# Patient Record
Sex: Female | Born: 1990 | Race: White | Hispanic: No | Marital: Married | Smoking: Never smoker
Health system: Southern US, Community
[De-identification: ages and names within clinical notes are randomized; demographics above are authoritative.]

## PROBLEM LIST (undated history)

## (undated) DIAGNOSIS — K8050 Calculus of bile duct without cholangitis or cholecystitis without obstruction: Secondary | ICD-10-CM

## (undated) DIAGNOSIS — F419 Anxiety disorder, unspecified: Secondary | ICD-10-CM

## (undated) DIAGNOSIS — R55 Syncope and collapse: Secondary | ICD-10-CM

## (undated) HISTORY — PX: WISDOM TOOTH EXTRACTION: SHX21

## (undated) HISTORY — DX: Anxiety disorder, unspecified: F41.9

## (undated) HISTORY — DX: Calculus of bile duct without cholangitis or cholecystitis without obstruction: K80.50

## (undated) HISTORY — DX: Syncope and collapse: R55

---
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
## 2013-09-14 HISTORY — PX: INTRAUTERINE DEVICE (IUD) INSERTION: SHX5877

## 2016-02-05 DIAGNOSIS — B9789 Other viral agents as the cause of diseases classified elsewhere: Secondary | ICD-10-CM | POA: Diagnosis not present

## 2016-02-05 DIAGNOSIS — J069 Acute upper respiratory infection, unspecified: Secondary | ICD-10-CM | POA: Diagnosis not present

## 2016-02-05 DIAGNOSIS — J029 Acute pharyngitis, unspecified: Secondary | ICD-10-CM | POA: Diagnosis not present

## 2016-04-07 ENCOUNTER — Encounter: Payer: Self-pay | Admitting: Adult Health

## 2016-04-07 ENCOUNTER — Ambulatory Visit (INDEPENDENT_AMBULATORY_CARE_PROVIDER_SITE_OTHER): Payer: BLUE CROSS/BLUE SHIELD | Admitting: Adult Health

## 2016-04-07 VITALS — BP 99/67 | HR 100 | Ht 64.25 in | Wt 114.6 lb

## 2016-04-07 DIAGNOSIS — Z23 Encounter for immunization: Secondary | ICD-10-CM | POA: Diagnosis not present

## 2016-04-07 DIAGNOSIS — Z0000 Encounter for general adult medical examination without abnormal findings: Secondary | ICD-10-CM | POA: Insufficient documentation

## 2016-04-07 DIAGNOSIS — R55 Syncope and collapse: Secondary | ICD-10-CM | POA: Diagnosis not present

## 2016-04-07 NOTE — Assessment & Plan Note (Signed)
F/u with Cardiologist as directed. Has been on BB, was d/cd > 1 year ago. Continue am Gatorade and excellent water intake.

## 2016-04-07 NOTE — Patient Instructions (Signed)
Heart-Healthy Eating Plan Many factors influence your heart health, including eating and exercise habits. Heart (coronary) risk increases with abnormal blood fat (lipid) levels. Heart-healthy meal planning includes limiting unhealthy fats, increasing healthy fats, and making other small dietary changes. This includes maintaining a healthy body weight to help keep lipid levels within a normal range. What is my plan? Your health care provider recommends that you:  Get no more than _________% of the total calories in your daily diet from fat.  Limit your intake of saturated fat to less than _________% of your total calories each day.  Limit the amount of cholesterol in your diet to less than _________ mg per day. What types of fat should I choose?  Choose healthy fats more often. Choose monounsaturated and polyunsaturated fats, such as olive oil and canola oil, flaxseeds, walnuts, almonds, and seeds.  Eat more omega-3 fats. Good choices include salmon, mackerel, sardines, tuna, flaxseed oil, and ground flaxseeds. Aim to eat fish at least two times each week.  Limit saturated fats. Saturated fats are primarily found in animal products, such as meats, butter, and cream. Plant sources of saturated fats include palm oil, palm kernel oil, and coconut oil.  Avoid foods with partially hydrogenated oils in them. These contain trans fats. Examples of foods that contain trans fats are stick margarine, some tub margarines, cookies, crackers, and other baked goods. What general guidelines do I need to follow?  Check food labels carefully to identify foods with trans fats or high amounts of saturated fat.  Fill one half of your plate with vegetables and green salads. Eat 4-5 servings of vegetables per day. A serving of vegetables equals 1 cup of raw leafy vegetables,  cup of raw or cooked cut-up vegetables, or  cup of vegetable juice.  Fill one fourth of your plate with whole grains. Look for the word  "whole" as the first word in the ingredient list.  Fill one fourth of your plate with lean protein foods.  Eat 4-5 servings of fruit per day. A serving of fruit equals one medium whole fruit,  cup of dried fruit,  cup of fresh, frozen, or canned fruit, or  cup of 100% fruit juice.  Eat more foods that contain soluble fiber. Examples of foods that contain this type of fiber are apples, broccoli, carrots, beans, peas, and barley. Aim to get 20-30 g of fiber per day.  Eat more home-cooked food and less restaurant, buffet, and fast food.  Limit or avoid alcohol.  Limit foods that are high in starch and sugar.  Avoid fried foods.  Cook foods by using methods other than frying. Baking, boiling, grilling, and broiling are all great options. Other fat-reducing suggestions include:  Removing the skin from poultry.  Removing all visible fats from meats.  Skimming the fat off of stews, soups, and gravies before serving them.  Steaming vegetables in water or broth.  Lose weight if you are overweight. Losing just 5-10% of your initial body weight can help your overall health and prevent diseases such as diabetes and heart disease.  Increase your consumption of nuts, legumes, and seeds to 4-5 servings per week. One serving of dried beans or legumes equals  cup after being cooked, one serving of nuts equals 1 ounces, and one serving of seeds equals  ounce or 1 tablespoon.  You may need to monitor your salt (sodium) intake, especially if you have high blood pressure. Talk with your health care provider or dietitian to get more  more information about reducing sodium. °What foods can I eat? °Grains ° °Breads, including French, white, pita, wheat, raisin, rye, oatmeal, and Italian. Tortillas that are neither fried nor made with lard or trans fat. Low-fat rolls, including hotdog and hamburger buns and English muffins. Biscuits. Muffins. Waffles. Pancakes. Light popcorn. Whole-grain cereals. Flatbread.  Melba toast. Pretzels. Breadsticks. Rusks. Low-fat snacks and crackers, including oyster, saltine, matzo, graham, animal, and rye. Rice and pasta, including brown rice and those that are made with whole wheat. °Vegetables °All vegetables. °Fruits °All fruits, but limit coconut. °Meats and Other Protein Sources °Lean, well-trimmed beef, veal, pork, and lamb. Chicken and turkey without skin. All fish and shellfish. Wild duck, rabbit, pheasant, and venison. Egg whites or low-cholesterol egg substitutes. Dried beans, peas, lentils, and tofu. Seeds and most nuts. °Dairy °Low-fat or nonfat cheeses, including ricotta, string, and mozzarella. Skim or 1% milk that is liquid, powdered, or evaporated. Buttermilk that is made with low-fat milk. Nonfat or low-fat yogurt. °Beverages °Mineral water. Diet carbonated beverages. °Sweets and Desserts °Sherbets and fruit ices. Honey, jam, marmalade, jelly, and syrups. Meringues and gelatins. Pure sugar candy, such as hard candy, jelly beans, gumdrops, mints, marshmallows, and small amounts of dark chocolate. Angel food cake. °Eat all sweets and desserts in moderation. °Fats and Oils °Nonhydrogenated (trans-free) margarines. Vegetable oils, including soybean, sesame, sunflower, olive, peanut, safflower, corn, canola, and cottonseed. Salad dressings or mayonnaise that are made with a vegetable oil. Limit added fats and oils that you use for cooking, baking, salads, and as spreads. °Other °Cocoa powder. Coffee and tea. All seasonings and condiments. °The items listed above may not be a complete list of recommended foods or beverages. Contact your dietitian for more options. °What foods are not recommended? °Grains °Breads that are made with saturated or trans fats, oils, or whole milk. Croissants. Butter rolls. Cheese breads. Sweet rolls. Donuts. Buttered popcorn. Chow mein noodles. High-fat crackers, such as cheese or butter crackers. °Meats and Other Protein Sources °Fatty meats, such  as hotdogs, short ribs, sausage, spareribs, bacon, ribeye roast or steak, and mutton. High-fat deli meats, such as salami and bologna. Caviar. Domestic duck and goose. Organ meats, such as kidney, liver, sweetbreads, brains, gizzard, chitterlings, and heart. °Dairy °Cream, sour cream, cream cheese, and creamed cottage cheese. Whole milk cheeses, including blue (bleu), Monterey Jack, Brie, [place], American, Havarti, Swiss, cheddar, Camembert, and [place]. Whole or 2% milk that is liquid, evaporated, or condensed. Whole buttermilk. Cream sauce or high-fat cheese sauce. Yogurt that is made from whole milk. °Beverages °Regular sodas and drinks with added sugar. °Sweets and Desserts °Frosting. Pudding. Cookies. Cakes other than angel food cake. Candy that has milk chocolate or white chocolate, hydrogenated fat, butter, coconut, or unknown ingredients. Buttered syrups. Full-fat ice cream or ice cream drinks. °Fats and Oils °Gravy that has suet, meat fat, or shortening. Cocoa butter, hydrogenated oils, palm oil, coconut oil, palm kernel oil. These can often be found in baked products, candy, fried foods, nondairy creamers, and whipped toppings. Solid fats and shortenings, including bacon fat, salt pork, lard, and butter. Nondairy cream substitutes, such as coffee creamers and sour cream substitutes. Salad dressings that are made of unknown oils, cheese, or sour cream. °The items listed above may not be a complete list of foods and beverages to avoid. Contact your dietitian for more information. °This information is not intended to replace advice given to you by your health care provider. Make sure you discuss any questions you have with your health care   health care provider. Make sure you discuss any questions you have with your health care provider. Document Released: 11/10/2007 Document Revised: 08/21/2015 Document Reviewed: 07/25/2013 Elsevier Interactive Patient Education  2017 [employer]  Please schedule fasting labs at your convenience. Annual follow-ups, or sooner if needed.

## 2016-04-07 NOTE — Progress Notes (Signed)
Subjective:    Patient ID: Courtney Barnett, female    DOB: 1990/03/31, 26 y.o.   MRN: [id number]  HPI:  Ms. Evelene CroonSantos is here to establish as a new pt.  She is a pleasant, healthy 26 year old.  Only medical hx of vasovagal syndrome, last occurrence was > 1year ago.  Her Cardiologist stopped her BB and she manages condition with am Gatorade and excellent water intake throughout the day.  She denies any current acute complaints at time of encounter.   Patient Care Team    Relationship Specialty Notifications Start End  Malon KindleKaty D Bess, NP PCP - General Family Medicine  04/07/16     Patient Active Problem List   Diagnosis Date Noted  . 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 Healthcare maintenance 04/07/2016     Past Medical History:  Diagnosis Date  . Vasovagal syncope      Past Surgical History:  Procedure Laterality Date  . WISDOM TOOTH EXTRACTION       Family History  Problem Relation Age of Onset  . Healthy Mother   . Healthy Father   . Healthy Brother   . Diabetes Maternal Grandfather   . Healthy Brother   . Healthy Brother      History  Drug Use No     History  Alcohol Use No     History  Smoking Status  . Never Smoker  Smokeless Tobacco  . Never Used     Outpatient Encounter Prescriptions as of 04/07/2016  Medication Sig  . levonorgestrel (MIRENA) 20 MCG/24HR IUD 1 each by Intrauterine route once.   No facility-administered encounter medications on file as of 04/07/2016.     Allergies: Patient has no known allergies.  Body mass index is 19.52 kg/m.  Blood pressure 99/67, pulse 100, height 5' 4.25" (1.632 m), weight 114 lb 9.6 oz (52 kg).     Review of Systems  Constitutional: Negative for activity change, appetite change, chills, diaphoresis, fatigue, fever and unexpected weight change.  HENT: Negative for congestion.   Respiratory: Negative for cough and shortness of breath.   Cardiovascular: Negative for chest pain, palpitations and leg swelling.   Gastrointestinal: Negative for abdominal distention, abdominal pain, blood in stool, constipation, diarrhea, nausea and vomiting.  Endocrine: Negative for cold intolerance, heat intolerance, polydipsia, polyphagia and polyuria.  Genitourinary: Negative for difficulty urinating and flank pain.  Musculoskeletal: Negative for arthralgias, back pain, gait problem, joint swelling, myalgias, neck pain and neck stiffness.  Skin: Negative for color change, pallor, rash and wound.  Neurological: Negative for dizziness, tremors, seizures, syncope, speech difficulty, weakness, light-headedness and headaches.  Psychiatric/Behavioral: Negative for agitation and self-injury. The patient is not nervous/anxious.        Objective:   Physical Exam  Constitutional: She is oriented to person, place, and time. She appears well-developed and well-nourished. No distress.  HENT:  Head: Normocephalic and atraumatic.  Right Ear: External ear normal.  Left Ear: External ear normal.  Eyes: Conjunctivae and EOM are normal. Pupils are equal, round, and reactive to light.  Neck: Normal range of motion. Neck supple.  Cardiovascular: Normal rate, regular rhythm, normal heart sounds and intact distal pulses.   No murmur heard. Pulmonary/Chest: Effort normal and breath sounds normal. No respiratory distress. She has no wheezes. She has no rales. She exhibits no tenderness.  Abdominal: Soft. Bowel sounds are normal. She exhibits no mass. There is no tenderness. There is no rebound and no guarding.  Musculoskeletal: Normal range of motion. She  exhibits no edema, tenderness or deformity.  Lymphadenopathy:    She has no cervical adenopathy.  Neurological: She is alert and oriented to person, place, and time. She has normal reflexes.  Skin: Skin is warm and dry. No rash noted. She is not diaphoretic. No erythema. No pallor.  Psychiatric: She has a normal mood and affect. Her behavior is normal. Judgment and thought content  normal.  Nursing note and vitals reviewed.         Assessment & Plan:   1. Need for influenza vaccination   2. Vasovagal syndrome   3. Healthcare maintenance     Healthcare maintenance Continue excellent diet, regular exercise and healthcare maintenance.  Please schedule fasting labs at your convenience.  Vasovagal syndrome F/u with Cardiologist as directed. Has been on BB, was d/cd > 1 year ago. Continue am Gatorade and excellent water intake.     FOLLOW-UP:  Return in about 1 year (around 04/07/2017) for Regular Follow Up.

## 2016-04-07 NOTE — Assessment & Plan Note (Signed)
Continue excellent diet, regular exercise and healthcare maintenance.  Please schedule fasting labs at your convenience.

## 2016-04-12 ENCOUNTER — Other Ambulatory Visit (INDEPENDENT_AMBULATORY_CARE_PROVIDER_SITE_OTHER): Payer: BLUE CROSS/BLUE SHIELD

## 2016-04-12 DIAGNOSIS — R55 Syncope and collapse: Secondary | ICD-10-CM

## 2016-04-12 DIAGNOSIS — Z0000 Encounter for general adult medical examination without abnormal findings: Secondary | ICD-10-CM

## 2016-04-13 ENCOUNTER — Other Ambulatory Visit: Payer: Self-pay | Admitting: Adult Health

## 2016-04-13 LAB — CBC WITH DIFFERENTIAL/PLATELET
BASOS: 1 %
Basophils Absolute: 0 10*3/uL (ref 0.0–0.2)
EOS (ABSOLUTE): 0.1 10*3/uL (ref 0.0–0.4)
EOS: 2 %
HEMOGLOBIN: 13 g/dL (ref 11.1–15.9)
Hematocrit: 39.2 % (ref 34.0–46.6)
IMMATURE GRANS (ABS): 0 10*3/uL (ref 0.0–0.1)
IMMATURE GRANULOCYTES: 0 %
Lymphocytes Absolute: 1.2 10*3/uL (ref 0.7–3.1)
Lymphs: 28 %
MCH: 29 pg (ref 26.6–33.0)
MCHC: 33.2 g/dL (ref 31.5–35.7)
MCV: 88 fL (ref 79–97)
MONOS ABS: 0.2 10*3/uL (ref 0.1–0.9)
Monocytes: 5 %
NEUTROS PCT: 64 %
Neutrophils Absolute: 2.8 10*3/uL (ref 1.4–7.0)
Platelets: 292 10*3/uL (ref 150–379)
RBC: 4.48 x10E6/uL (ref 3.77–5.28)
RDW: 12.6 % (ref 12.3–15.4)
WBC: 4.3 10*3/uL (ref 3.4–10.8)

## 2016-04-13 LAB — COMPREHENSIVE METABOLIC PANEL
A/G RATIO: 2.1 (ref 1.2–2.2)
ALBUMIN: 4.9 g/dL (ref 3.5–5.5)
ALT: 14 IU/L (ref 0–32)
AST: 16 IU/L (ref 0–40)
Alkaline Phosphatase: 60 IU/L (ref 39–117)
BUN/Creatinine Ratio: 11 (ref 9–23)
BUN: 9 mg/dL (ref 6–20)
Bilirubin Total: 2.1 mg/dL — ABNORMAL HIGH (ref 0.0–1.2)
CALCIUM: 10.4 mg/dL — AB (ref 8.7–10.2)
CO2: 25 mmol/L (ref 18–29)
Chloride: 101 mmol/L (ref 96–106)
Creatinine, Ser: 0.85 mg/dL (ref 0.57–1.00)
GFR, EST AFRICAN AMERICAN: 111 mL/min/{1.73_m2} (ref >59)
GFR, EST NON AFRICAN AMERICAN: 96 mL/min/{1.73_m2} (ref >59)
GLOBULIN, TOTAL: 2.3 g/dL (ref 1.5–4.5)
Glucose: 93 mg/dL (ref 65–99)
POTASSIUM: 5 mmol/L (ref 3.5–5.2)
SODIUM: 142 mmol/L (ref 134–144)
TOTAL PROTEIN: 7.2 g/dL (ref 6.0–8.5)

## 2016-04-13 LAB — LIPID PANEL
Chol/HDL Ratio: 2.2 ratio units (ref 0.0–4.4)
Cholesterol, Total: 164 mg/dL (ref 100–199)
HDL: 73 mg/dL (ref >39)
LDL CALC: 80 mg/dL (ref 0–99)
Triglycerides: 56 mg/dL (ref 0–149)
VLDL CHOLESTEROL CAL: 11 mg/dL (ref 5–40)

## 2016-04-13 LAB — VITAMIN D 25 HYDROXY (VIT D DEFICIENCY, FRACTURES): VIT D 25 HYDROXY: 27.4 ng/mL — AB (ref 30.0–100.0)

## 2016-04-13 LAB — TSH: TSH: 1.18 u[IU]/mL (ref 0.450–4.500)

## 2016-04-13 LAB — HEMOGLOBIN A1C
Est. average glucose Bld gHb Est-mCnc: 97 mg/dL
Hgb A1c MFr Bld: 5 % (ref 4.8–5.6)

## 2016-04-13 MED ORDER — VITAMIN D (ERGOCALCIFEROL) 1.25 MG (50000 UNIT) PO CAPS: 50000.0000 [IU] | ORAL_CAPSULE | ORAL | 0 refills | Status: DC

## 2016-04-13 NOTE — Progress Notes (Unsigned)
ergo 

## 2016-05-17 ENCOUNTER — Telehealth: Payer: Self-pay | Admitting: Adult Health

## 2016-05-17 NOTE — Telephone Encounter (Signed)
Judieth came by the office and gave her [facility] bill to [place].  I called [facility] at [phone] and spoke with Danesha.  I added another code (Z13.21), screening for Vitamin D and ask them to refile the claim.  They will refile the claim and Maisha needs to allow 4 weeks for reprocessing.

## 2016-12-10 ENCOUNTER — Encounter: Payer: Self-pay | Admitting: Emergency Medicine

## 2016-12-10 ENCOUNTER — Emergency Department
Admission: EM | Admit: 2016-12-10 | Discharge: 2016-12-10 | Disposition: A | Discharge status: Home / Self Care | Payer: BLUE CROSS/BLUE SHIELD | Attending: Emergency Medicine | Admitting: Emergency Medicine

## 2016-12-10 ENCOUNTER — Emergency Department: Payer: BLUE CROSS/BLUE SHIELD

## 2016-12-10 DIAGNOSIS — R42 Dizziness and giddiness: Secondary | ICD-10-CM | POA: Insufficient documentation

## 2016-12-10 DIAGNOSIS — R1084 Generalized abdominal pain: Secondary | ICD-10-CM | POA: Diagnosis not present

## 2016-12-10 DIAGNOSIS — K8050 Calculus of bile duct without cholangitis or cholecystitis without obstruction: Secondary | ICD-10-CM | POA: Diagnosis not present

## 2016-12-10 DIAGNOSIS — R1110 Vomiting, unspecified: Secondary | ICD-10-CM | POA: Diagnosis not present

## 2016-12-10 DIAGNOSIS — K8020 Calculus of gallbladder without cholecystitis without obstruction: Secondary | ICD-10-CM | POA: Diagnosis not present

## 2016-12-10 LAB — URINALYSIS, COMPLETE (UACMP) WITH MICROSCOPIC
Bilirubin Urine: NEGATIVE
Glucose, UA: NEGATIVE mg/dL
Hgb urine dipstick: NEGATIVE
Ketones, ur: 20 mg/dL — AB
Nitrite: NEGATIVE
Protein, ur: NEGATIVE mg/dL
SPECIFIC GRAVITY, URINE: 1.008 (ref 1.005–1.030)
pH: 6 (ref 5.0–8.0)

## 2016-12-10 LAB — COMPREHENSIVE METABOLIC PANEL
ALBUMIN: 4.7 g/dL (ref 3.5–5.0)
ALT: 15 U/L (ref 14–54)
AST: 20 U/L (ref 15–41)
Alkaline Phosphatase: 42 U/L (ref 38–126)
Anion gap: 13 (ref 5–15)
BILIRUBIN TOTAL: 4.5 mg/dL — AB (ref 0.3–1.2)
BUN: 16 mg/dL (ref 6–20)
CHLORIDE: 101 mmol/L (ref 101–111)
CO2: 22 mmol/L (ref 22–32)
CREATININE: 0.67 mg/dL (ref 0.44–1.00)
Calcium: 9.3 mg/dL (ref 8.9–10.3)
GFR calc Af Amer: >60 mL/min (ref >60)
GFR calc non Af Amer: >60 mL/min (ref >60)
GLUCOSE: 107 mg/dL — AB (ref 65–99)
POTASSIUM: 3.5 mmol/L (ref 3.5–5.1)
Sodium: 136 mmol/L (ref 135–145)
Total Protein: 7.3 g/dL (ref 6.5–8.1)

## 2016-12-10 LAB — CBC
HCT: 40.4 % (ref 35.0–47.0)
Hemoglobin: 13.9 g/dL (ref 12.0–16.0)
MCH: 29.6 pg (ref 26.0–34.0)
MCHC: 34.4 g/dL (ref 32.0–36.0)
MCV: 86.2 fL (ref 80.0–100.0)
Platelets: 269 10*3/uL (ref 150–440)
RBC: 4.69 MIL/uL (ref 3.80–5.20)
RDW: 12.6 % (ref 11.5–14.5)
WBC: 8.2 10*3/uL (ref 3.6–11.0)

## 2016-12-10 LAB — LIPASE, BLOOD: Lipase: 26 U/L (ref 11–51)

## 2016-12-10 LAB — PREGNANCY, URINE: PREG TEST UR: NEGATIVE

## 2016-12-10 LAB — HCG, QUANTITATIVE, PREGNANCY: HCG, BETA CHAIN, QUANT, S: 3 m[IU]/mL (ref <5)

## 2016-12-10 MED ORDER — IBUPROFEN 600 MG PO TABS: 600.0000 mg | ORAL_TABLET | Freq: Three times a day (TID) | ORAL | 0 refills | Status: DC | PRN

## 2016-12-10 MED ORDER — ONDANSETRON 4 MG PO TBDP: 4.0000 mg | ORAL_TABLET | Freq: Three times a day (TID) | ORAL | 0 refills | Status: DC | PRN

## 2016-12-10 MED ORDER — DICYCLOMINE HCL 20 MG PO TABS: 20.0000 mg | ORAL_TABLET | Freq: Three times a day (TID) | ORAL | 0 refills | Status: DC | PRN

## 2016-12-10 MED ORDER — KETOROLAC TROMETHAMINE 30 MG/ML IJ SOLN
15.0000 mg | Freq: Once | INTRAMUSCULAR | Status: AC
  Administered 2016-12-10: 15 mg via INTRAVENOUS
  Filled 2016-12-10: qty 1

## 2016-12-10 MED ORDER — ONDANSETRON HCL 4 MG/2ML IJ SOLN
4.0000 mg | Freq: Once | INTRAMUSCULAR | Status: AC | PRN
  Administered 2016-12-10: 4 mg via INTRAVENOUS

## 2016-12-10 MED ORDER — HYDROCODONE-ACETAMINOPHEN 5-325 MG PO TABS: 1.0000 | ORAL_TABLET | Freq: Four times a day (QID) | ORAL | 0 refills | Status: DC | PRN

## 2016-12-10 MED ORDER — ONDANSETRON HCL 4 MG/2ML IJ SOLN: 4.0000 mg | Freq: Once | INTRAMUSCULAR | Status: DC | PRN

## 2016-12-10 MED ORDER — IOPAMIDOL (ISOVUE-300) INJECTION 61%
15.0000 mL | INTRAVENOUS | Status: AC
  Administered 2016-12-10: 30 mL via ORAL
  Administered 2016-12-10: 15 mL via ORAL

## 2016-12-10 MED ORDER — DICYCLOMINE HCL 10 MG PO CAPS
20.0000 mg | ORAL_CAPSULE | Freq: Once | ORAL | Status: AC
  Administered 2016-12-10: 20 mg via ORAL
  Filled 2016-12-10: qty 2

## 2016-12-10 MED ORDER — IOPAMIDOL (ISOVUE-300) INJECTION 61%
75.0000 mL | Freq: Once | INTRAVENOUS | Status: AC | PRN
  Administered 2016-12-10: 75 mL via INTRAVENOUS

## 2016-12-10 MED ORDER — SODIUM CHLORIDE 0.9 % IV BOLUS (SEPSIS)
1000.0000 mL | Freq: Once | INTRAVENOUS | Status: AC
  Administered 2016-12-10: 1000 mL via INTRAVENOUS

## 2016-12-10 MED ORDER — ONDANSETRON HCL 4 MG/2ML IJ SOLN
INTRAMUSCULAR | Status: AC
  Administered 2016-12-10: 4 mg via INTRAVENOUS
  Filled 2016-12-10: qty 2

## 2016-12-10 MED ORDER — MORPHINE SULFATE (PF) 4 MG/ML IV SOLN
4.0000 mg | Freq: Once | INTRAVENOUS | Status: AC
  Administered 2016-12-10: 4 mg via INTRAVENOUS
  Filled 2016-12-10: qty 1

## 2016-12-10 NOTE — ED Provider Notes (Signed)
[facility] Emergency Department Provider Note  ____________________________________________   First MD Initiated Contact with Patient 12/10/16 [phone]     (approximate)  I have reviewed the triage vital signs and the nursing notes.   HISTORY  Chief Complaint Emesis and Abdominal Pain   HPI Courtney Barnett is a 26 y.o. female who self presents to the emergency department with roughly 24 hours of diffuse abdominal pain.  Her pain is moderate severity cramping aching and seems to be somewhat in her upper abdomen but also progressing and radiating down to her right lower quadrant.  2 days prior to her pain she began to have generalized malaise and "discomfort" throughout her abdomen.  She has had some nausea and has vomited several times.  No diarrhea.  No fevers or chills.  She has no abdominal surgical history.  Her last menstrual period was 2 weeks ago.  No dysuria frequency or hesitancy.   Past Medical History:  Diagnosis Date  . Vasovagal syncope     Patient Active Problem List   Diagnosis Date Noted  . 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 Healthcare maintenance 04/07/2016    Past Surgical History:  Procedure Laterality Date  . WISDOM TOOTH EXTRACTION      Prior to Admission medications   Medication Sig Start Date End Date Taking? Authorizing Provider  dicyclomine (BENTYL) 20 MG tablet Take 1 tablet (20 mg total) by mouth 3 (three) times daily as needed for spasms. 12/10/16 12/10/17  Merrily Brittle, MD  HYDROcodone-acetaminophen (NORCO) 5-325 MG tablet Take 1 tablet by mouth every 6 (six) hours as needed for severe pain. 12/10/16   Merrily Brittle, MD  ibuprofen (ADVIL,MOTRIN) 600 MG tablet Take 1 tablet (600 mg total) by mouth every 8 (eight) hours as needed. 12/10/16   Merrily Brittle, MD  levonorgestrel (MIRENA) 20 MCG/24HR IUD 1 each by Intrauterine route once.    [provider]  ondansetron (ZOFRAN ODT) 4 MG disintegrating tablet Take  1 tablet (4 mg total) by mouth every 8 (eight) hours as needed for nausea or vomiting. 12/10/16   Merrily Brittle, MD  Vitamin D, Ergocalciferol, (DRISDOL) 50000 units CAPS capsule Take 1 capsule (50,000 Units total) by mouth every 7 (seven) days. Patient not taking: Reported on 12/10/2016 04/13/16   Julaine Fusi, NP    Allergies Patient has no known allergies.  Family History  Problem Relation Age of Onset  . Healthy Mother   . Healthy Father   . Healthy Brother   . Diabetes Maternal Grandfather   . Healthy Brother   . Healthy Brother     Social History Social History  Substance Use Topics  . Smoking status: Never Smoker  . Smokeless tobacco: Never Used  . Alcohol use No    Review of Systems Constitutional: No fever/chills Eyes: No visual changes. ENT: No sore throat. Cardiovascular: Denies chest pain. Respiratory: Denies shortness of breath. Gastrointestinal: Positive for abdominal pain.  Positive for nausea, positive for vomiting.  No diarrhea.  No constipation. Genitourinary: Negative for dysuria. Musculoskeletal: Negative for back pain. Skin: Negative for rash. Neurological: Negative for headaches, focal weakness or numbness.  ____________________________________________   PHYSICAL EXAM:  VITAL SIGNS: ED Triage Vitals  Enc Vitals Group     BP 12/10/16 0903 107/65     Pulse Rate 12/10/16 0903 (!) 110     Resp 12/10/16 0903 20     Temp 12/10/16 0903 98.1 F (36.7 C)     Temp Source 12/10/16 0903 Oral  SpO2 12/10/16 0903 99 %     Weight 12/10/16 0904 111 lb (50.3 kg)     Height 12/10/16 0904 5\' 3"  (1.6 m)     Head Circumference --      Peak Flow --      Pain Score 12/10/16 0903 9     Pain Loc --      Pain Edu? --      Excl. in GC? --     Constitutional: Alert and oriented x4 appears somewhat uncomfortable nontoxic no diaphoresis speaks full clear sentences Eyes: PERRL EOMI. Head: Atraumatic. Nose: No congestion/rhinnorhea. Mouth/Throat: No  trismus Neck: No stridor.   Cardiovascular: Tachycardic rate, regular rhythm. Grossly normal heart sounds.  Good peripheral circulation. Respiratory: Normal respiratory effort.  No retractions. Lungs CTAB and moving good air Gastrointestinal: Soft abdomen no frank peritonitis she is tender in her right lower quadrant although with no rebound no guarding Musculoskeletal: No lower extremity edema   Neurologic:  Normal speech and language. No gross focal neurologic deficits are appreciated. Skin:  Skin is warm, dry and intact. No rash noted. Psychiatric: Mood and affect are normal. Speech and behavior are normal.    ____________________________________________   DIFFERENTIAL includes but not limited to  Appendicitis, diverticulitis, ectopic pregnancy, ovarian cyst ____________________________________________   LABS (all labs ordered are listed, but only abnormal results are displayed)  Labs Reviewed  COMPREHENSIVE METABOLIC PANEL - Abnormal; Notable for the following:       Result Value   Glucose, Bld 107 (*)    Total Bilirubin 4.5 (*)    All other components within normal limits  URINALYSIS, COMPLETE (UACMP) WITH MICROSCOPIC - Abnormal; Notable for the following:    Color, Urine YELLOW (*)    APPearance CLOUDY (*)    Ketones, ur 20 (*)    Leukocytes, UA MODERATE (*)    Bacteria, UA FEW (*)    Squamous Epithelial / LPF 6-30 (*)    All other components within normal limits  LIPASE, BLOOD  CBC  HCG, QUANTITATIVE, PREGNANCY  PREGNANCY, URINE    Blood work reviewed and interpreted by me shows bacteriuria but is a dirty sample and no other acute disease noted __________________________________________  EKG   ____________________________________________  RADIOLOGY  CT scan abdomen pelvis reviewed by me shows gallstones but no other acute disease ____________________________________________   PROCEDURES  Procedure(s) performed: no  Procedures  Critical Care  performed: no  Observation: no ____________________________________________   INITIAL IMPRESSION / ASSESSMENT AND PLAN / ED COURSE  Pertinent labs & imaging results that were available during my care of the patient were reviewed by me and considered in my medical decision making (see chart for details).       ----------------------------------------- 2:51 PM on 12/10/2016 -----------------------------------------  After Toradol and Bentyl the patient's pain is improved and she is able to eat and drink without difficulty.  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  I had a lengthy discussion with the patient and her husband at bedside regarding the possible progression of this disease onto cholecystitis and strict return precautions have been given.  Patient verbalizes understanding and agreement with plan. ____________________________________________   FINAL CLINICAL IMPRESSION(S) / ED DIAGNOSES  Final diagnoses:  Biliary colic      NEW MEDICATIONS STARTED DURING THIS VISIT:  New Prescriptions   DICYCLOMINE (BENTYL) 20 MG TABLET    Take 1 tablet (20 mg total) by mouth 3 (three) times daily  as needed for spasms.   HYDROCODONE-ACETAMINOPHEN (NORCO) 5-325 MG TABLET    Take 1 tablet by mouth every 6 (six) hours as needed for severe pain.   IBUPROFEN (ADVIL,MOTRIN) 600 MG TABLET    Take 1 tablet (600 mg total) by mouth every 8 (eight) hours as needed.   ONDANSETRON (ZOFRAN ODT) 4 MG DISINTEGRATING TABLET    Take 1 tablet (4 mg total) by mouth every 8 (eight) hours as needed for nausea or vomiting.     Note:  This document was prepared using Dragon voice recognition software and may include unintentional dictation errors.     Merrily Brittleifenbark, Keyante Durio, MD 12/10/16 1452

## 2016-12-10 NOTE — Discharge Instructions (Signed)
Take your pain medication as needed for severe symptoms and make an appointment to follow-up with the general surgeon for reevaluation.  Return to the emergency department sooner for any new or worsening symptoms such as fevers, chills, if you cannot eat or drink, or for any other concerns whatsoever.  It was a pleasure to take care of you today, and thank you for coming to our emergency department.  If you have any questions or concerns before leaving please ask the nurse to grab me and I'm more than happy to go through your aftercare instructions again.  If you were prescribed any opioid pain medication today such as Norco, Vicodin, Percocet, morphine, hydrocodone, or oxycodone please make sure you do not drive when you are taking this medication as it can alter your ability to drive safely.  If you have any concerns once you are home that you are not improving or are in fact getting worse before you can make it to your follow-up appointment, please do not hesitate to call 911 and come back for further evaluation.  Merrily Brittle, MD  Results for orders placed or performed during the hospital encounter of 12/10/16  Lipase, blood  Result Value Ref Range   Lipase 26 11 - 51 U/L  Comprehensive metabolic panel  Result Value Ref Range   Sodium 136 135 - 145 mmol/L   Potassium 3.5 3.5 - 5.1 mmol/L   Chloride 101 101 - 111 mmol/L   CO2 22 22 - 32 mmol/L   Glucose, Bld 107 (H) 65 - 99 mg/dL   BUN 16 6 - 20 mg/dL   Creatinine, Ser 1.61 0.44 - 1.00 mg/dL   Calcium 9.3 8.9 - 09.6 mg/dL   Total Protein 7.3 6.5 - 8.1 g/dL   Albumin 4.7 3.5 - 5.0 g/dL   AST 20 15 - 41 U/L   ALT 15 14 - 54 U/L   Alkaline Phosphatase 42 38 - 126 U/L   Total Bilirubin 4.5 (H) 0.3 - 1.2 mg/dL   GFR calc non Af Amer >60 >60 mL/min   GFR calc Af Amer >60 >60 mL/min   Anion gap 13 5 - 15  CBC  Result Value Ref Range   WBC 8.2 3.6 - 11.0 K/uL   RBC 4.69 3.80 - 5.20 MIL/uL   Hemoglobin 13.9 12.0 - 16.0 g/dL   HCT  04.5 40.9 - 81.1 %   MCV 86.2 80.0 - 100.0 fL   MCH 29.6 26.0 - 34.0 pg   MCHC 34.4 32.0 - 36.0 g/dL   RDW 91.4 78.2 - 95.6 %   Platelets 269 150 - 440 K/uL  Urinalysis, Complete w Microscopic  Result Value Ref Range   Color, Urine YELLOW (A) YELLOW   APPearance CLOUDY (A) CLEAR   Specific Gravity, Urine 1.008 1.005 - 1.030   pH 6.0 5.0 - 8.0   Glucose, UA NEGATIVE NEGATIVE mg/dL   Hgb urine dipstick NEGATIVE NEGATIVE   Bilirubin Urine NEGATIVE NEGATIVE   Ketones, ur 20 (A) NEGATIVE mg/dL   Protein, ur NEGATIVE NEGATIVE mg/dL   Nitrite NEGATIVE NEGATIVE   Leukocytes, UA MODERATE (A) NEGATIVE   RBC / HPF 0-5 0 - 5 RBC/hpf   WBC, UA 6-30 0 - 5 WBC/hpf   Bacteria, UA FEW (A) NONE SEEN   Squamous Epithelial / LPF 6-30 (A) NONE SEEN   Mucus PRESENT   hCG, quantitative, pregnancy  Result Value Ref Range   hCG, Beta Chain, Quant, S 3 <5 mIU/mL  Pregnancy,  urine  Result Value Ref Range   Preg Test, Ur NEGATIVE NEGATIVE   Ct Abdomen Pelvis W Contrast  Result Date: 12/10/2016 CLINICAL DATA:  States nausea, vomiting and diffuse abd pain since yesterday. Also states dizziness with history of vasovagal syncope EXAM: CT ABDOMEN AND PELVIS WITH CONTRAST TECHNIQUE: Multidetector CT imaging of the abdomen and pelvis was performed using the standard protocol following bolus administration of intravenous contrast. CONTRAST:  75mL ISOVUE-300 IOPAMIDOL (ISOVUE-300) INJECTION 61% COMPARISON:  None. FINDINGS: Lower chest: No acute abnormality. Hepatobiliary: The the liver is homogeneous without focal mass. Small calcified stones are identified within the dependent aspect of the gallbladder. Pancreas: Unremarkable. No pancreatic ductal dilatation or surrounding inflammatory changes. Spleen: Normal in size without focal abnormality. Adrenals/Urinary Tract: Adrenal glands are normal in appearance. Kidneys are normal in appearance. No hydronephrosis. Urinary bladder is normal in appearance. Stomach/Bowel: The  stomach and small bowel loops are normal in appearance. Large bowel loops are normal in appearance. No bowel obstruction or abscess. Vascular/Lymphatic: No significant vascular findings are present. No enlarged abdominal or pelvic lymph nodes. Reproductive: Uterus is present, containing intrauterine device in the central canal in expected location. No adnexal mass. Other: Small amount of free pelvic fluid is likely physiologic. Anterior abdominal wall is unremarkable. Musculoskeletal: No acute or significant osseous findings. IMPRESSION: 1. No acute abnormality of the abdomen or pelvis. 2. Intrauterine device in the expected location. Electronically Signed   By: Norva PavlovElizabeth  Brown M.D.   On: 12/10/2016 12:56

## 2016-12-10 NOTE — ED Notes (Signed)
Patient was assisted to the room commode. Patient had steady gait, but was unsteady initially when standing.

## 2016-12-10 NOTE — ED Notes (Signed)
Pt family visually seen walking into room

## 2016-12-10 NOTE — ED Triage Notes (Signed)
States nausea, vomiting and abd pain since yesterday.

## 2016-12-10 NOTE — ED Notes (Signed)
Patient is dressed, waiting for her husband to arrive to take her home.

## 2016-12-13 DIAGNOSIS — K8050 Calculus of bile duct without cholangitis or cholecystitis without obstruction: Secondary | ICD-10-CM

## 2016-12-13 HISTORY — DX: Calculus of bile duct without cholangitis or cholecystitis without obstruction: K80.50

## 2016-12-16 ENCOUNTER — Ambulatory Visit (INDEPENDENT_AMBULATORY_CARE_PROVIDER_SITE_OTHER): Payer: BLUE CROSS/BLUE SHIELD | Admitting: Surgery

## 2016-12-16 ENCOUNTER — Other Ambulatory Visit
Admission: RE | Admit: 2016-12-16 | Discharge: 2016-12-16 | Disposition: A | Discharge status: Home / Self Care | Payer: BLUE CROSS/BLUE SHIELD | Source: Ambulatory Visit | Attending: Surgery | Admitting: Surgery

## 2016-12-16 ENCOUNTER — Encounter: Payer: Self-pay | Admitting: Surgery

## 2016-12-16 ENCOUNTER — Telehealth: Payer: Self-pay

## 2016-12-16 ENCOUNTER — Encounter
Admission: RE | Admit: 2016-12-16 | Discharge: 2016-12-16 | Disposition: A | Discharge status: Home / Self Care | Payer: BLUE CROSS/BLUE SHIELD | Source: Ambulatory Visit | Attending: Surgery | Admitting: Surgery

## 2016-12-16 ENCOUNTER — Telehealth: Payer: Self-pay | Admitting: Surgery

## 2016-12-16 VITALS — BP 96/68 | HR 88 | Temp 98.4°F | Ht 65.0 in | Wt 110.8 lb

## 2016-12-16 DIAGNOSIS — K8000 Calculus of gallbladder with acute cholecystitis without obstruction: Secondary | ICD-10-CM

## 2016-12-16 DIAGNOSIS — K8050 Calculus of bile duct without cholangitis or cholecystitis without obstruction: Secondary | ICD-10-CM | POA: Diagnosis not present

## 2016-12-16 DIAGNOSIS — R1084 Generalized abdominal pain: Secondary | ICD-10-CM | POA: Insufficient documentation

## 2016-12-16 LAB — HEPATIC FUNCTION PANEL
ALBUMIN: 4.6 g/dL (ref 3.5–5.0)
ALK PHOS: 40 U/L (ref 38–126)
ALT: 16 U/L (ref 14–54)
AST: 20 U/L (ref 15–41)
Bilirubin, Direct: 0.2 mg/dL (ref 0.1–0.5)
Indirect Bilirubin: 2.7 mg/dL — ABNORMAL HIGH (ref 0.3–0.9)
TOTAL PROTEIN: 7.2 g/dL (ref 6.5–8.1)
Total Bilirubin: 2.9 mg/dL — ABNORMAL HIGH (ref 0.3–1.2)

## 2016-12-16 NOTE — H&P (View-Only) (Signed)
Surgical Clinic History and Physical  Referring provider:  Julaine Fusianford, Katy D, NP [address] [place], [place] [id number]  HISTORY OF PRESENT ILLNESS (HPI):  26 y.o. female presents for evaluation of symptomatic cholelithiasis. Patient reports severe RUQ and epigastric abdominal pain along with nausea and subsequent emesis x 48 hours one week ago that began when she awoke Friday morning after eating [employer] cheeseburgers and fries the prior night. Her symptoms then persisted until she presented [facility] ED the following day, at which time Zofran and NPO/IVF with pain medication helped resolve her symptoms. Because she was felt to have RLQ tenderness on ED physician's exam, CT was performed. Patient also reports one similar episode a year earlier and multiple episodes of post-prandial RUQ abdominal pain following fatty or greasy foods. Patient otherwise denies CP or SOB.  PAST MEDICAL HISTORY (PMH):  Past Medical History:  Diagnosis Date  . Biliary colic 12/13/2016  . Vasovagal syncope      PAST SURGICAL HISTORY (PSH):  Past Surgical History:  Procedure Laterality Date  . WISDOM TOOTH EXTRACTION       MEDICATIONS:  Prior to Admission medications   Medication Sig Start Date End Date Taking? Authorizing Provider  dicyclomine (BENTYL) 20 MG tablet Take 1 tablet (20 mg total) by mouth 3 (three) times daily as needed for spasms. 12/10/16 12/10/17 Yes Merrily Brittleifenbark, Neil, MD  ibuprofen (ADVIL,MOTRIN) 600 MG tablet Take 1 tablet (600 mg total) by mouth every 8 (eight) hours as needed. 12/10/16  Yes Merrily Brittleifenbark, Neil, MD  levonorgestrel (MIRENA) 20 MCG/24HR IUD 1 each by Intrauterine route once.   Yes [provider]  ondansetron (ZOFRAN ODT) 4 MG disintegrating tablet Take 1 tablet (4 mg total) by mouth every 8 (eight) hours as needed for nausea or vomiting. 12/10/16  Yes Merrily Brittleifenbark, Neil, MD     ALLERGIES:  No Known Allergies   SOCIAL HISTORY:  Social History   Social History  .  Marital status: Married    Spouse name: N/A  . Number of children: N/A  . Years of education: N/A   Occupational History  . Not on file.   Social History Main Topics  . Smoking status: Never Smoker  . Smokeless tobacco: Never Used  . Alcohol use No  . Drug use: No  . Sexual activity: Yes    Birth control/ protection: IUD   Other Topics Concern  . Not on file   Social History Narrative  . No narrative on file    The patient currently resides (home / rehab facility / nursing home): Home The patient normally is (ambulatory / bedbound): Ambulatory  FAMILY HISTORY:  Family History  Problem Relation Age of Onset  . Healthy Mother   . Healthy Father   . Healthy Brother   . Diabetes Maternal Grandfather   . Healthy Brother   . Healthy Brother     Otherwise negative/non-contributory.  REVIEW OF SYSTEMS:  Constitutional: denies any other weight loss, fever, chills, or sweats  Eyes: denies any other vision changes, history of eye injury  ENT: denies sore throat, hearing problems  Respiratory: denies shortness of breath, wheezing  Cardiovascular: denies chest pain, palpitations  Gastrointestinal: abdominal pain, N/V, and bowel function as per HPI Musculoskeletal: denies any other joint pains or cramps  Skin: Denies any other rashes or skin discolorations Neurological: denies any other headache, dizziness, weakness  Psychiatric: Denies any other depression, anxiety   All other review of systems were otherwise negative   VITAL SIGNS:  @VSRANGES @  Height: 5\' 5"  (165.1 cm) Weight: 110 lb 12.8 oz (50.3 kg) BMI (Calculated): 18.44   PHYSICAL EXAM:  Constitutional:  -- Normal body habitus  -- Awake, alert, and oriented x3  Eyes:  -- Pupils equally round and reactive to light  -- No scleral icterus  Ear, nose, throat:  -- No jugular venous distension -- No nasal drainage, bleeding Pulmonary:  -- No crackles  -- Equal breath sounds bilaterally -- Breathing non-labored  at rest Cardiovascular:  -- S1, S2 present  -- No pericardial rubs  Gastrointestinal:  -- Abdomen soft, nontender, non-distended, no guarding/rebound  -- No abdominal masses appreciated, pulsatile or otherwise  Musculoskeletal and Integumentary:  -- Wounds or skin discoloration: None appreciated -- Extremities: B/L UE and LE FROM, hands and feet warm, no edema  Neurologic:  -- Motor function: Intact and symmetric -- Sensation: Intact and symmetric  Labs:  CBC Latest Ref Rng & Units 12/10/2016 04/12/2016  WBC 3.6 - 11.0 K/uL 8.2 4.3  Hemoglobin 12.0 - 16.0 g/dL 96.0 45.4  Hematocrit 09.8 - 47.0 % 40.4 39.2  Platelets 150 - 440 K/uL 269 292   CMP Latest Ref Rng & Units 12/10/2016 04/12/2016  Glucose 65 - 99 mg/dL 119(J) 93  BUN 6 - 20 mg/dL 16 9  Creatinine 4.78 - 1.00 mg/dL 2.95 6.21  Sodium 308 - 145 mmol/L 136 142  Potassium 3.5 - 5.1 mmol/L 3.5 5.0  Chloride 101 - 111 mmol/L 101 101  CO2 22 - 32 mmol/L 22 25  Calcium 8.9 - 10.3 mg/dL 9.3 10.4(H)  Total Protein 6.5 - 8.1 g/dL 7.3 7.2  Total Bilirubin 0.3 - 1.2 mg/dL 4.5(H) 2.1(H)  Alkaline Phos 38 - 126 U/L 42 60  AST 15 - 41 U/L 20 16  ALT 14 - 54 U/L 15 14    Imaging studies:  CT Abdomen and Pelvis with Contrast (12/10/2016) - personally reviewed and discussed with patient and her husband Cholelithiasis without CT evidence for acute cholecystitis. The liver is homogeneous without focal mass. Small calcified stones are identified within the dependent aspect of the gallbladder.  Assessment/Plan:  26 y.o. female with likely symptomatic cholelithiasis, possible resolved choledocholithiasis considering acute elevation of baseline elevated bilirubin (2.1 in 03/2016 and 4.5 during recent ED presentation), complicated by co-morbidities including vasovagal syncope/vertigo.   - will check repeat LFT's today   - minimize/avoid foods with more fat (meat, cheeses/dairy, and fried foods)   - prefer instead vegetables, fruits, and  grains (ceareals, breads, etc)   - all risks, benefits, and alternatives to cholecystectomy were discussed with the patient and her husband, all of their questions were answered to their expressed satisfaction, patient expresses she wishes to proceed, and informed consent was obtained.  - pending LFT's, will plan for elective laparoscopic cholecystectomy next Tuesday, 11/6  - outpatient post-surgical follow-up 2 weeks after above elective procedure  - instructed to call if any questions or concerns  All of the above recommendations were discussed with the patient and patient's family, and all of patient's and family's questions were answered to their expressed satisfaction.  Thank you for the opportunity to participate in this patient's care.  -- Scherrie Gerlach Earlene Plater, MD, RPVI [place]: [facility] - Partnering for exceptional care. Office: [phone]

## 2016-12-16 NOTE — Progress Notes (Signed)
Surgical Clinic History and Physical  Referring provider:  Julaine Fusianford, Katy D, NP [address] [place], [place] [id number]  HISTORY OF PRESENT ILLNESS (HPI):  26 y.o. female presents for evaluation of symptomatic cholelithiasis. Patient reports severe RUQ and epigastric abdominal pain along with nausea and subsequent emesis x 48 hours one week ago that began when she awoke Friday morning after eating [employer] cheeseburgers and fries the prior night. Her symptoms then persisted until she presented to [facility] ED the following day, at which time Zofran and NPO/IVF with pain medication helped resolve her symptoms. Because she was felt to have RLQ tenderness on ED physician's exam, CT was performed. Patient also reports one similar episode a year earlier and multiple episodes of post-prandial RUQ abdominal pain following fatty or greasy foods. Patient otherwise denies CP or SOB.  PAST MEDICAL HISTORY (PMH):  Past Medical History:  Diagnosis Date  . Biliary colic 12/13/2016  . Vasovagal syncope      PAST SURGICAL HISTORY (PSH):  Past Surgical History:  Procedure Laterality Date  . WISDOM TOOTH EXTRACTION       MEDICATIONS:  Prior to Admission medications   Medication Sig Start Date End Date Taking? Authorizing Provider  dicyclomine (BENTYL) 20 MG tablet Take 1 tablet (20 mg total) by mouth 3 (three) times daily as needed for spasms. 12/10/16 12/10/17 Yes Merrily Brittleifenbark, Neil, MD  ibuprofen (ADVIL,MOTRIN) 600 MG tablet Take 1 tablet (600 mg total) by mouth every 8 (eight) hours as needed. 12/10/16  Yes Merrily Brittleifenbark, Neil, MD  levonorgestrel (MIRENA) 20 MCG/24HR IUD 1 each by Intrauterine route once.   Yes [provider]  ondansetron (ZOFRAN ODT) 4 MG disintegrating tablet Take 1 tablet (4 mg total) by mouth every 8 (eight) hours as needed for nausea or vomiting. 12/10/16  Yes Merrily Brittleifenbark, Neil, MD     ALLERGIES:  No Known Allergies   SOCIAL HISTORY:  Social History   Social History  .  Marital status: Married    Spouse name: N/A  . Number of children: N/A  . Years of education: N/A   Occupational History  . Not on file.   Social History Main Topics  . Smoking status: Never Smoker  . Smokeless tobacco: Never Used  . Alcohol use No  . Drug use: No  . Sexual activity: Yes    Birth control/ protection: IUD   Other Topics Concern  . Not on file   Social History Narrative  . No narrative on file    The patient currently resides (home / rehab facility / nursing home): Home The patient normally is (ambulatory / bedbound): Ambulatory  FAMILY HISTORY:  Family History  Problem Relation Age of Onset  . Healthy Mother   . Healthy Father   . Healthy Brother   . Diabetes Maternal Grandfather   . Healthy Brother   . Healthy Brother     Otherwise negative/non-contributory.  REVIEW OF SYSTEMS:  Constitutional: denies any other weight loss, fever, chills, or sweats  Eyes: denies any other vision changes, history of eye injury  ENT: denies sore throat, hearing problems  Respiratory: denies shortness of breath, wheezing  Cardiovascular: denies chest pain, palpitations  Gastrointestinal: abdominal pain, N/V, and bowel function as per HPI Musculoskeletal: denies any other joint pains or cramps  Skin: Denies any other rashes or skin discolorations Neurological: denies any other headache, dizziness, weakness  Psychiatric: Denies any other depression, anxiety   All other review of systems were otherwise negative   VITAL SIGNS:  @VSRANGES @  Height: 5\' 5"  (165.1 cm) Weight: 110 lb 12.8 oz (50.3 kg) BMI (Calculated): 18.44   PHYSICAL EXAM:  Constitutional:  -- Normal body habitus  -- Awake, alert, and oriented x3  Eyes:  -- Pupils equally round and reactive to light  -- No scleral icterus  Ear, nose, throat:  -- No jugular venous distension -- No nasal drainage, bleeding Pulmonary:  -- No crackles  -- Equal breath sounds bilaterally -- Breathing non-labored  at rest Cardiovascular:  -- S1, S2 present  -- No pericardial rubs  Gastrointestinal:  -- Abdomen soft, nontender, non-distended, no guarding/rebound  -- No abdominal masses appreciated, pulsatile or otherwise  Musculoskeletal and Integumentary:  -- Wounds or skin discoloration: None appreciated -- Extremities: B/L UE and LE FROM, hands and feet warm, no edema  Neurologic:  -- Motor function: Intact and symmetric -- Sensation: Intact and symmetric  Labs:  CBC Latest Ref Rng & Units 12/10/2016 04/12/2016  WBC 3.6 - 11.0 K/uL 8.2 4.3  Hemoglobin 12.0 - 16.0 g/dL 96.0 45.4  Hematocrit 09.8 - 47.0 % 40.4 39.2  Platelets 150 - 440 K/uL 269 292   CMP Latest Ref Rng & Units 12/10/2016 04/12/2016  Glucose 65 - 99 mg/dL 119(J) 93  BUN 6 - 20 mg/dL 16 9  Creatinine 4.78 - 1.00 mg/dL 2.95 6.21  Sodium 308 - 145 mmol/L 136 142  Potassium 3.5 - 5.1 mmol/L 3.5 5.0  Chloride 101 - 111 mmol/L 101 101  CO2 22 - 32 mmol/L 22 25  Calcium 8.9 - 10.3 mg/dL 9.3 10.4(H)  Total Protein 6.5 - 8.1 g/dL 7.3 7.2  Total Bilirubin 0.3 - 1.2 mg/dL 4.5(H) 2.1(H)  Alkaline Phos 38 - 126 U/L 42 60  AST 15 - 41 U/L 20 16  ALT 14 - 54 U/L 15 14    Imaging studies:  CT Abdomen and Pelvis with Contrast (12/10/2016) - personally reviewed and discussed with patient and her husband Cholelithiasis without CT evidence for acute cholecystitis. The liver is homogeneous without focal mass. Small calcified stones are identified within the dependent aspect of the gallbladder.  Assessment/Plan:  26 y.o. female with likely symptomatic cholelithiasis, possible resolved choledocholithiasis considering acute elevation of baseline elevated bilirubin (2.1 in 03/2016 and 4.5 during recent ED presentation), complicated by co-morbidities including vasovagal syncope/vertigo.   - will check repeat LFT's today   - minimize/avoid foods with more fat (meat, cheeses/dairy, and fried foods)   - prefer instead vegetables, fruits, and  grains (ceareals, breads, etc)   - all risks, benefits, and alternatives to cholecystectomy were discussed with the patient and her husband, all of their questions were answered to their expressed satisfaction, patient expresses she wishes to proceed, and informed consent was obtained.  - pending LFT's, will plan for elective laparoscopic cholecystectomy next Tuesday, 11/6  - outpatient post-surgical follow-up 2 weeks after above elective procedure  - instructed to call if any questions or concerns  All of the above recommendations were discussed with the patient and patient's family, and all of patient's and family's questions were answered to their expressed satisfaction.  Thank you for the opportunity to participate in this patient's care.  -- Scherrie Gerlach Earlene Plater, MD, RPVI [place]: [facility] General [facility] - Partnering for exceptional care. Office: [phone]

## 2016-12-16 NOTE — Patient Instructions (Signed)
Your procedure is scheduled on: 12/20/16 Tues Report to Same Day Surgery 2nd floor medical mall [facility](Medical Mall Entrance-take elevator on left to 2nd floor.  Check in with surgery information desk.) To find out your arrival time please call [phone] between 1PM - 3PM on 12/19/16 Mon  Remember: Instructions that are not followed completely may result in serious medical risk, up to and including death, or upon the discretion of your surgeon and anesthesiologist your surgery may need to be rescheduled.    _x___ 1. Do not eat food after midnight the night before your procedure. You may drink clear liquids up to 2 hours before you are scheduled to arrive at the hospital for your procedure.  Do not drink clear liquids within 2 hours of your scheduled arrival to the hospital.  Clear liquids include  --Water or Apple juice without pulp  --Clear carbohydrate beverage such as ClearFast or Gatorade  --Black Coffee or Clear Tea (No milk, no creamers, do not add anything to                  the coffee or Tea Type 1 and type 2 diabetics should only drink water.  No gum chewing or hard candies.     __x__ 2. No Alcohol for 24 hours before or after surgery.   __x__3. No Smoking for 24 prior to surgery.   ____  4. Bring all medications with you on the day of surgery if instructed.    __x__ 5. Notify your doctor if there is any change in your medical condition     (cold, fever, infections).     Do not wear jewelry, make-up, hairpins, clips or nail polish.  Do not wear lotions, powders, or perfumes. You may wear deodorant.  Do not shave 48 hours prior to surgery. Men may shave face and neck.  Do not bring valuables to the hospital.    [facility] is not responsible for any belongings or valuables.               Contacts, dentures or bridgework may not be worn into surgery.  Leave your suitcase in the car. After surgery it may be brought to your room.  For patients admitted to the hospital, discharge  time is determined by your                       treatment team.   Patients discharged the day of surgery will not be allowed to drive home.  You will need someone to drive you home and stay with you the night of your procedure.    Please read over the following fact sheets that you were given:   [facility] Preparing for Surgery and or MRSA Information   _x___ Take anti-hypertensive listed below, cardiac, seizure, asthma,     anti-reflux and psychiatric medicines. These include:  1. None  2.  3.  4.  5.  6.  ____Fleets enema or Magnesium Citrate as directed.   ____ Use CHG Soap or sage wipes as directed on instruction sheet   ____ Use inhalers on the day of surgery and bring to hospital day of surgery  ____ Stop Metformin and Janumet 2 days prior to surgery.    ____ Take 1/2 of usual insulin dose the night before surgery and none on the morning     surgery.   _x___ Follow recommendations from Cardiologist, Pulmonologist or PCP regarding  stopping Aspirin, Coumadin, Plavix ,Eliquis, Effient, or Pradaxa, and Pletal.  X____Stop Anti-inflammatories such as Advil, Aleve, Ibuprofen, Motrin, Naproxen, Naprosyn, Goodies powders or aspirin products. OK to take Tylenol and                          Celebrex.   _x___ Stop supplements until after surgery.  But may continue Vitamin D, Vitamin B,       and multivitamin.   ____ Bring C-Pap to the hospital.

## 2016-12-16 NOTE — Telephone Encounter (Signed)
Pt advised of pre op date/time and sx date. Sx: 12/20/16 with Dr Mamie Laurelavis-Laparoscopic cholecystectomy.  Pre op: 12/16/16 between 1-5:00pm--Phone interview.   Patient made aware to call [phone], between 1-3:00pm the day before surgery, to find out what time to arrive.

## 2016-12-16 NOTE — Telephone Encounter (Signed)
Spoke with Dr.Davis regarding Labs- proceed as plan with surgery.   Patient notified and verbalized understanding.

## 2016-12-16 NOTE — Patient Instructions (Signed)
You have requested to have your gallbladder removed. This will be done on 12/20/16 at [facility] with Dr. Earlene Plateravis..  You will most likely be out of work 1-2 weeks for this surgery. You will return after your post-op appointment with a lifting restriction for approximately 4 more weeks.  You will be able to eat anything you would like to following surgery. But, start by eating a bland diet and advance this as tolerated. The Gallbladder diet is below, please go as closely by this diet as possible prior to surgery to avoid any further attacks.  Please see the (blue)pre-care form that you have been given today. If you have any questions, please call our office.  Laparoscopic Cholecystectomy Laparoscopic cholecystectomy is surgery to remove the gallbladder. The gallbladder is located in the upper right part of the abdomen, behind the liver. It is a storage sac for bile, which is produced in the liver. Bile aids in the digestion and absorption of fats. Cholecystectomy is often done for inflammation of the gallbladder (cholecystitis). This condition is usually caused by a buildup of gallstones (cholelithiasis) in the gallbladder. Gallstones can block the flow of bile, and that can result in inflammation and pain. In severe cases, emergency surgery may be required. If emergency surgery is not required, you will have time to prepare for the procedure. Laparoscopic surgery is an alternative to open surgery. Laparoscopic surgery has a shorter recovery time. Your common bile duct may also need to be examined during the procedure. If stones are found in the common bile duct, they may be removed. LET [facility] KNOW ABOUT:  Any allergies you have.  All medicines you are taking, including vitamins, herbs, eye drops, creams, and over-the-counter medicines.  Previous problems you or members of your family have had with the use of anesthetics.  Any blood disorders you have.  Previous surgeries  you have had.    Any medical conditions you have. RISKS AND COMPLICATIONS Generally, this is a safe procedure. However, problems may occur, including:  Infection.  Bleeding.  Allergic reactions to medicines.  Damage to other structures or organs.  A stone remaining in the common bile duct.  A bile leak from the cyst duct that is clipped when your gallbladder is removed.  The need to convert to open surgery, which requires a larger incision in the abdomen. This may be necessary if your surgeon thinks that it is not safe to continue with a laparoscopic procedure. BEFORE THE PROCEDURE  Ask your health care provider about:  Changing or stopping your regular medicines. This is especially important if you are taking diabetes medicines or blood thinners.  Taking medicines such as aspirin and ibuprofen. These medicines can thin your blood. Do not take these medicines before your procedure if your health care provider instructs you not to.  Follow instructions from your health care provider about eating or drinking restrictions.  Let your health care provider know if you develop a cold or an infection before surgery.  Plan to have someone take you home after the procedure.  Ask your health care provider how your surgical site will be marked or identified.  You may be given antibiotic medicine to help prevent infection. PROCEDURE  To reduce your risk of infection:  Your health care team will wash or sanitize their hands.  Your skin will be washed with soap.  An IV tube may be inserted into one of your veins.  You will be given a medicine to make  you fall asleep (general anesthetic).  A breathing tube will be placed in your mouth.  The surgeon will make several small cuts (incisions) in your abdomen.  A thin, lighted tube (laparoscope) that has a tiny camera on the end will be inserted through one of the small incisions. The camera on the laparoscope will send a picture to  a TV screen (monitor) in the operating room. This will give the surgeon a good view inside your abdomen.  A gas will be pumped into your abdomen. This will expand your abdomen to give the surgeon more room to perform the surgery.  Other tools that are needed for the procedure will be inserted through the other incisions. The gallbladder will be removed through one of the incisions.  After your gallbladder has been removed, the incisions will be closed with stitches (sutures), staples, or skin glue.  Your incisions may be covered with a bandage (dressing). The procedure may vary among health care providers and hospitals. AFTER THE PROCEDURE  Your blood pressure, heart rate, breathing rate, and blood oxygen level will be monitored often until the medicines you were given have worn off.  You will be given medicines as needed to control your pain.   This information is not intended to replace advice given to you by your health care provider. Make sure you discuss any questions you have with your health care provider.   Document Released: 01/31/2005 Document Revised: 10/22/2014 Document Reviewed: 09/12/2012 Elsevier Interactive Patient Education 2016 [place] Diet for Gallbladder Conditions A low-fat diet can be helpful if you have pancreatitis or a gallbladder condition. With these conditions, your pancreas and gallbladder have trouble digesting fats. A healthy eating plan with less fat will help rest your pancreas and gallbladder and reduce your symptoms. WHAT DO I NEED TO KNOW ABOUT THIS DIET?  Eat a low-fat diet.  Reduce your fat intake to less than 20-30% of your total daily calories. This is less than 50-60 g of fat per day.  Remember that you need some fat in your diet. Ask your dietician what your daily goal should be.  Choose nonfat and low-fat healthy foods. Look for the words "nonfat," "low fat," or "fat free."  As a guide, look on the label and choose foods  with less than 3 g of fat per serving. Eat only one serving.  Avoid alcohol.  Do not smoke. If you need help quitting, talk with your health care provider.  Eat small frequent meals instead of three large heavy meals. WHAT FOODS CAN I EAT? Grains Include healthy grains and starches such as potatoes, wheat bread, fiber-rich cereal, and brown rice. Choose whole grain options whenever possible. In adults, whole grains should account for 45-65% of your daily calories.  Fruits and Vegetables Eat plenty of fruits and vegetables. Fresh fruits and vegetables add fiber to your diet. Meats and Other Protein Sources Eat lean meat such as chicken and pork. Trim any fat off of meat before cooking it. Eggs, fish, and beans are other sources of protein. In adults, these foods should account for 10-35% of your daily calories. Dairy Choose low-fat milk and dairy options. Dairy includes fat and protein, as well as calcium.  Fats and Oils Limit high-fat foods such as fried foods, sweets, baked goods, sugary drinks.  Other Creamy sauces and condiments, such as mayonnaise, can add extra fat. Think about whether or not you need to use them, or use smaller amounts or low fat options.  WHAT FOODS ARE NOT RECOMMENDED?  High fat foods, such as:  [employer].  Ice cream.  [place] toast.  Sweet rolls.  Pizza.  Cheese bread.  Foods covered with batter, butter, creamy sauces, or cheese.  Fried foods.  Sugary drinks and desserts.  Foods that cause gas or bloating   This information is not intended to replace advice given to you by your health care provider. Make sure you discuss any questions you have with your health care provider.   Document Released: 02/05/2013 Document Reviewed: 02/05/2013 Elsevier Interactive Patient Education [employer]       We need for you to go to the lab today for blood work.

## 2016-12-19 MED ORDER — CEFAZOLIN SODIUM-DEXTROSE 2-4 GM/100ML-% IV SOLN
2.0000 g | INTRAVENOUS | Status: AC
  Administered 2016-12-20: 2 g via INTRAVENOUS

## 2016-12-20 ENCOUNTER — Ambulatory Visit
Admission: RE | Admit: 2016-12-20 | Discharge: 2016-12-20 | Disposition: A | Discharge status: Home / Self Care | Payer: BLUE CROSS/BLUE SHIELD | Source: Ambulatory Visit | Attending: Surgery | Admitting: Surgery

## 2016-12-20 ENCOUNTER — Ambulatory Visit: Payer: BLUE CROSS/BLUE SHIELD | Admitting: Anesthesiology

## 2016-12-20 ENCOUNTER — Ambulatory Visit: Payer: BLUE CROSS/BLUE SHIELD

## 2016-12-20 ENCOUNTER — Encounter: Payer: Self-pay | Admitting: *Deleted

## 2016-12-20 ENCOUNTER — Other Ambulatory Visit: Payer: Self-pay

## 2016-12-20 ENCOUNTER — Encounter
Admission: RE | Disposition: A | Discharge status: Home / Self Care | Payer: Self-pay | Source: Ambulatory Visit | Attending: Surgery

## 2016-12-20 DIAGNOSIS — Z79899 Other long term (current) drug therapy: Secondary | ICD-10-CM | POA: Insufficient documentation

## 2016-12-20 DIAGNOSIS — K8064 Calculus of gallbladder and bile duct with chronic cholecystitis without obstruction: Secondary | ICD-10-CM | POA: Insufficient documentation

## 2016-12-20 DIAGNOSIS — J449 Chronic obstructive pulmonary disease, unspecified: Secondary | ICD-10-CM | POA: Diagnosis not present

## 2016-12-20 DIAGNOSIS — Z419 Encounter for procedure for purposes other than remedying health state, unspecified: Secondary | ICD-10-CM

## 2016-12-20 DIAGNOSIS — K8050 Calculus of bile duct without cholangitis or cholecystitis without obstruction: Secondary | ICD-10-CM | POA: Diagnosis not present

## 2016-12-20 DIAGNOSIS — K8020 Calculus of gallbladder without cholecystitis without obstruction: Secondary | ICD-10-CM

## 2016-12-20 HISTORY — PX: CHOLECYSTECTOMY: SHX55

## 2016-12-20 LAB — POCT PREGNANCY, URINE: PREG TEST UR: NEGATIVE

## 2016-12-20 SURGERY — LAPAROSCOPIC CHOLECYSTECTOMY WITH INTRAOPERATIVE CHOLANGIOGRAM
Anesthesia: General

## 2016-12-20 MED ORDER — PROPOFOL 10 MG/ML IV BOLUS
INTRAVENOUS | Status: DC | PRN
Start: 2016-12-20 — End: 2016-12-20
  Administered 2016-12-20: 120 mg via INTRAVENOUS

## 2016-12-20 MED ORDER — SUGAMMADEX SODIUM 200 MG/2ML IV SOLN
INTRAVENOUS | Status: DC | PRN
  Administered 2016-12-20: 100 mg via INTRAVENOUS

## 2016-12-20 MED ORDER — LIDOCAINE HCL (PF) 2 % IJ SOLN
INTRAMUSCULAR | Status: AC
  Filled 2016-12-20: qty 10

## 2016-12-20 MED ORDER — MIDAZOLAM HCL 2 MG/2ML IJ SOLN
INTRAMUSCULAR | Status: DC | PRN
  Administered 2016-12-20: 2 mg via INTRAVENOUS

## 2016-12-20 MED ORDER — CHLORHEXIDINE GLUCONATE CLOTH 2 % EX PADS: 6.0000 | MEDICATED_PAD | Freq: Once | CUTANEOUS | Status: DC

## 2016-12-20 MED ORDER — PROPOFOL 10 MG/ML IV BOLUS
INTRAVENOUS | Status: AC
  Filled 2016-12-20: qty 20

## 2016-12-20 MED ORDER — PHENYLEPHRINE HCL 10 MG/ML IJ SOLN
INTRAMUSCULAR | Status: AC
  Filled 2016-12-20: qty 1

## 2016-12-20 MED ORDER — ROCURONIUM BROMIDE 50 MG/5ML IV SOLN
INTRAVENOUS | Status: AC
  Filled 2016-12-20: qty 1

## 2016-12-20 MED ORDER — GLYCOPYRROLATE 0.2 MG/ML IJ SOLN
INTRAMUSCULAR | Status: AC
  Filled 2016-12-20: qty 1

## 2016-12-20 MED ORDER — SODIUM CHLORIDE FLUSH 0.9 % IV SOLN
INTRAVENOUS | Status: AC
  Filled 2016-12-20: qty 10

## 2016-12-20 MED ORDER — KETOROLAC TROMETHAMINE 30 MG/ML IJ SOLN
INTRAMUSCULAR | Status: DC | PRN
  Administered 2016-12-20: 30 mg via INTRAVENOUS

## 2016-12-20 MED ORDER — WHITE PETROLATUM EX OINT
TOPICAL_OINTMENT | CUTANEOUS | Status: AC
  Filled 2016-12-20: qty 5

## 2016-12-20 MED ORDER — ROCURONIUM BROMIDE 100 MG/10ML IV SOLN
INTRAVENOUS | Status: DC | PRN
  Administered 2016-12-20: 25 mg via INTRAVENOUS
  Administered 2016-12-20: 50 mg via INTRAVENOUS
  Administered 2016-12-20: 15 mg via INTRAVENOUS

## 2016-12-20 MED ORDER — MIDAZOLAM HCL 2 MG/2ML IJ SOLN
INTRAMUSCULAR | Status: AC
  Filled 2016-12-20: qty 2

## 2016-12-20 MED ORDER — FENTANYL CITRATE (PF) 100 MCG/2ML IJ SOLN
25.0000 ug | INTRAMUSCULAR | Status: DC | PRN
  Administered 2016-12-20 (×5): 25 ug via INTRAVENOUS

## 2016-12-20 MED ORDER — FENTANYL CITRATE (PF) 100 MCG/2ML IJ SOLN
INTRAMUSCULAR | Status: AC
  Administered 2016-12-20: 25 ug via INTRAVENOUS
  Filled 2016-12-20: qty 2

## 2016-12-20 MED ORDER — SUGAMMADEX SODIUM 200 MG/2ML IV SOLN
INTRAVENOUS | Status: AC
  Filled 2016-12-20: qty 2

## 2016-12-20 MED ORDER — FENTANYL CITRATE (PF) 100 MCG/2ML IJ SOLN
INTRAMUSCULAR | Status: AC
  Filled 2016-12-20: qty 2

## 2016-12-20 MED ORDER — DEXAMETHASONE SODIUM PHOSPHATE 10 MG/ML IJ SOLN
INTRAMUSCULAR | Status: DC | PRN
  Administered 2016-12-20: 10 mg via INTRAVENOUS

## 2016-12-20 MED ORDER — ONDANSETRON HCL 4 MG/2ML IJ SOLN
INTRAMUSCULAR | Status: DC | PRN
  Administered 2016-12-20: 4 mg via INTRAVENOUS

## 2016-12-20 MED ORDER — LIDOCAINE HCL (PF) 1 % IJ SOLN
INTRAMUSCULAR | Status: AC
  Filled 2016-12-20: qty 30

## 2016-12-20 MED ORDER — PROMETHAZINE HCL 25 MG/ML IJ SOLN
INTRAMUSCULAR | Status: AC
  Administered 2016-12-20: 6.25 mg via INTRAVENOUS
  Filled 2016-12-20: qty 1

## 2016-12-20 MED ORDER — MEPERIDINE HCL 50 MG/ML IJ SOLN: 6.2500 mg | INTRAMUSCULAR | Status: DC | PRN

## 2016-12-20 MED ORDER — OXYCODONE HCL 5 MG PO TABS
ORAL_TABLET | ORAL | Status: AC
  Filled 2016-12-20: qty 1

## 2016-12-20 MED ORDER — LIDOCAINE HCL (CARDIAC) 20 MG/ML IV SOLN
INTRAVENOUS | Status: DC | PRN
  Administered 2016-12-20: 60 mg via INTRAVENOUS

## 2016-12-20 MED ORDER — BUPIVACAINE HCL (PF) 0.5 % IJ SOLN
INTRAMUSCULAR | Status: AC
  Filled 2016-12-20: qty 30

## 2016-12-20 MED ORDER — DEXAMETHASONE SODIUM PHOSPHATE 10 MG/ML IJ SOLN
INTRAMUSCULAR | Status: AC
  Filled 2016-12-20: qty 1

## 2016-12-20 MED ORDER — SUCCINYLCHOLINE CHLORIDE 20 MG/ML IJ SOLN
INTRAMUSCULAR | Status: AC
  Filled 2016-12-20: qty 1

## 2016-12-20 MED ORDER — KETOROLAC TROMETHAMINE 30 MG/ML IJ SOLN
INTRAMUSCULAR | Status: AC
  Filled 2016-12-20: qty 1

## 2016-12-20 MED ORDER — PROMETHAZINE HCL 25 MG/ML IJ SOLN
6.2500 mg | INTRAMUSCULAR | Status: AC | PRN
  Administered 2016-12-20 (×2): 6.25 mg via INTRAVENOUS

## 2016-12-20 MED ORDER — OXYCODONE-ACETAMINOPHEN 5-325 MG PO TABS: 1.0000 | ORAL_TABLET | ORAL | 0 refills | Status: DC | PRN

## 2016-12-20 MED ORDER — ONDANSETRON HCL 4 MG/2ML IJ SOLN
INTRAMUSCULAR | Status: AC
  Filled 2016-12-20: qty 2

## 2016-12-20 MED ORDER — LACTATED RINGERS IV SOLN
INTRAVENOUS | Status: DC
  Administered 2016-12-20 (×2): via INTRAVENOUS

## 2016-12-20 MED ORDER — OXYCODONE HCL 5 MG PO TABS
5.0000 mg | ORAL_TABLET | Freq: Once | ORAL | Status: AC | PRN
  Administered 2016-12-20: 5 mg via ORAL

## 2016-12-20 MED ORDER — PROMETHAZINE HCL 25 MG/ML IJ SOLN
12.5000 mg | Freq: Once | INTRAMUSCULAR | Status: AC
  Administered 2016-12-20: 12.5 mg via INTRAVENOUS

## 2016-12-20 MED ORDER — LIDOCAINE HCL 1 % IJ SOLN
INTRAMUSCULAR | Status: DC | PRN
  Administered 2016-12-20: 19 mL via SUBCUTANEOUS

## 2016-12-20 MED ORDER — FAMOTIDINE 20 MG PO TABS
20.0000 mg | ORAL_TABLET | Freq: Once | ORAL | Status: AC
  Administered 2016-12-20: 20 mg via ORAL

## 2016-12-20 MED ORDER — OXYCODONE HCL 5 MG/5ML PO SOLN: 5.0000 mg | Freq: Once | ORAL | Status: AC | PRN

## 2016-12-20 MED ORDER — EPHEDRINE SULFATE 50 MG/ML IJ SOLN
INTRAMUSCULAR | Status: AC
  Filled 2016-12-20: qty 1

## 2016-12-20 MED ORDER — FENTANYL CITRATE (PF) 100 MCG/2ML IJ SOLN
INTRAMUSCULAR | Status: DC | PRN
  Administered 2016-12-20: 25 ug via INTRAVENOUS
  Administered 2016-12-20: 75 ug via INTRAVENOUS

## 2016-12-20 SURGICAL SUPPLY — 35 items
APPLIER CLIP ROT 10 11.4 M/L (STAPLE) ×4
CATH REDDICK CHOLANGI 4FR 50CM (CATHETERS) ×4 IMPLANT
CHLORAPREP W/TINT 26ML (MISCELLANEOUS) ×4 IMPLANT
CLIP APPLIE ROT 10 11.4 M/L (STAPLE) ×2 IMPLANT
DECANTER SPIKE VIAL GLASS SM (MISCELLANEOUS) IMPLANT
DERMABOND ADVANCED (GAUZE/BANDAGES/DRESSINGS) ×2
DERMABOND ADVANCED .7 DNX12 (GAUZE/BANDAGES/DRESSINGS) ×2 IMPLANT
DEVICE PMI PUNCTURE CLOSURE (MISCELLANEOUS) ×4 IMPLANT
DRESSING SURGICEL FIBRLLR 1X2 (HEMOSTASIS) IMPLANT
DRSG SURGICEL FIBRILLAR 1X2 (HEMOSTASIS)
ELECT REM PT RETURN 9FT ADLT (ELECTROSURGICAL) ×4
ELECTRODE REM PT RTRN 9FT ADLT (ELECTROSURGICAL) ×2 IMPLANT
GLOVE BIO SURGEON STRL SZ7 (GLOVE) ×4 IMPLANT
GLOVE BIOGEL PI IND STRL 7.5 (GLOVE) ×2 IMPLANT
GLOVE BIOGEL PI INDICATOR 7.5 (GLOVE) ×2
GOWN STRL REUS W/ TWL LRG LVL3 (GOWN DISPOSABLE) ×6 IMPLANT
GOWN STRL REUS W/TWL LRG LVL3 (GOWN DISPOSABLE) ×6
IRRIGATION STRYKERFLOW (MISCELLANEOUS) IMPLANT
IRRIGATOR STRYKERFLOW (MISCELLANEOUS)
IV NS 1000ML (IV SOLUTION) ×2
IV NS 1000ML BAXH (IV SOLUTION) ×2 IMPLANT
KIT RM TURNOVER STRD PROC AR (KITS) ×4 IMPLANT
NEEDLE HYPO 22GX1.5 SAFETY (NEEDLE) ×4 IMPLANT
NEEDLE INSUFFLATION 14GA 120MM (NEEDLE) ×4 IMPLANT
NS IRRIG 1000ML POUR BTL (IV SOLUTION) ×4 IMPLANT
PACK LAP CHOLECYSTECTOMY (MISCELLANEOUS) ×4 IMPLANT
POUCH SPECIMEN RETRIEVAL 10MM (ENDOMECHANICALS) ×4 IMPLANT
SCISSORS METZENBAUM CVD 33 (INSTRUMENTS) IMPLANT
SLEEVE ENDOPATH XCEL 5M (ENDOMECHANICALS) ×8 IMPLANT
SUT MNCRL AB 4-0 PS2 18 (SUTURE) ×4 IMPLANT
SUT VICRYL 0 UR6 27IN ABS (SUTURE) ×4 IMPLANT
SUT VICRYL AB 3-0 FS1 BRD 27IN (SUTURE) ×4 IMPLANT
TROCAR XCEL NON-BLD 11X100MML (ENDOMECHANICALS) ×4 IMPLANT
TROCAR XCEL NON-BLD 5MMX100MML (ENDOMECHANICALS) ×4 IMPLANT
TUBING INSUFFLATION (TUBING) ×4 IMPLANT

## 2016-12-20 NOTE — Anesthesia Procedure Notes (Signed)
Procedure Name: Intubation Date/Time: 12/20/2016 7:39 AM Performed by: Darlyne Russian, CRNA Pre-anesthesia Checklist: Patient identified, Emergency Drugs available, Suction available, Patient being monitored and Timeout performed Patient Re-evaluated:Patient Re-evaluated prior to induction Oxygen Delivery Method: Circle system utilized Preoxygenation: Pre-oxygenation with 100% oxygen Induction Type: IV induction Ventilation: Mask ventilation without difficulty Laryngoscope Size: Mac and 3 Grade View: Grade I Tube type: Oral Tube size: 7.0 mm Number of attempts: 1 Airway Equipment and Method: Stylet Placement Confirmation: ETT inserted through vocal cords under direct vision,  positive ETCO2 and breath sounds checked- equal and bilateral Secured at: 21 cm Tube secured with: Tape Dental Injury: Teeth and Oropharynx as per pre-operative assessment

## 2016-12-20 NOTE — Op Note (Addendum)
SURGICAL OPERATIVE REPORT   DATE OF PROCEDURE: 12/20/2016  ATTENDING Surgeon(s): Ancil Linseyavis, Alyss Granato Evan, MD  ASSISTANTS: Lucendia HerrlichLaura Gardner, PA-S  ANESTHESIA: GETA  PRE-OPERATIVE DIAGNOSIS: Symptomatic Cholelithiasis (K80.20)  POST-OPERATIVE DIAGNOSIS: Symptomatic Cholelithiasis (K80.20)  PROCEDURE(S): (cpt's: 47563) 1.) Laparoscopic Cholecystectomy 2.) Intra-operative Cholangiogram  INTRAOPERATIVE FINDINGS: Minimal pericholecystic inflammation with proximal hepatic ducts, cystic duct, and common bile duct well-opacified with clear visualization of contrast draining into duodenum without any filling defect(s) appreciated  INTRAOPERATIVE FLUIDS: 600 mL crystalloid   ESTIMATED BLOOD LOSS: Minimal (<30 mL)   URINE OUTPUT: No foley  SPECIMENS: Gallbladder  IMPLANTS: None  DRAINS: None   COMPLICATIONS: None apparent   CONDITION AT COMPLETION: Hemodynamically stable and extubated  DISPOSITION: PACU   INDICATION(S) FOR PROCEDURE:  Patient is a 26 y.o. female who recently presented with post-prandial RUQ > epigastric abdominal pain after eating fatty foods in particular. Ultrasound suggested cholelithiasis without sonographic evidence of cholecystitis. All risks, benefits, and alternatives to above elective procedures were discussed with the patient, who elected to proceed, and informed consent was accordingly obtained at that time.   DETAILS OF PROCEDURE:  Patient was brought to the operating suite and appropriately identified. General anesthesia was administered along with peri-operative prophylactic IV antibiotics, and endotracheal intubation was performed by anesthesiologist, along with NG/OG tube for gastric decompression. In supine position, operative site was prepped and draped in usual sterile fashion, and following a brief time out, initial 5 mm incision was made in a natural skin crease just above the umbilicus. Fascia was then elevated, and a Verress needle was inserted and its  proper position confirmed using aspiration and saline meniscus test.  Upon insufflation of the abdominal cavity with carbon dioxide to a well-tolerated pressure of 12-15 mmHg, 5 mm peri-umbilical port followed by laparoscope were inserted and used to inspect the abdominal cavity and its contents with no injuries from insertion of the first trochar noted. Three additional trocars were inserted, one at the epigastric position (10 mm) and two along the Right costal margin (5 mm). The table was then placed in reverse Trendelenburg position with the Right side up. Filmy adhesions between the gallbladder and omentum/duodenum/transverse colon were lysed using combined blunt dissection and selective electrocautery. The apex/dome of the gallbladder was grasped with an atraumatic grasper passed through the lateral port and retracted apically over the liver. The infundibulum was also grasped and retracted, exposing Calot's triangle. The peritoneum overlying the gallbladder infundibulum was incised and dissected free of surrounding peritoneal attachments, revealing the cystic duct. Cystic ductotomy was then made using endoshear scissors, and cholangiocatheter was inserted into the cystic duct, through which cholangiogram was performed, revealing proximal hepatic ducts, cystic duct, and common bile duct well-opacified with clear visualization of contrast draining into duodenum without any filling defect(s) appreciated.  Cholangiocatheter was then removed, and the cystic duct and cystic artery were clipped twice on the patient side and once on the gallbladder specimen side close to the gallbladderThe gallbladder was then dissected from its peritoneal attachments to the liver using electrocautery, and the gallbladder was placed into a laparoscopic specimen bag and removed from the abdominal cavity via the epigastric port site. Hemostasis and secure placement of clips were confirmed, and intra-peritoneal cavity was inspected with  no additional findings. PMI laparoscopic fascial closure device was then used to re-approximate fascia at the 10 mm epigastric port site.  All ports were then removed under direct visualization, and abdominal cavity was desuflated. All port sites were irrigated/cleaned, additional local anesthetic was injected at  each incision, 3-0 Vicryl was used to re-approximate dermis at 10 mm port site(s), and subcuticular 4-0 Monocryl suture was used to re-approximate skin. Skin was then cleaned, dried, and sterile skin glue was applied. Patient was then safely able to be awakened, extubated, and transferred to PACU for post-operative monitoring and care.   I was present for all aspects of procedure, and there were no intra-operative complications apparent.

## 2016-12-20 NOTE — Anesthesia Post-op Follow-up Note (Signed)
Anesthesia QCDR form completed.        

## 2016-12-20 NOTE — Discharge Instructions (Addendum)
In addition to included general post-operative instructions for Laparoscopic Cholecystectomy,  Diet: Gradually resume home heart healthy diet.   Activity: No heavy lifting >20 pounds (children, pets, laundry, garbage) or strenuous activity until follow-up, but light activity and walking are encouraged. Do not drive or drink alcohol if taking narcotic pain medications.  Wound care: 2 days after surgery (Wednesday, 11/8), may shower/get incision wet with soapy water and pat dry (do not rub incisions), but no baths or submerging incision underwater until follow-up.   Medications: Resume all home medications. For mild to moderate pain: acetaminophen (Tylenol) or ibuprofen (if no kidney disease). Combining Tylenol with alcohol can substantially increase your risk of causing liver disease. Narcotic pain medications, if prescribed, can be used for severe pain, though may cause nausea, constipation, and drowsiness. Do not combine Tylenol and Percocet within a 6 hour period as Percocet contains Tylenol. If you do not need the narcotic pain medication, you do not need to fill the prescription.  Call office [phone]) at any time if any questions, worsening pain, fevers/chills, bleeding, drainage from incision site, or other concerns.  General Anesthesia, Adult, Care After These instructions provide you with information about caring for yourself after your procedure. Your health care provider may also give you more specific instructions. Your treatment has been planned according to current medical practices, but problems sometimes occur. Call your health care provider if you have any problems or questions after your procedure. What can I expect after the procedure? After the procedure, it is common to have:  Vomiting.  A sore throat.  Mental slowness.  It is common to feel:  Nauseous.  Cold or shivery.  Sleepy.  Tired.  Sore or achy, even in parts of your body where you did not have  surgery.  Follow these instructions at home: For at least 24 hours after the procedure:  Do not: ? Participate in activities where you could fall or become injured. ? Drive. ? Use heavy machinery. ? Drink alcohol. ? Take sleeping pills or medicines that cause drowsiness. ? Make important decisions or sign legal documents. ? Take care of children on your own.  Rest. Eating and drinking  If you vomit, drink water, juice, or soup when you can drink without vomiting.  Drink enough fluid to keep your urine clear or pale yellow.  Make sure you have little or no nausea before eating solid foods.  Follow the diet recommended by your health care provider. General instructions  Have a responsible adult stay with you until you are awake and alert.  Return to your normal activities as told by your health care provider. Ask your health care provider what activities are safe for you.  Take over-the-counter and prescription medicines only as told by your health care provider.  If you smoke, do not smoke without supervision.  Keep all follow-up visits as told by your health care provider. This is important. Contact a health care provider if:  You continue to have nausea or vomiting at home, and medicines are not helpful.  You cannot drink fluids or start eating again.  You cannot urinate after 8-12 hours.  You develop a skin rash.  You have fever.  You have increasing redness at the site of your procedure. Get help right away if:  You have difficulty breathing.  You have chest pain.  You have unexpected bleeding.  You feel that you are having a life-threatening or urgent problem. This information is not intended to replace advice given to  you by your health care provider. Make sure you discuss any questions you have with your health care provider. Document Released: 05/09/2000 Document Revised: 07/06/2015 Document Reviewed: 01/15/2015 Elsevier Interactive Patient Education   [employer]2018 Elsevier Inc.

## 2016-12-20 NOTE — Interval H&P Note (Signed)
History and Physical Interval Note:  12/20/2016 7:14 AM  Courtney Barnett  has presented today for surgery, with the diagnosis of biliary colic  The various methods of treatment have been discussed with the patient and family. After consideration of risks, benefits and other options for treatment, the patient has consented to  Procedure(s): LAPAROSCOPIC CHOLECYSTECTOMY (N/A) as a surgical intervention .  The patient's history has been reviewed, patient examined, no change in status, stable for surgery.  I have reviewed the patient's chart and labs.  Questions were answered to the patient's satisfaction.     Ancil LinseyJason Evan Adon Gehlhausen

## 2016-12-20 NOTE — Anesthesia Postprocedure Evaluation (Signed)
Anesthesia Post Note  Patient: Courtney Barnett  Procedure(s) Performed: LAPAROSCOPIC CHOLECYSTECTOMY WITH INTRAOPERATIVE CHOLANGIOGRAM  Patient location during evaluation: PACU Anesthesia Type: General Level of consciousness: awake and alert and oriented Pain management: pain level controlled Vital Signs Assessment: post-procedure vital signs reviewed and stable Respiratory status: spontaneous breathing, nonlabored ventilation and respiratory function stable Cardiovascular status: blood pressure returned to baseline and stable Postop Assessment: no signs of nausea or vomiting Anesthetic complications: no     Last Vitals:  Vitals:   12/20/16 0915 12/20/16 0930  BP: 107/67 107/66  Pulse: (!) 103 81  Resp: 20 16  Temp: 37 C   SpO2: 99% 100%    Last Pain:  Vitals:   12/20/16 0930  TempSrc:   PainSc: Asleep                 Kellsie Grindle

## 2016-12-20 NOTE — Anesthesia Preprocedure Evaluation (Addendum)
Anesthesia Evaluation  Patient identified by MRN, date of birth, ID band Patient awake    Reviewed: Allergy & Precautions, NPO status , Patient's Chart, lab work & pertinent test results  History of Anesthesia Complications Negative for: history of anesthetic complications  Airway Mallampati: I  TM Distance: >3 FB Neck ROM: Full    Dental no notable dental hx.    Pulmonary neg pulmonary ROS, neg sleep apnea, neg COPD,    breath sounds clear to auscultation- rhonchi (-) wheezing      Cardiovascular Exercise Tolerance: Good (-) hypertension(-) CAD and (-) Past MI  Rhythm:Regular Rate:Normal - Systolic murmurs and - Diastolic murmurs    Neuro/Psych negative neurological ROS  negative psych ROS   GI/Hepatic negative GI ROS, Neg liver ROS,   Endo/Other  negative endocrine ROSneg diabetes  Renal/GU negative Renal ROS     Musculoskeletal negative musculoskeletal ROS (+)   Abdominal (+) - obese,   Peds  Hematology negative hematology ROS (+)   Anesthesia Other Findings Past Medical History: 12/13/2016: Biliary colic No date: Vasovagal syncope   Reproductive/Obstetrics                             Anesthesia Physical Anesthesia Plan  ASA: I  Anesthesia Plan: General   Post-op Pain Management:    Induction: Intravenous  PONV Risk Score and Plan: 3 and Dexamethasone, Ondansetron and Midazolam  Airway Management Planned: Oral ETT  Additional Equipment:   Intra-op Plan:   Post-operative Plan: Extubation in OR  Informed Consent: I have reviewed the patients History and Physical, chart, labs and discussed the procedure including the risks, benefits and alternatives for the proposed anesthesia with the patient or authorized representative who has indicated his/her understanding and acceptance.   Dental advisory given  Plan Discussed with: CRNA and Anesthesiologist  Anesthesia Plan  Comments:         Anesthesia Quick Evaluation

## 2016-12-20 NOTE — Transfer of Care (Signed)
Immediate Anesthesia Transfer of Care Note  Patient: Courtney ClauseMegan Santos Barnett  Procedure(s) Performed: LAPAROSCOPIC CHOLECYSTECTOMY WITH INTRAOPERATIVE CHOLANGIOGRAM  Patient Location: PACU  Anesthesia Type:General  Level of Consciousness: awake, alert  and oriented  Airway & Oxygen Therapy: Patient Spontanous Breathing  Post-op Assessment: Report given to RN and Post -op Vital signs reviewed and stable  Post vital signs: Reviewed and stable  Last Vitals:  Vitals:   12/20/16 0621 12/20/16 0915  BP: 103/71 107/67  Pulse: 81 (!) 103  Resp: 18 20  Temp: 37 C 37 C  SpO2: 100% 99%    Last Pain:  Vitals:   12/20/16 0915  TempSrc: Temporal         Complications: No apparent anesthesia complications

## 2016-12-21 ENCOUNTER — Encounter: Payer: Self-pay | Admitting: Surgery

## 2016-12-21 LAB — SURGICAL PATHOLOGY

## 2017-01-02 ENCOUNTER — Other Ambulatory Visit: Payer: Self-pay

## 2017-01-04 ENCOUNTER — Encounter: Payer: Self-pay | Admitting: Surgery

## 2017-01-04 ENCOUNTER — Ambulatory Visit (INDEPENDENT_AMBULATORY_CARE_PROVIDER_SITE_OTHER): Payer: BLUE CROSS/BLUE SHIELD | Admitting: Surgery

## 2017-01-04 VITALS — BP 110/74 | HR 89 | Temp 98.4°F | Ht 63.0 in | Wt 109.4 lb

## 2017-01-04 DIAGNOSIS — Z09 Encounter for follow-up examination after completed treatment for conditions other than malignant neoplasm: Secondary | ICD-10-CM

## 2017-01-04 NOTE — Patient Instructions (Signed)
GENERAL POST-OPERATIVE PATIENT INSTRUCTIONS   WOUND CARE INSTRUCTIONS:  Keep a dry clean dressing on the wound if there is drainage. The initial bandage may be removed after 24 hours.  Once the wound has quit draining you may leave it open to air.  If clothing rubs against the wound or causes irritation and the wound is not draining you may cover it with a dry dressing during the daytime.  Try to keep the wound dry and avoid ointments on the wound unless directed to do so.  If the wound becomes bright red and painful or starts to drain infected material that is not clear, please contact your physician immediately.  If the wound is mildly pink and has a thick firm ridge underneath it, this is normal, and is referred to as a healing ridge.  This will resolve over the next 4-6 weeks.  BATHING: You may shower if you have been informed of this by your surgeon. However, Please do not submerge in a tub, hot tub, or pool until incisions are completely sealed or have been told by your surgeon that you may do so.  DIET:  You may eat any foods that you can tolerate.  It is a good idea to eat a high fiber diet and take in plenty of fluids to prevent constipation.  If you do become constipated you may want to take a mild laxative or take ducolax tablets on a daily basis until your bowel habits are regular.  Constipation can be very uncomfortable, along with straining, after recent surgery.  ACTIVITY:  You are encouraged to cough and deep breath or use your incentive spirometer if you were given one, every 15-30 minutes when awake.  This will help prevent respiratory complications and low grade fevers post-operatively if you had a general anesthetic.  You may want to hug a pillow when coughing and sneezing to add additional support to the surgical area, if you had abdominal or chest surgery, which will decrease pain during these times.  You are encouraged to walk and engage in light activity for the next two weeks.  You  should not lift more than 20 pounds, until 01/17/2017 as it could put you at increased risk for complications.  Twenty pounds is roughly equivalent to a plastic bag of groceries. At that time- Listen to your body when lifting, if you have pain when lifting, stop and then try again in a few days. Soreness after doing exercises or activities of daily living is normal as you get back in to your normal routine.  MEDICATIONS:  Try to take narcotic medications and anti-inflammatory medications, such as tylenol, ibuprofen, naprosyn, etc., with food.  This will minimize stomach upset from the medication.  Should you develop nausea and vomiting from the pain medication, or develop a rash, please discontinue the medication and contact your physician.  You should not drive, make important decisions, or operate machinery when taking narcotic pain medication.  SUNBLOCK Use sun block to incision area over the next year if this area will be exposed to sun. This helps decrease scarring and will allow you avoid a permanent darkened area over your incision.  QUESTIONS:  Please feel free to call our office if you have any questions, and we will be glad to assist you. [phone]    

## 2017-01-04 NOTE — Progress Notes (Signed)
01/04/2017  HPI: Patient is s/p laparoscopic cholecystectomy with IOC by Dr. Earlene Plateravis on 11/6.  She presents for follow up.  She's been doing well but reports that she's still having some dizziness when laying flat which is part of her vasovagal syndrome, and also feels fuller or bloated.  No nausea or vomiting, no abdominal pain.  Vital signs: BP 110/74   Pulse 89   Temp 98.4 F (36.9 C) (Oral)   Ht 5\' 3"  (1.6 m)   Wt 49.6 kg (109 lb 6.4 oz)   BMI 19.38 kg/m    Physical Exam: Constitutional: No acute distress Abdomen:  Soft, nondistended, nontender to palpation.  Incisions healing well, clean, dry, intact.  Assessment/Plan: 26 yo female s/p lap cholecystectomy with IOC  --discussed with patient that her bloatedness/fuller symptoms are likely related to her gallbladder surgery but these will dissipate with time.   --recommended that she talk to her PCP regarding her vasovagal symptoms.  Patient reports that she's had the same issue with prior surgeries and they improve on their own. --pathology reviewed with patient. --patient may follow up on an as needed basis.   Howie IllJose Luis Loy Mccartt, MD [facility]

## 2017-06-16 ENCOUNTER — Encounter: Payer: Self-pay | Admitting: Adult Health

## 2017-06-16 ENCOUNTER — Ambulatory Visit (INDEPENDENT_AMBULATORY_CARE_PROVIDER_SITE_OTHER): Payer: BLUE CROSS/BLUE SHIELD | Admitting: Adult Health

## 2017-06-16 VITALS — BP 92/60 | HR 82 | Temp 98.2°F | Ht 63.0 in | Wt 109.9 lb

## 2017-06-16 DIAGNOSIS — J209 Acute bronchitis, unspecified: Secondary | ICD-10-CM | POA: Diagnosis not present

## 2017-06-16 MED ORDER — AZITHROMYCIN 250 MG PO TABS: ORAL_TABLET | ORAL | 0 refills | Status: DC

## 2017-06-16 MED ORDER — HYDROCOD POLST-CPM POLST ER 10-8 MG/5ML PO SUER: 5.0000 mL | Freq: Two times a day (BID) | ORAL | 0 refills | Status: DC | PRN

## 2017-06-16 NOTE — Progress Notes (Signed)
Subjective:    Patient ID: Courtney Barnett, female    DOB: October 15, 1990, 27 y.o.   MRN: [id number]  HPI:  Ms. Courtney Barnett presents with productive cough (thick/yellow), cough worsens at night. Sx's present for >2 weeks and it is steadily worsening. She denies tobacco use She denies hx of asthma or experiencing wheezing She denies fever/night sweats/N/V/D/Cp/palpitations She denies nasal drainage  Patient Care Team    Relationship Specialty Notifications Start End  Julaine Fusi, NP PCP - General Family Medicine  04/07/16     Patient Active Problem List   Diagnosis Date Noted  . Acute bronchitis 06/16/2017  . Calculus of gallbladder without cholecystitis without obstruction   . Biliary colic 12/13/2016  . 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 Healthcare maintenance 04/07/2016     Past Medical History:  Diagnosis Date  . Biliary colic 12/13/2016  . Vasovagal syncope      Past Surgical History:  Procedure Laterality Date  . CHOLECYSTECTOMY  12/20/2016   Procedure: LAPAROSCOPIC CHOLECYSTECTOMY WITH INTRAOPERATIVE CHOLANGIOGRAM;  Surgeon: Ancil Linsey, MD;  Location: [facility] ORS;  Service: General;;  . WISDOM TOOTH EXTRACTION       Family History  Problem Relation Age of Onset  . Healthy Mother   . Healthy Father   . Healthy Brother   . Diabetes Maternal Grandfather   . Healthy Brother   . Healthy Brother      Social History   Substance and Sexual Activity  Drug Use No     Social History   Substance and Sexual Activity  Alcohol Use No     Social History   Tobacco Use  Smoking Status Never Smoker  Smokeless Tobacco Never Used     Outpatient Encounter Medications as of 06/16/2017  Medication Sig Note  . levonorgestrel (MIRENA) 20 MCG/24HR IUD 1 each by Intrauterine route once. 12/16/2016: Implanted 09/2013  . azithromycin (ZITHROMAX) 250 MG tablet 2 tabs day one.  1 tab days two-five.   . chlorpheniramine-HYDROcodone (TUSSIONEX) 10-8 MG/5ML SUER  Take 5 mLs by mouth every 12 (twelve) hours as needed for cough.    No facility-administered encounter medications on file as of 06/16/2017.     Allergies: Patient has no known allergies.  Body mass index is 19.47 kg/m.  Blood pressure 92/60, pulse 82, temperature 98.2 F (36.8 C), temperature source Oral, height  (1.6 m), weight 109 lb 14.4 oz (49.9 kg), SpO2 99 %.     Review of Systems  Constitutional: Positive for fatigue. Negative for activity change, appetite change, chills, diaphoresis, fever and unexpected weight change.  HENT: Positive for voice change. Negative for congestion, ear pain, postnasal drip, rhinorrhea, sore throat and trouble swallowing.   Eyes: Negative for visual disturbance.  Respiratory: Positive for cough and chest tightness. Negative for shortness of breath, wheezing and stridor.   Cardiovascular: Negative for chest pain, palpitations and leg swelling.  Gastrointestinal: Negative for abdominal distention, abdominal pain, blood in stool, constipation, diarrhea, nausea and vomiting.  Neurological: Positive for headaches. Negative for dizziness.  Hematological: Does not bruise/bleed easily.  Psychiatric/Behavioral: Positive for sleep disturbance.       Objective:   Physical Exam  Constitutional: She appears well-developed and well-nourished. No distress.  HENT:  Head: Normocephalic and atraumatic.  Right Ear: External ear normal.  Left Ear: External ear normal.  Eyes: Pupils are equal, round, and reactive to light. Conjunctivae and EOM are normal.  Neck: Normal range of motion. Neck supple.  Cardiovascular: Normal rate, regular  rhythm, normal heart sounds and intact distal pulses.  No murmur heard. Pulmonary/Chest: Effort normal and breath sounds normal. No stridor. No respiratory distress. She has no wheezes. She has no rales. She exhibits no tenderness.  Lymphadenopathy:    She has no cervical adenopathy.  Skin: Skin is warm and dry. Capillary  refill takes less than 2 seconds. No rash noted. She is not diaphoretic. No erythema. No pallor.  Psychiatric: She has a normal mood and affect. Her behavior is normal. Judgment and thought content normal.  Nursing note and vitals reviewed.     Assessment & Plan:   1. Acute bronchitis, unspecified organism     Acute bronchitis Please take Azithromycin as directed. [place] Controlled Substance Database reviewed- no aberrancies noted. Please take Tussionex as directed. Increase fluids/rest/vit c-2,000mg /day. If symptoms persist after Azithromycin completed, then please call clinic    FOLLOW-UP:  Return if symptoms worsen or fail to improve.

## 2017-06-16 NOTE — Assessment & Plan Note (Signed)
Please take Azithromycin as directed. [place] Controlled Substance Database reviewed- no aberrancies noted. Please take Tussionex as directed. Increase fluids/rest/vit c-2,000mg /day. If symptoms persist after Azithromycin completed, then please call clinic

## 2017-06-16 NOTE — Patient Instructions (Addendum)

## 2017-06-23 ENCOUNTER — Telehealth: Payer: Self-pay | Admitting: Adult Health

## 2017-06-23 NOTE — Telephone Encounter (Signed)
Patient was seen a little over a week ago for an acute sick visit and still is not better. She is wondering what to do from here (come ine for a f/u or something else to be prescribed). If it is sending in a new prescription please send to [facility]

## 2017-06-26 ENCOUNTER — Other Ambulatory Visit: Payer: Self-pay | Admitting: Adult Health

## 2017-06-26 ENCOUNTER — Telehealth: Payer: Self-pay | Admitting: Adult Health

## 2017-06-26 MED ORDER — AMOXICILLIN-POT CLAVULANATE 875-125 MG PO TABS: 1.0000 | ORAL_TABLET | Freq: Two times a day (BID) | ORAL | 0 refills | Status: DC

## 2017-06-26 NOTE — Telephone Encounter (Signed)
LVM for pt to call to discuss.  T. Nelson, CMA  

## 2017-06-26 NOTE — Telephone Encounter (Signed)
Patient called states this is 2nd message left for provider -- Advised her that Orpha Bur has been out of the office/ country until Sunday,but would contact her as soon as she has the opportunity  ( we have a very heavy patient load today & Provider would review message,as soon as able & get back to patient.)  ---Forwarding  Follow-up message to medical assistant.  --Fausto Skillern

## 2017-06-26 NOTE — Telephone Encounter (Signed)
LVM informing pt.  T. Louiza Moor, CMA 

## 2017-06-26 NOTE — Telephone Encounter (Signed)
See additional phone note.  T. Cambelle Suchecki, CMA 

## 2017-06-26 NOTE — Telephone Encounter (Signed)
Pt seen 06/16/17 for acute bronchitis and RX'd Zpack.  Pt states that the Zpack resolved her symptoms.  However, 2 days after finishing the abx, cough with sputum production returned (unknown color of sputum).  She is now experiencing mid back pain with radiation to ribs bilaterally when she coughs and shortness of breath only with coughing.  Pt denies fever, nasal drainage, otalgia, or sore throat.  Please advise.  Tiajuana Amass, CMA

## 2017-06-26 NOTE — Telephone Encounter (Signed)
Good Afternoon Tonya, Can you please call Ms. Gwynn Burly and tell her I sent course of Augmentin for her. Please tell her to take as directed and if sx's persist after this ABX then to make OV. Thanks! Orpha Bur

## 2017-12-04 ENCOUNTER — Ambulatory Visit (INDEPENDENT_AMBULATORY_CARE_PROVIDER_SITE_OTHER): Payer: 59 | Admitting: Adult Health

## 2017-12-04 ENCOUNTER — Ambulatory Visit: Payer: 59

## 2017-12-04 ENCOUNTER — Encounter: Payer: Self-pay | Admitting: Adult Health

## 2017-12-04 VITALS — BP 111/77 | HR 85 | Ht 63.0 in | Wt 108.5 lb

## 2017-12-04 DIAGNOSIS — M25542 Pain in joints of left hand: Secondary | ICD-10-CM

## 2017-12-04 DIAGNOSIS — M152 Bouchard's nodes (with arthropathy): Secondary | ICD-10-CM | POA: Insufficient documentation

## 2017-12-04 HISTORY — DX: Bouchard's nodes (with arthropathy): M15.2

## 2017-12-04 MED ORDER — PREDNISONE 20 MG PO TABS: ORAL_TABLET | ORAL | 0 refills | Status: DC

## 2017-12-04 NOTE — Patient Instructions (Addendum)
Likely your swollen finger joint is what is known as Bouchard's Node. Please take Prednisone 20mg  - 1 tablet every 12 hrs for three days, then take once daily for three days. Please take OTC Ibuprofen 600mg  with food every 8 hrs for the 7 days. If finger is still swollen after 1 week, call clinic and we will refer to hand specialist. FEEL BETTER!

## 2017-12-04 NOTE — Progress Notes (Addendum)
Subjective:    Patient ID: Courtney Barnett, female    DOB: [birth year], 27 y.o.   MRN: [id number]  HPI:  Mr. Courtney Barnett presents with pain, redness, swelling, warmth of L 3rd finger that developed >3 months ago. She denies acute trauma/injury prior to onset of sx's. She reports "pressure at rest" and 9/10 "sharp, tingly pain"when touched or with use. She is R hand dominant She has been using ice and OTC NSAIDs with only minimal sx relief She denies recent tick or other insect bite. Last travel outside of [place] was [place] Jan 2019 She denies this ever happening before  Patient Care Team    Relationship Specialty Notifications Start End  Julaine Fusi, NP PCP - General Family Medicine  04/07/16     Patient Active Problem List   Diagnosis Date Noted  . Joint pain in fingers of left hand 12/04/2017  . 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 Acute bronchitis 06/16/2017  . Calculus of gallbladder without cholecystitis without obstruction   . Biliary colic 12/13/2016  . 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 Healthcare maintenance 04/07/2016     Past Medical History:  Diagnosis Date  . Biliary colic 12/13/2016  . Vasovagal syncope      Past Surgical History:  Procedure Laterality Date  . CHOLECYSTECTOMY  12/20/2016   Procedure: LAPAROSCOPIC CHOLECYSTECTOMY WITH INTRAOPERATIVE CHOLANGIOGRAM;  Surgeon: Ancil Linsey, MD;  Location: [facility] ORS;  Service: General;;  . WISDOM TOOTH EXTRACTION       Family History  Problem Relation Age of Onset  . Healthy Mother   . Healthy Father   . Healthy Brother   . Diabetes Maternal Grandfather   . Healthy Brother   . Healthy Brother      Social History   Substance and Sexual Activity  Drug Use No     Social History   Substance and Sexual Activity  Alcohol Use No     Social History   Tobacco Use  Smoking Status Never Smoker  Smokeless Tobacco Never Used     Outpatient Encounter Medications as of 12/04/2017    Medication Sig Note  . levonorgestrel (MIRENA) 20 MCG/24HR IUD 1 each by Intrauterine route once. 12/16/2016: Implanted 09/2013  . predniSONE (DELTASONE) 20 MG tablet 1 tablet by mouth every 12 hrs for 3 days, then 1 tablet daily for three days   . [DISCONTINUED] amoxicillin-clavulanate (AUGMENTIN) 875-125 MG tablet Take 1 tablet by mouth 2 (two) times daily.   . [DISCONTINUED] chlorpheniramine-HYDROcodone (TUSSIONEX) 10-8 MG/5ML SUER Take 5 mLs by mouth every 12 (twelve) hours as needed for cough.    No facility-administered encounter medications on file as of 12/04/2017.     Allergies: Patient has no known allergies.  Body mass index is 19.22 kg/m.  Blood pressure 111/77, pulse 85, height 5\' 3"  (1.6 m), weight 108 lb 8 oz (49.2 kg), SpO2 100 %.     Review of Systems  Constitutional: Positive for fatigue. Negative for activity change, appetite change, chills, diaphoresis, fever and unexpected weight change.  Respiratory: Negative for cough, chest tightness, shortness of breath, wheezing and stridor.   Cardiovascular: Negative for chest pain and leg swelling.  Musculoskeletal: Positive for arthralgias and myalgias.  Skin: Positive for color change and rash. Negative for wound.  Hematological: Does not bruise/bleed easily.       Objective:   Physical Exam  Constitutional: She appears well-developed and well-nourished. No distress.  HENT:  Head: Normocephalic and atraumatic.  Right Ear: External ear normal.  Left Ear: External ear normal.  Nose: Nose normal.  Mouth/Throat: Oropharynx is clear and moist.  Musculoskeletal: She exhibits edema and tenderness.       Left hand: She exhibits decreased range of motion, tenderness, bony tenderness and swelling. She exhibits normal two-point discrimination, normal capillary refill, no deformity and no laceration. Normal sensation noted. Normal strength noted.       Hands: L 3rd finger PIP- Swollen/reddened/warm Limited ROM Brisk cap  refill   Skin: Skin is warm and dry. Capillary refill takes less than 2 seconds. No rash noted. She is not diaphoretic. There is erythema. No pallor.  Psychiatric: She has a normal mood and affect. Her behavior is normal. Judgment and thought content normal.  Nursing note and vitals reviewed.     Assessment & Plan:   1. Joint pain in fingers of left hand   2. Bouchard's node     Bouchard's node Xray-IMPRESSION: Mild left 3rd PIP region soft tissue swelling but no arthropathic or acute osseous changes. Likely your swollen finger joint is what is known as Bouchard's Node. Please take Prednisone 20mg  - 1 tablet every 12 hrs for three days, then take once daily for three days. Please take OTC Ibuprofen 600mg  with food every 8 hrs for the 7 days. If finger is still swollen after 1 week, call clinic and we will refer to hand specialist.  Joint pain in fingers of left hand L hand Xray IMPRESSION: Mild left 3rd PIP region soft tissue swelling but no arthropathic or acute osseous changes.    FOLLOW-UP:  Return if symptoms worsen or fail to improve.

## 2017-12-04 NOTE — Assessment & Plan Note (Addendum)
Xray-IMPRESSION: Mild left 3rd PIP region soft tissue swelling but no arthropathic or acute osseous changes. Likely your swollen finger joint is what is known as Bouchard's Node. Please take Prednisone 20mg  - 1 tablet every 12 hrs for three days, then take once daily for three days. Please take OTC Ibuprofen 600mg  with food every 8 hrs for the 7 days. If finger is still swollen after 1 week, call clinic and we will refer to hand specialist.

## 2017-12-05 NOTE — Assessment & Plan Note (Signed)
L hand Xray IMPRESSION: Mild left 3rd PIP region soft tissue swelling but no arthropathic or acute osseous changes.

## 2018-03-15 NOTE — Progress Notes (Signed)
Subjective:    Patient ID: Courtney Barnett, female    DOB: 1990/06/08, 28 y.o.   MRN: [id number]  HPI:  Courtney Barnett presents sig increase in anxiety and "excessvie worry" that has been steadily increasing since Nov 2019. 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  The construction project is 6 months behind and she has to deal 'with angry customers all day". She denies regular exercise, however reports that her diet is "quite good". She continues to abstain from tobacco/vape/ETOH use She reports being able to fall asleep easily, but will wake early and frequently  She has 27 year old child and plans on trying to try and conceive in a "year or so". She has IUD, however has not had menstrual cycle since Dec 2019 and she reports having monthly cycles even with her Mirena  She also requests referral to local OB/GYN  Patient Care Team    Relationship Specialty Notifications Start End  Julaine Fusianford, Wynn Alldredge D, NP PCP - General Family Medicine  04/07/16     Patient Active Problem List   Diagnosis Date Noted  . GAD (generalized anxiety disorder) 03/19/2018  . Amenorrhea 03/19/2018  . Joint pain in fingers of left hand 12/04/2017  . 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 Acute bronchitis 06/16/2017  . Calculus of gallbladder without cholecystitis without obstruction   . Biliary colic 12/13/2016  . 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 Healthcare maintenance 04/07/2016     Past Medical History:  Diagnosis Date  . Biliary colic 12/13/2016  . Vasovagal syncope      Past Surgical History:  Procedure Laterality Date  . CHOLECYSTECTOMY  12/20/2016   Procedure: LAPAROSCOPIC CHOLECYSTECTOMY WITH INTRAOPERATIVE CHOLANGIOGRAM;  Surgeon: Ancil Linseyavis, Jason Evan, MD;  Location: [facility] ORS;  Service: General;;  . WISDOM TOOTH EXTRACTION       Family  History  Problem Relation Age of Onset  . Healthy Mother   . Healthy Father   . Healthy Brother   . Diabetes Maternal Grandfather   . Healthy Brother   . Healthy Brother      Social History   Substance and Sexual Activity  Drug Use No     Social History   Substance and Sexual Activity  Alcohol Use No     Social History   Tobacco Use  Smoking Status Never Smoker  Smokeless Tobacco Never Used     Outpatient Encounter Medications as of 03/19/2018  Medication Sig Note  . levonorgestrel (MIRENA) 20 MCG/24HR IUD 1 each by Intrauterine route once. 12/16/2016: Implanted 09/2013  . FLUoxetine (PROZAC) 10 MG tablet Take 1 tablet (10 mg total) by mouth daily.   . [DISCONTINUED] predniSONE (DELTASONE) 20 MG tablet 1 tablet by mouth every 12 hrs for 3 days, then 1 tablet daily for three days    No facility-administered encounter medications on file as of 03/19/2018.     Allergies: Patient has no known allergies.  Body mass index is 18.9 kg/m.  Blood pressure 104/72, pulse 90, temperature 98.7 F (37.1 C), temperature source Oral, height 5\' 3"  (1.6 m), weight 106 lb 11.2 oz (48.4 kg), last menstrual period 02/02/2018, SpO2 100 %. Review of Systems  Constitutional: Positive for fatigue. Negative for activity change, appetite change, chills, diaphoresis, fever and unexpected weight change.  HENT: Negative for congestion.   Eyes: Negative for visual disturbance.  Respiratory: Negative  for cough, chest tightness, shortness of breath, wheezing and stridor.   Cardiovascular: Negative for chest pain, palpitations and leg swelling.  Gastrointestinal: Negative for abdominal distention, abdominal pain, blood in stool, constipation, diarrhea, nausea and vomiting.  Genitourinary: Negative for difficulty urinating and flank pain.  Neurological: Negative for dizziness and headaches.  Hematological: Does not bruise/bleed easily.  Psychiatric/Behavioral: Positive for sleep disturbance.  Negative for agitation, behavioral problems, confusion, decreased concentration, dysphoric mood, hallucinations, self-injury and suicidal ideas. The patient is nervous/anxious. The patient is not hyperactive.        Objective:   Physical Exam Vitals signs and nursing note reviewed.  Constitutional:      General: She is not in acute distress.    Appearance: She is not ill-appearing, toxic-appearing or diaphoretic.  HENT:     Head: Normocephalic and atraumatic.     Nose: Nose normal.  Cardiovascular:     Rate and Rhythm: Normal rate.     Pulses: Normal pulses.     Heart sounds: Normal heart sounds. No murmur. No friction rub. No gallop.   Pulmonary:     Effort: Pulmonary effort is normal. No respiratory distress.     Breath sounds: Normal breath sounds. No stridor. No wheezing, rhonchi or rales.  Chest:     Chest wall: No tenderness.  Skin:    Capillary Refill: Capillary refill takes less than 2 seconds.  Neurological:     Mental Status: She is alert.  Psychiatric:        Mood and Affect: Mood normal.        Behavior: Behavior normal.        Thought Content: Thought content normal.        Judgment: Judgment normal.       Assessment & Plan:   1. Amenorrhea   2. GAD (generalized anxiety disorder)   3. Healthcare maintenance     GAD (generalized anxiety disorder) Please start Fluoxetine 10mg  once daily.  Continue to drink plenty of water and follow Mediterranean Diet. Increase regular exercise-  recommend at least 30 minutes daily, 5 days per week of walking, jogging, biking, swimming, YouTube/Pinterest workout videos. Please send MyChart message in 3 weeks and let me know how you are feeling on the new medication.   Amenorrhea Urine Pregnancy Test Referral to local OB/GYN placed Has Mirena IUD  FOLLOW-UP:  Return in about 6 months (around 09/17/2018) for Regular Follow Up.

## 2018-03-17 HISTORY — PX: BREAST SURGERY: SHX581

## 2018-03-19 ENCOUNTER — Encounter: Payer: Self-pay | Admitting: Adult Health

## 2018-03-19 ENCOUNTER — Ambulatory Visit (INDEPENDENT_AMBULATORY_CARE_PROVIDER_SITE_OTHER): Payer: 59 | Admitting: Adult Health

## 2018-03-19 VITALS — BP 104/72 | HR 90 | Temp 98.7°F | Ht 63.0 in | Wt 106.7 lb

## 2018-03-19 DIAGNOSIS — N912 Amenorrhea, unspecified: Secondary | ICD-10-CM | POA: Insufficient documentation

## 2018-03-19 DIAGNOSIS — F411 Generalized anxiety disorder: Secondary | ICD-10-CM | POA: Insufficient documentation

## 2018-03-19 DIAGNOSIS — Z0000 Encounter for general adult medical examination without abnormal findings: Secondary | ICD-10-CM

## 2018-03-19 HISTORY — DX: Generalized anxiety disorder: F41.1

## 2018-03-19 LAB — POCT URINE PREGNANCY: Preg Test, Ur: NEGATIVE

## 2018-03-19 MED ORDER — FLUOXETINE HCL 10 MG PO TABS: 10.0000 mg | ORAL_TABLET | Freq: Every day | ORAL | 0 refills | Status: DC

## 2018-03-19 NOTE — Assessment & Plan Note (Signed)
Please start Fluoxetine 10mg  once daily.  Continue to drink plenty of water and follow Mediterranean Diet. Increase regular exercise-  recommend at least 30 minutes daily, 5 days per week of walking, jogging, biking, swimming, YouTube/Pinterest workout videos. Please send MyChart message in 3 weeks and let me know how you are feeling on the new medication.

## 2018-03-19 NOTE — Patient Instructions (Signed)
Generalized Anxiety Disorder, Adult Generalized anxiety disorder (GAD) is a mental health disorder. People with this condition constantly worry about everyday events. Unlike normal anxiety, worry related to GAD is not triggered by a specific event. These worries also do not fade or get better with time. GAD interferes with life functions, including relationships, work, and school. GAD can vary from mild to severe. People with severe GAD can have intense waves of anxiety with physical symptoms (panic attacks). What are the causes? The exact cause of GAD is not known. What increases the risk? This condition is more likely to develop in:  Women.  People who have a family history of anxiety disorders.  People who are very shy.  People who experience very stressful life events, such as the death of a loved one.  People who have a very stressful family environment. What are the signs or symptoms? People with GAD often worry excessively about many things in their lives, such as their health and family. They may also be overly concerned about:  Doing well at work.  Being on time.  Natural disasters.  Friendships. Physical symptoms of GAD include:  Fatigue.  Muscle tension or having muscle twitches.  Trembling or feeling shaky.  Being easily startled.  Feeling like your heart is pounding or racing.  Feeling out of breath or like you cannot take a deep breath.  Having trouble falling asleep or staying asleep.  Sweating.  Nausea, diarrhea, or irritable bowel syndrome (IBS).  Headaches.  Trouble concentrating or remembering facts.  Restlessness.  Irritability. How is this diagnosed? Your health care provider can diagnose GAD based on your symptoms and medical history. You will also have a physical exam. The health care provider will ask specific questions about your symptoms, including how severe they are, when they started, and if they come and go. Your health care  provider may ask you about your use of alcohol or drugs, including prescription medicines. Your health care provider may refer you to a mental health specialist for further evaluation. Your health care provider will do a thorough examination and may perform additional tests to rule out other possible causes of your symptoms. To be diagnosed with GAD, a person must have anxiety that:  Is out of his or her control.  Affects several different aspects of his or her life, such as work and relationships.  Causes distress that makes him or her unable to take part in normal activities.  Includes at least three physical symptoms of GAD, such as restlessness, fatigue, trouble concentrating, irritability, muscle tension, or sleep problems. Before your health care provider can confirm a diagnosis of GAD, these symptoms must be present more days than they are not, and they must last for six months or longer. How is this treated? The following therapies are usually used to treat GAD:  Medicine. Antidepressant medicine is usually prescribed for long-term daily control. Antianxiety medicines may be added in severe cases, especially when panic attacks occur.  Talk therapy (psychotherapy). Certain types of talk therapy can be helpful in treating GAD by providing support, education, and guidance. Options include: ? Cognitive behavioral therapy (CBT). People learn coping skills and techniques to ease their anxiety. They learn to identify unrealistic or negative thoughts and behaviors and to replace them with positive ones. ? Acceptance and commitment therapy (ACT). This treatment teaches people how to be mindful as a way to cope with unwanted thoughts and feelings. ? Biofeedback. This process trains you to manage your body's response (  physiological response) through breathing techniques and relaxation methods. You will work with a therapist while machines are used to monitor your physical symptoms.  Stress  management techniques. These include yoga, meditation, and exercise. A mental health specialist can help determine which treatment is best for you. Some people see improvement with one type of therapy. However, other people require a combination of therapies. Follow these instructions at home:  Take over-the-counter and prescription medicines only as told by your health care provider.  Try to maintain a normal routine.  Try to anticipate stressful situations and allow extra time to manage them.  Practice any stress management or self-calming techniques as taught by your health care provider.  Do not punish yourself for setbacks or for not making progress.  Try to recognize your accomplishments, even if they are small.  Keep all follow-up visits as told by your health care provider. This is important. Contact a health care provider if:  Your symptoms do not get better.  Your symptoms get worse.  You have signs of depression, such as: ? A persistently sad, cranky, or irritable mood. ? Loss of enjoyment in activities that used to bring you joy. ? Change in weight or eating. ? Changes in sleeping habits. ? Avoiding friends or family members. ? Loss of energy for normal tasks. ? Feelings of guilt or worthlessness. Get help right away if:  You have serious thoughts about hurting yourself or others. If you ever feel like you may hurt yourself or others, or have thoughts about taking your own life, get help right away. You can go to your nearest emergency department or call:  Your local emergency services (911 in the [place]  A suicide crisis helpline, such as the National Suicide Prevention Lifeline at 650-132-1203. This is open 24 hours a day. Summary  Generalized anxiety disorder (GAD) is a mental health disorder that involves worry that is not triggered by a specific event.  People with GAD often worry excessively about many things in their lives, such as their health and  family.  GAD may cause physical symptoms such as restlessness, trouble concentrating, sleep problems, frequent sweating, nausea, diarrhea, headaches, and trembling or muscle twitching.  A mental health specialist can help determine which treatment is best for you. Some people see improvement with one type of therapy. However, other people require a combination of therapies. This information is not intended to replace advice given to you by your health care provider. Make sure you discuss any questions you have with your health care provider. Document Released: 05/28/2012 Document Revised: 12/22/2015 Document Reviewed: 12/22/2015 Elsevier Interactive Patient Education  2019 [employer]vier [employer].  Please start Fluoxetine 10mg  once daily.  Continue to drink plenty of water and follow Mediterranean Diet. Increase regular exercise-  recommend at least 30 minutes daily, 5 days per week of walking, jogging, biking, swimming, YouTube/Pinterest workout videos. Referral to local OB/GYN placed. Please send MyChart message in 3 weeks and let me know how you are feeling on the new medication. Good luck with the job search! Follow-up in 6 months.

## 2018-03-19 NOTE — Assessment & Plan Note (Signed)
Urine Pregnancy Test Referral to local OB/GYN placed Has Mirena IUD

## 2018-03-21 ENCOUNTER — Encounter: Payer: Self-pay | Admitting: Obstetrics and Gynecology

## 2018-03-21 ENCOUNTER — Encounter: Payer: Self-pay | Admitting: Certified Nurse Midwife

## 2018-03-21 ENCOUNTER — Telehealth: Payer: Self-pay | Admitting: Obstetrics and Gynecology

## 2018-03-21 NOTE — Telephone Encounter (Signed)
Called and left a message for patient to call back to schedule a new patient doctor referral appointment with our office to see any provider for an annual well-woman exam.

## 2018-03-30 ENCOUNTER — Other Ambulatory Visit (HOSPITAL_COMMUNITY)
Admission: RE | Admit: 2018-03-30 | Discharge: 2018-03-30 | Disposition: A | Discharge status: Home / Self Care | Payer: 59 | Source: Ambulatory Visit | Attending: Obstetrics and Gynecology | Admitting: Obstetrics and Gynecology

## 2018-03-30 ENCOUNTER — Ambulatory Visit: Payer: 59 | Admitting: Certified Nurse Midwife

## 2018-03-30 ENCOUNTER — Encounter: Payer: Self-pay | Admitting: Certified Nurse Midwife

## 2018-03-30 ENCOUNTER — Ambulatory Visit: Payer: 59 | Admitting: Obstetrics and Gynecology

## 2018-03-30 ENCOUNTER — Encounter

## 2018-03-30 ENCOUNTER — Other Ambulatory Visit: Payer: Self-pay

## 2018-03-30 VITALS — BP 100/70 | HR 68 | Resp 16 | Ht 63.75 in | Wt 106.0 lb

## 2018-03-30 DIAGNOSIS — Z124 Encounter for screening for malignant neoplasm of cervix: Secondary | ICD-10-CM

## 2018-03-30 DIAGNOSIS — N6320 Unspecified lump in the left breast, unspecified quadrant: Secondary | ICD-10-CM

## 2018-03-30 DIAGNOSIS — Z30431 Encounter for routine checking of intrauterine contraceptive device: Secondary | ICD-10-CM

## 2018-03-30 DIAGNOSIS — Z01411 Encounter for gynecological examination (general) (routine) with abnormal findings: Secondary | ICD-10-CM

## 2018-03-30 NOTE — Progress Notes (Signed)
28 y.o. G14P1001 Married  Caucasian Fe here for annual exam. Periods monthly with Mirena IUD which was inserted 09/2013. Past history of menorrhagia/dysmenorrhea prior to IUD insertion. History of anxiety no medication, no issues with now. May consider another pregnancy in the next 2-3 years. Has noted a lump in her left breast recently. Denies trauma or injury. Not premenstrual that she is aware of. No family history of breast cancer. No excessive caffeine use, no tenderness. No other health concerns today.   Patient's last menstrual period was 01/28/2018 (exact date).          Sexually active: Yes.    The current method of family planning is IUD.    Exercising: No.  exercise Smoker:  no  Review of Systems  Constitutional: Negative.   HENT: Negative.   Eyes: Negative.   Respiratory: Negative.   Cardiovascular: Negative.   Gastrointestinal: Negative.   Genitourinary: Negative.   Musculoskeletal: Negative.   Skin:       Left breast lump  Neurological: Negative.   Endo/Heme/Allergies: Negative.   Psychiatric/Behavioral: Negative.     Health Maintenance: Pap:  2015 neg per patient History of Abnormal Pap: no MMG:  none Self Breast exams: no Colonoscopy:  none BMD:   none TDaP:  2015 Shingles: no Pneumonia: no Hep C and HIV: not done Labs: if needed   reports that she has never smoked. She has never used smokeless tobacco. She reports that she does not drink alcohol or use drugs.  Past Medical History:  Diagnosis Date  . Anxiety   . Biliary colic 12/13/2016  . Vasovagal syncope     Past Surgical History:  Procedure Laterality Date  . CHOLECYSTECTOMY  12/20/2016   Procedure: LAPAROSCOPIC CHOLECYSTECTOMY WITH INTRAOPERATIVE CHOLANGIOGRAM;  Surgeon: Ancil Linsey, MD;  Location: [facility] ORS;  Service: General;;  . INTRAUTERINE DEVICE (IUD) INSERTION  09/2013  . WISDOM TOOTH EXTRACTION      Current Outpatient Medications  Medication Sig Dispense Refill  . levonorgestrel  (MIRENA) 20 MCG/24HR IUD 1 each by Intrauterine route once.     No current facility-administered medications for this visit.     Family History  Problem Relation Age of Onset  . Healthy Mother   . Healthy Father   . Healthy Brother   . Diabetes Maternal Grandfather   . Healthy Brother   . Healthy Brother     ROS:  Pertinent items are noted in HPI.  Otherwise, a comprehensive ROS was negative.  Exam:   BP 100/70   Pulse 68   Resp 16   Ht 5' 3.75" (1.619 m)   Wt 106 lb (48.1 kg)   LMP 01/28/2018 (Exact Date)   BMI 18.34 kg/m  Height: 5' 3.75" (161.9 cm) Ht Readings from Last 3 Encounters:  03/30/18 5' 3.75" (1.619 m)  03/19/18 5\' 3"  (1.6 m)  12/04/17 5\' 3"  (1.6 m)    General appearance: alert, cooperative and appears stated age Head: Normocephalic, without obvious abnormality, atraumatic Neck: no adenopathy, supple, symmetrical, trachea midline and thyroid normal to inspection and palpation Lungs: clear to auscultation bilaterally Breasts: normal appearance, no masses or tenderness, No nipple retraction or dimpling, No nipple discharge or bleeding, No axillary or supraclavicular adenopathy, 3 cm mass noted in left breast at 12 o'clock, non tender, mobile Heart: regular rate and rhythm Abdomen: soft, non-tender; no masses,  no organomegaly Extremities: extremities normal, atraumatic, no cyanosis or edema Skin: Skin color, texture, turgor normal. No rashes or lesions Lymph nodes: Cervical, supraclavicular,  and axillary nodes normal. No abnormal inguinal nodes palpated Neurologic: Grossly normal   Pelvic: External genitalia:  no lesions, normal female              Urethra:  normal appearing urethra with no masses, tenderness or lesions              Bartholin's and Skene's: normal                 Vagina: normal appearing vagina with normal color and discharge, no lesions              Cervix: multiparous appearance, no bleeding following Pap, no cervical motion tenderness,  no lesions and IUD string noted in cervix              Pap taken: Yes.   Bimanual Exam:  Uterus:  normal size, contour, position, consistency, mobility, non-tender and anteverted              Adnexa: normal adnexa and no mass, fullness, tenderness               Rectovaginal: Confirms               Anus:  normal appearance, no lesions  Chaperone present: yes  A:  Well Woman with normal exam  Contraception Mirena IUD due for replacement 09/2018. Desires exchange.   Left breast mass ?cyst  History Vasovagal syncope  P:   Reviewed health and wellness pertinent to exam  Warning signs with IUD given and need to call in 08/2018 to schedule exchange before expires. Patient voiced understanding.  Discussed left breast mass finding with possible cyst. Feel should be evaluated with [place]. Patient agreeable. Patient will be scheduled for [place] prior to leaving today. Questions addressed.  Patient aware of symptoms and precautions to avoid.  Pap smear: yes  counseled on breast self exam, feminine hygiene, adequate intake of calcium and vitamin D, diet and exercise  return annually or prn  An After Visit Summary was printed and given to the patient.

## 2018-03-30 NOTE — Progress Notes (Signed)
Patient scheduled while in office for left breast US at [facility] on 2/21 at 8:40am, arriving at 8:20am. Patient verbalizes understanding and is agreeable.

## 2018-04-02 ENCOUNTER — Other Ambulatory Visit: Payer: Self-pay | Admitting: Certified Nurse Midwife

## 2018-04-02 ENCOUNTER — Ambulatory Visit
Admission: RE | Admit: 2018-04-02 | Discharge: 2018-04-02 | Disposition: A | Discharge status: Home / Self Care | Payer: 59 | Source: Ambulatory Visit | Attending: Certified Nurse Midwife | Admitting: Certified Nurse Midwife

## 2018-04-02 DIAGNOSIS — N6320 Unspecified lump in the left breast, unspecified quadrant: Secondary | ICD-10-CM

## 2018-04-02 DIAGNOSIS — N6489 Other specified disorders of breast: Secondary | ICD-10-CM | POA: Diagnosis not present

## 2018-04-03 LAB — CYTOLOGY - PAP: Diagnosis: NEGATIVE

## 2018-04-04 ENCOUNTER — Telehealth: Payer: Self-pay | Admitting: Certified Nurse Midwife

## 2018-04-04 NOTE — Telephone Encounter (Signed)
Patient sent the following correspondence through MyChart. Routing to ROI to assist patient with request.  I got left breast imaging done on February 14th at the [facility] [place]. I wondered if I would be able to get a copy of the ultrasound pictures. Thank you   Cc: Greta

## 2018-04-04 NOTE — Telephone Encounter (Signed)
Call to patient.  She is given results from Leota Sauers CNM.  Discussed results and recommendations.  Patient thinks she will continue with 6 month follow up but thinks she may want to have breast biopsy. She is thinking about her options.  She will continue self breast exams and will call back with any concerns.

## 2018-04-04 NOTE — Telephone Encounter (Signed)
Message   Good Afternoon    I wondered if I could receive some clarification on my left breast diagnosis from Feb 14th. I understand that they said it is most likely benign, however I just wanted some clarification on what exactly was found (ex. If it was a mass or something else). Thank you!

## 2018-04-04 NOTE — Telephone Encounter (Signed)
-----   Message from Courtney Barnett, CNM sent at 04/02/2018  2:02 PM EST ----- [place] report of mass favors benign hamartoma, versus focal island of glandular tissue. No suspicious mass noted. Patient opted for follow up 6 months with mammogram instead of biopsy. Patient needs to notify if change in size or other changes in breast. MM recall 6 months [place]

## 2018-04-16 ENCOUNTER — Telehealth: Payer: Self-pay | Admitting: Certified Nurse Midwife

## 2018-04-16 NOTE — Telephone Encounter (Signed)
Call to patient.  Advised needs breast check. Cannot order breast ultrasound without breast check.  Pt agreeable.  Discussed multiple days and times.  Office visit 04/18/2018. Declines appointment for tomorrow.  Routing to [employer] CNM.

## 2018-04-16 NOTE — Telephone Encounter (Signed)
Message   Good Morning    I was told to contact the office if I noticed any changes in my left breast. The area is feeling more stiff to me. The imaging center said to come back for an ultrasound if I notice any changes.

## 2018-04-17 ENCOUNTER — Other Ambulatory Visit: Payer: Self-pay

## 2018-04-17 ENCOUNTER — Ambulatory Visit: Payer: 59 | Admitting: Certified Nurse Midwife

## 2018-04-17 ENCOUNTER — Encounter: Payer: Self-pay | Admitting: Certified Nurse Midwife

## 2018-04-17 VITALS — BP 110/70 | HR 68 | Resp 16 | Wt 110.0 lb

## 2018-04-17 DIAGNOSIS — N6320 Unspecified lump in the left breast, unspecified quadrant: Secondary | ICD-10-CM | POA: Diagnosis not present

## 2018-04-17 NOTE — Progress Notes (Signed)
   Subjective:   28 y.o. MarriedCaucasian female presents for evaluation of left breast mass that was noted on last aex. She feels it has become firmer. No tenderness, nipple discharge or redness or other mass noted. Patient was seen for [place] 04/02/2018 and Favored to be hamartoma versus glandular tissue noted. Patient was offered biopsy and declined to six month follow up. No family history of breast cancer.  Review of Systems Review of Systems  Constitutional: Negative.   HENT: Negative.   Eyes: Negative.   Respiratory: Negative.   Cardiovascular: Negative.   Gastrointestinal: Negative.   Genitourinary: Negative.   Musculoskeletal: Negative.   Skin: Negative.   Neurological: Negative.   Endo/Heme/Allergies: Negative.   Psychiatric/Behavioral: The patient is nervous/anxious.        About breast mass she had [place] on       Objective:   General appearance: alert, cooperative, appears stated age and no distress Breasts: normal appearance, no masses or tenderness, No nipple retraction or dimpling, No nipple discharge or bleeding, No axillary or supraclavicular adenopathy, left breast mass noted at 12 o'clock 3-4 cm. ? 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   Plan:   PLAN:  Discussed finding consistent with imaging, with possible increase in firmness. Discussed biopsy vs repeat US in 6 months. Patient not sure at this point. Will discuss with spouse and advise.The patient is considering biopsy. Questions addressed. Rv prn as above

## 2018-04-18 ENCOUNTER — Other Ambulatory Visit: Payer: Self-pay | Admitting: Certified Nurse Midwife

## 2018-04-18 ENCOUNTER — Telehealth: Payer: Self-pay | Admitting: Certified Nurse Midwife

## 2018-04-18 ENCOUNTER — Ambulatory Visit: Payer: Self-pay | Admitting: Certified Nurse Midwife

## 2018-04-18 DIAGNOSIS — N6320 Unspecified lump in the left breast, unspecified quadrant: Secondary | ICD-10-CM

## 2018-04-18 NOTE — Telephone Encounter (Signed)
Call to patient, request to proceed with US guided left breast biopsy at [facility]. Will be out of town 3/6 -3/9, request first available appt. Advised I will call to schedule and return call, patient agreeable.

## 2018-04-18 NOTE — Telephone Encounter (Signed)
Order placed for US guided left breast biopsy. Call placed to [facility], spoke with Tonya. Breast US cancelled for 09/17/2018. US guided breast biopsy scheduled for 04/19/18 arriving at 12:30pm, appt at 12:45pm. Order needs to be signed off today.

## 2018-04-18 NOTE — Telephone Encounter (Signed)
Patient has decided that she would like to proceed with scheduling her biopsy.

## 2018-04-18 NOTE — Telephone Encounter (Signed)
Left detailed message, ok per dpr. Advised of US guided breast biopsy appt scheduled for 04/19/18 arriving at [facility] at 12:30pm, appointment at 12:45pm. Return call to office if any additional questions. If any changes need to be made to appt, contact [facility] directly at [phone].   Routing to [employer], CNM to sign off on imaging orders.

## 2018-04-19 ENCOUNTER — Ambulatory Visit
Admission: RE | Admit: 2018-04-19 | Discharge: 2018-04-19 | Disposition: A | Discharge status: Home / Self Care | Payer: 59 | Source: Ambulatory Visit | Attending: Certified Nurse Midwife | Admitting: Certified Nurse Midwife

## 2018-04-19 DIAGNOSIS — N6012 Diffuse cystic mastopathy of left breast: Secondary | ICD-10-CM | POA: Diagnosis not present

## 2018-04-19 DIAGNOSIS — N6320 Unspecified lump in the left breast, unspecified quadrant: Secondary | ICD-10-CM

## 2018-04-19 DIAGNOSIS — N6321 Unspecified lump in the left breast, upper outer quadrant: Secondary | ICD-10-CM | POA: Diagnosis not present

## 2018-05-18 DIAGNOSIS — H1045 Other chronic allergic conjunctivitis: Secondary | ICD-10-CM | POA: Diagnosis not present

## 2018-06-10 ENCOUNTER — Encounter: Payer: Self-pay | Admitting: Adult Health

## 2018-06-12 ENCOUNTER — Other Ambulatory Visit: Payer: Self-pay

## 2018-06-12 ENCOUNTER — Encounter: Payer: Self-pay | Admitting: Adult Health

## 2018-06-12 ENCOUNTER — Ambulatory Visit (INDEPENDENT_AMBULATORY_CARE_PROVIDER_SITE_OTHER): Payer: 59 | Admitting: Adult Health

## 2018-06-12 VITALS — BP 107/78 | HR 94 | Temp 99.0°F | Ht 63.75 in | Wt 112.9 lb

## 2018-06-12 DIAGNOSIS — Z0000 Encounter for general adult medical examination without abnormal findings: Secondary | ICD-10-CM | POA: Diagnosis not present

## 2018-06-12 DIAGNOSIS — R1013 Epigastric pain: Secondary | ICD-10-CM | POA: Diagnosis not present

## 2018-06-12 NOTE — Patient Instructions (Signed)
Helicobacter Pylori Infection Helicobacter pylori infection is a bacterial infection in the stomach. Long-term (chronic) infection can cause stomach irritation (gastritis), ulcers in the stomach (gastric ulcers), and ulcers in the upper part of the intestine (duodenal ulcers). Having this infection may also increase your risk of stomach cancer and a type of white blood cell cancer (lymphoma) that affects the stomach. What are the causes? This infection is caused by the Helicobacter pylori (H. pylori) bacteria. Many healthy people have this bacteria in their stomach lining. The bacteria may also spread from person to person through contact with stool (feces) or saliva. It is not known why some people develop ulcers, gastritis, or cancer from the bacteria. What increases the risk? You are more likely to develop this condition if you:  Have family members with the infection.  Live with many other people, such as in a dormitory.  Are of African, Hispanic, or Asian descent. What are the signs or symptoms? Most people with this infection do not have any symptoms. If you do have symptoms, they may include:  Heartburn.  Stomach pain.  Nausea.  Vomiting. The vomit may be bloody because of ulcers.  Loss of appetite.  Bad breath. How is this diagnosed? This condition may be diagnosed based on:  Your symptoms and medical history.  A physical exam.  Blood tests.  Stool tests.  A breath test.  A procedure that involves placing a tube with a camera on the end of it down your throat to examine your stomach and upper intestine (upper endoscopy).  Removing and testing a tissue sample from the stomach lining (biopsy). A biopsy may be taken during an upper endoscopy. How is this treated?  This condition is treated by taking a combination of medicines (triple therapy) for several weeks. Triple therapy includes one medicine to reduce the amount of acid in your stomach and two types of  antibiotic medicines. This treatment may reduce your risk of cancer. You may need to be tested for H. pylori again after treatment. In some cases, the treatment may need to be repeated if your treatment did not get rid of all the bacteria. Follow these instructions at home:   Take over-the-counter and prescription medicines only as told by your health care provider.  Take your antibiotics as told by your health care provider. Do not stop taking the antibiotics even if you start to feel better.  Return to your normal activities as told by your health care provider. Ask your health care provider what activities are safe for you.  Take steps to prevent future infections: ? Wash your hands often with soap and water. If soap and water are not available, use hand sanitizer. ? Do not eat food or drink water that may have had contact with stool or saliva.  Keep all follow-up visits as told by your health care provider. This is important. You may need tests to make sure your treatment worked. Contact a health care provider if your symptoms:  Do not get better with treatment.  Return after treatment. Summary  Helicobacter pylori infection is a stomach infection caused by the Helicobacter pylori (H. pylori) bacteria.  This infection can cause stomach irritation (gastritis), ulcers in the stomach (gastric ulcers), and ulcers in the upper part of the intestine (duodenal ulcers).  This condition is treated by taking a combination of medicines (triple therapy) for several weeks.  Take your antibiotics as told by your health care provider. Do not stop taking the antibiotics even if  you start to feel better. This information is not intended to replace advice given to you by your health care provider. Make sure you discuss any questions you have with your health care provider. Document Released: 05/25/2015 Document Revised: 01/24/2017 Document Reviewed: 01/24/2017 Elsevier Interactive Patient Education   2019 [employer].  Please proceed to [employer]. We will call you with results ASAP. If results are negative, will proceed with abdominal Ultrasound. Remain well hydrated, avoid spicy/acidic food. FEEL BETTER!

## 2018-06-12 NOTE — Assessment & Plan Note (Signed)
COVID-19 Education: Signs and symptoms of COVID-19 infection were discussed with pt and how to seek care for testing.  The importance of following the Stay at Home order, and when out- Social Distancing and wearing a facial mask were discussed today. 

## 2018-06-12 NOTE — Assessment & Plan Note (Addendum)
For sx control- Omeprazole 20mg  BID for 3 days, then down to QD Please proceed to [facility] for H Pylori Breath Test. We will call you with results ASAP, if + will treat with triple therapy. If results are negative, will proceed with abdominal Ultrasound. Remain well hydrated, avoid spicy/acidic food.

## 2018-06-12 NOTE — Progress Notes (Addendum)
Subjective:    Patient ID: Courtney Barnett, female    DOB: 08/03/1990, 28 y.o.   MRN: [id number]  HPI:  Courtney Barnett presents with epigastric pain that has been gradually worsening for the last 5 days. Pain is rated 8/10 that is constant that will "occasionally spike" to 10/10, pain described as "gnawing". Pain is worse on empty stomach Pain is lessened with eating, no specific food helps more than others She denies N/V/D/constipation She reports normal BM- daily, well formed stool She denies hematochezia, hematuria She denies fever/night sweats She reports reduced appetite She denies early satiety  She denies known exposure to COVID-19 She reports travelling to [place] to visit family in early March She underwent Cholecystectomy Nov 2018 She denies previous dx of H Pylori Infection  Patient Care Team    Relationship Specialty Notifications Start End  Arlin Savona, Jinny Blossom, NP PCP - General Family Medicine  04/07/16     Patient Active Problem List   Diagnosis Date Noted  . Epigastric pain 06/12/2018  . GAD (generalized anxiety disorder) 03/19/2018  . Amenorrhea 03/19/2018  . Joint pain in fingers of left hand 12/04/2017  . 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 Acute bronchitis 06/16/2017  . Calculus of gallbladder without cholecystitis without obstruction   . Biliary colic 12/13/2016  . 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 Healthcare maintenance 04/07/2016     Past Medical History:  Diagnosis Date  . Anxiety   . Biliary colic 12/13/2016  . Vasovagal syncope      Past Surgical History:  Procedure Laterality Date  . BREAST SURGERY Left    biopsy  . CHOLECYSTECTOMY  12/20/2016   Procedure: LAPAROSCOPIC CHOLECYSTECTOMY WITH INTRAOPERATIVE CHOLANGIOGRAM;  Surgeon: Ancil Linsey, MD;  Location: [facility] ORS;  Service: General;;  . INTRAUTERINE DEVICE (IUD) INSERTION  09/2013  . WISDOM TOOTH EXTRACTION       Family History  Problem Relation Age of Onset  . Diabetes Maternal  Grandfather      Social History   Substance and Sexual Activity  Drug Use No     Social History   Substance and Sexual Activity  Alcohol Use No     Social History   Tobacco Use  Smoking Status Never Smoker  Smokeless Tobacco Never Used     Outpatient Encounter Medications as of 06/12/2018  Medication Sig Note  . levonorgestrel (MIRENA) 20 MCG/24HR IUD 1 each by Intrauterine route once. 12/16/2016: Implanted 09/2013  . omeprazole (PRILOSEC) 20 MG capsule Take 1 capsule (20 mg total) by mouth 2 (two) times daily before a meal.    No facility-administered encounter medications on file as of 06/12/2018.     Allergies: Patient has no known allergies.  Body mass index is 19.53 kg/m.  Blood pressure 107/78, pulse 94, temperature 99 F (37.2 C), temperature source Oral, height 5' 3.75" (1.619 m), weight 112 lb 14.4 oz (51.2 kg), last menstrual period 05/04/2018, SpO2 100 %.  Review of Systems  Constitutional: Positive for appetite change and fatigue. Negative for activity change, chills, diaphoresis, fever and unexpected weight change.  HENT: Negative for congestion.   Eyes: Negative for visual disturbance.  Respiratory: Negative for cough, chest tightness, shortness of breath, wheezing and stridor.   Cardiovascular: Negative for chest pain, palpitations and leg swelling.  Gastrointestinal: Positive for abdominal pain. Negative for abdominal distention, anal bleeding, blood in stool, constipation, diarrhea, nausea and vomiting.  Endocrine: Negative for cold intolerance, heat intolerance, polydipsia, polyphagia and polyuria.  Genitourinary: Negative for difficulty urinating and  flank pain.  Skin: Negative for color change, pallor, rash and wound.  Neurological: Negative for dizziness and headaches.  Hematological: Does not bruise/bleed easily.       Objective:   Physical Exam Constitutional:      General: She is not in acute distress.    Appearance: She is not  ill-appearing, toxic-appearing or diaphoretic.  HENT:     Head: Normocephalic and atraumatic.  Cardiovascular:     Rate and Rhythm: Normal rate.     Heart sounds: Normal heart sounds. No murmur. No friction rub. No gallop.   Pulmonary:     Effort: Pulmonary effort is normal. No respiratory distress.     Breath sounds: Normal breath sounds. No stridor. No wheezing, rhonchi or rales.  Chest:     Chest wall: No tenderness.  Abdominal:     General: Abdomen is flat. Bowel sounds are normal. There is no distension or abdominal bruit.     Tenderness: There is abdominal tenderness in the right upper quadrant, epigastric area and periumbilical area. There is no right CVA tenderness, left CVA tenderness, guarding or rebound. Negative signs include Murphy's sign, Rovsing's sign, McBurney's sign, psoas sign and obturator sign.     Hernia: No hernia is present.     Comments: Pt jumped from exam table to floor, no pain reported in RLQ  Skin:    General: Skin is warm and dry.     Capillary Refill: Capillary refill takes less than 2 seconds.  Neurological:     Mental Status: She is alert and oriented to person, place, and time.  Psychiatric:        Mood and Affect: Mood normal.        Behavior: Behavior normal.       Assessment & Plan:   1. Epigastric pain   2. Healthcare maintenance     Epigastric pain For sx control- Omeprazole 20mg  BID for 3 days, then down to QD [facility] H [facility]yl[facility]ath Test. We will call you with results ASAP, if + will treat with triple therapy. If results are negative, will proceed with abdominal Ultrasound. Remain well hydrated, avoid spicy/acidic food.  Healthcare maintenance COVID-19 Education: Signs and symptoms of COVID-19 infection were discussed with pt and how to seek care for testing.  The importance of following the Stay at Home order, and when out- Social Distancing and wearing a facial mask were discussed  today.      FOLLOW-UP:  Return if symptoms worsen or fail to improve.

## 2018-06-13 MED ORDER — OMEPRAZOLE 20 MG PO CPDR: 20.0000 mg | DELAYED_RELEASE_CAPSULE | Freq: Two times a day (BID) | ORAL | 1 refills | Status: DC

## 2018-06-13 NOTE — Addendum Note (Signed)
Addended by: William Hamburger D on: 06/13/2018 04:44 PM   Modules accepted: Orders

## 2018-06-13 NOTE — Addendum Note (Signed)
Addended by: William Hamburger D on: 06/13/2018 04:59 PM   Modules accepted: Orders

## 2018-06-14 ENCOUNTER — Other Ambulatory Visit: Payer: Self-pay

## 2018-06-14 ENCOUNTER — Other Ambulatory Visit (INDEPENDENT_AMBULATORY_CARE_PROVIDER_SITE_OTHER): Payer: 59

## 2018-06-14 DIAGNOSIS — R1013 Epigastric pain: Secondary | ICD-10-CM

## 2018-06-14 LAB — H. PYLORI BREATH TEST

## 2018-06-14 NOTE — Progress Notes (Signed)
Glad PPI is helping Please call Agata and request that she come into clinic for labs and stool test, will forgo [facility] Thanks! Orpha Bur

## 2018-06-14 NOTE — Progress Notes (Signed)
Pt's h pylori breath test was collected incorrectly yesterday as the patient was not fasting for 1 hour prior to testing.  Spoke with Angelica Chessman at [facility] who states that the sample will need to be recollected.  Advised pt that she will need to pick up new order to take to [facility].  Anthony Sar that pt should NOT be charged for initial test.  Angelica Chessman states that she will cancel the order for the initial test so that pt is not charged twice.  New order requisition placed at front desk.  Pt states that the omeprazole "took the edge off of the pain and she was able to sleep a little better last night".  However, pt states that she continues to have significant pain.  Tiajuana Amass, CMA  Addendum:  Per William Hamburger, NP we will not repeat h. pylori breath test.  Instead, will collected specimens for cmp, ggt, indirect bili, amylase, lipase, CBC and stool for h. pylori.  Tiajuana Amass, CMA

## 2018-06-14 NOTE — Addendum Note (Signed)
Addended by: William Hamburger D on: 06/14/2018 11:52 AM   Modules accepted: Orders

## 2018-06-15 LAB — COMPREHENSIVE METABOLIC PANEL
ALT: 24 IU/L (ref 0–32)
AST: 22 IU/L (ref 0–40)
Albumin/Globulin Ratio: 1.8 (ref 1.2–2.2)
Albumin: 4.7 g/dL (ref 3.9–5.0)
Alkaline Phosphatase: 56 IU/L (ref 39–117)
BUN/Creatinine Ratio: 15 (ref 9–23)
BUN: 9 mg/dL (ref 6–20)
Bilirubin Total: 1.9 mg/dL — ABNORMAL HIGH (ref 0.0–1.2)
CO2: 23 mmol/L (ref 20–29)
Calcium: 10.1 mg/dL (ref 8.7–10.2)
Chloride: 103 mmol/L (ref 96–106)
Creatinine, Ser: 0.62 mg/dL (ref 0.57–1.00)
GFR calc Af Amer: 144 mL/min/{1.73_m2} (ref >59)
GFR calc non Af Amer: 125 mL/min/{1.73_m2} (ref >59)
Globulin, Total: 2.6 g/dL (ref 1.5–4.5)
Glucose: 93 mg/dL (ref 65–99)
Potassium: 4.3 mmol/L (ref 3.5–5.2)
Sodium: 143 mmol/L (ref 134–144)
Total Protein: 7.3 g/dL (ref 6.0–8.5)

## 2018-06-15 LAB — GAMMA GT: GGT: 11 IU/L (ref 0–60)

## 2018-06-15 LAB — CBC WITH DIFFERENTIAL/PLATELET
Basophils Absolute: 0.1 10*3/uL (ref 0.0–0.2)
Basos: 1 %
EOS (ABSOLUTE): 0.1 10*3/uL (ref 0.0–0.4)
Eos: 1 %
Hematocrit: 38.4 % (ref 34.0–46.6)
Hemoglobin: 13.1 g/dL (ref 11.1–15.9)
Immature Grans (Abs): 0 10*3/uL (ref 0.0–0.1)
Immature Granulocytes: 0 %
Lymphocytes Absolute: 1.8 10*3/uL (ref 0.7–3.1)
Lymphs: 27 %
MCH: 29.2 pg (ref 26.6–33.0)
MCHC: 34.1 g/dL (ref 31.5–35.7)
MCV: 86 fL (ref 79–97)
Monocytes Absolute: 0.4 10*3/uL (ref 0.1–0.9)
Monocytes: 6 %
Neutrophils Absolute: 4.1 10*3/uL (ref 1.4–7.0)
Neutrophils: 65 %
Platelets: 285 10*3/uL (ref 150–450)
RBC: 4.49 x10E6/uL (ref 3.77–5.28)
RDW: 11.8 % (ref 11.7–15.4)
WBC: 6.4 10*3/uL (ref 3.4–10.8)

## 2018-06-15 LAB — LIPASE: Lipase: 27 U/L (ref 14–72)

## 2018-06-15 LAB — AMYLASE: Amylase: 55 U/L (ref 31–110)

## 2018-06-18 ENCOUNTER — Other Ambulatory Visit: Payer: 59

## 2018-06-18 ENCOUNTER — Other Ambulatory Visit: Payer: Self-pay

## 2018-06-18 DIAGNOSIS — R1013 Epigastric pain: Secondary | ICD-10-CM

## 2018-06-19 LAB — H. PYLORI ANTIGEN, STOOL

## 2018-06-19 LAB — SPECIMEN STATUS REPORT

## 2018-06-21 ENCOUNTER — Other Ambulatory Visit: Payer: 59

## 2018-06-21 ENCOUNTER — Other Ambulatory Visit: Payer: Self-pay

## 2018-06-21 DIAGNOSIS — R1013 Epigastric pain: Secondary | ICD-10-CM | POA: Diagnosis not present

## 2018-06-22 LAB — H. PYLORI ANTIGEN, STOOL: H pylori Ag, Stl: NEGATIVE

## 2018-06-23 ENCOUNTER — Encounter: Payer: Self-pay | Admitting: Adult Health

## 2018-06-25 ENCOUNTER — Telehealth: Payer: Self-pay | Admitting: Adult Health

## 2018-06-25 ENCOUNTER — Other Ambulatory Visit: Payer: Self-pay | Admitting: Adult Health

## 2018-06-25 ENCOUNTER — Other Ambulatory Visit: Payer: Self-pay

## 2018-06-25 DIAGNOSIS — R1013 Epigastric pain: Secondary | ICD-10-CM

## 2018-06-25 MED ORDER — FAMOTIDINE 20 MG PO TABS: 20.0000 mg | ORAL_TABLET | Freq: Every day | ORAL | 3 refills | Status: DC

## 2018-06-25 NOTE — Telephone Encounter (Signed)
Patient called to says does NOT have a Comptroller & her were researching her medical history and she thought she had been to one but was mistaken.  --FYI to [place].  --glh

## 2018-06-25 NOTE — Progress Notes (Signed)
img 

## 2018-06-25 NOTE — Telephone Encounter (Signed)
Referral to GI placed.  Pt informed and advised to call back and speak with Joselyn Glassman if she has not heard anything about an appointment by then.  Pt expressed understanding and is agreeable.  Tiajuana Amass, CMA

## 2018-06-27 ENCOUNTER — Telehealth: Payer: Self-pay

## 2018-06-27 ENCOUNTER — Ambulatory Visit (INDEPENDENT_AMBULATORY_CARE_PROVIDER_SITE_OTHER): Payer: 59 | Admitting: Gastroenterology

## 2018-06-27 ENCOUNTER — Encounter: Payer: Self-pay | Admitting: Gastroenterology

## 2018-06-27 ENCOUNTER — Other Ambulatory Visit: Payer: Self-pay

## 2018-06-27 DIAGNOSIS — R1013 Epigastric pain: Secondary | ICD-10-CM

## 2018-06-27 NOTE — Telephone Encounter (Signed)
She had telemedicine with GI and they advised to remain on PPI and not start H2 (Pepcid) Do not need to find a replacement Thanks! Orpha Bur

## 2018-06-27 NOTE — Telephone Encounter (Signed)
Received fax from pharmacy stating that famotidine is not available.  They are requesting an alternate medication.  Tiajuana Amass, CMA

## 2018-06-27 NOTE — Telephone Encounter (Signed)
Stephanie at [facility] informed that no alternative therapy is needed.  Tiajuana Amass, CMA

## 2018-06-27 NOTE — Progress Notes (Signed)
This patient contacted our office requesting a physician telemedicine consultation regarding clinical questions and/or test results.  If new patient, they were referred by William Hamburger, NP  Participants on the conference : myself and patient   The patient consented to this consultation and was aware that a charge will be placed through their insurance.  I was in my office and the patient was at home   Encounter time:  Total time 30 minutes, with 20 minutes spent with patient on Doximity   _____________________________________________________________________________________________              [facility] Consult Note:  History: Courtney Barnett 06/27/2018  Referring physician: Julaine Fusi, NP  Reason for consult/chief complaint: No chief complaint on file.   Subjective  HPI:  This is a very pleasant 28 year old woman referred by primary care for recent onset burning epigastric pain.  It began about 3 weeks ago, it is a centralized nonradiating burning epigastric discomfort that is at a low level much of the time, worse on an empty stomach and relieved somewhat with food.  She denies nausea, vomiting, early satiety, weight loss recent change in bowel habits.  Since her cholecystectomy in 2018, she has tended toward constipation and will occasionally have blood on the paper if she has a firm stool with straining.  She has had testing as recorded below, only took the omeprazole for 2 days and stopped because it did not seem to help.  She had not yet picked up the prescription for Pepcid, and I recommended she not do that at this point.  She was also scheduled for an abdominal ultrasound but has not yet scheduled it.   ROS:  Review of Systems  Constitutional: Negative for appetite change and unexpected weight change.  HENT: Negative for mouth sores and voice change.   Eyes: Negative for pain and redness.  Respiratory: Negative for cough and shortness of  breath.   Cardiovascular: Negative for chest pain and palpitations.  Genitourinary: Negative for dysuria and hematuria.  Musculoskeletal: Negative for arthralgias and myalgias.  Skin: Negative for pallor and rash.  Neurological: Negative for weakness and headaches.  Hematological: Negative for adenopathy.     Past Medical History: Past Medical History:  Diagnosis Date  . Anxiety   . Biliary colic 12/13/2016  . Vasovagal syncope      Past Surgical History: Past Surgical History:  Procedure Laterality Date  . BREAST SURGERY Left 03/2018   biopsy  . CHOLECYSTECTOMY  12/20/2016   Procedure: LAPAROSCOPIC CHOLECYSTECTOMY WITH INTRAOPERATIVE CHOLANGIOGRAM;  Surgeon: Ancil Linsey, MD;  Location: [facility] ORS;  Service: General;;  . INTRAUTERINE DEVICE (IUD) INSERTION  09/2013  . WISDOM TOOTH EXTRACTION       Family History: Family History  Problem Relation Age of Onset  . Diabetes Maternal Grandfather   . Colon cancer Neg Hx   . Stomach cancer Neg Hx   . Pancreatic cancer Neg Hx     Social History: Social History   Socioeconomic History  . Marital status: Married    Spouse name: Not on file  . Number of children: Not on file  . Years of education: Not on file  . Highest education level: Not on file  Occupational History  . Not on file  Social Needs  . Financial resource strain: Not on file  . Food insecurity:    Worry: Not on file    Inability: Not on file  . Transportation needs:    Medical: Not on  file    Non-medical: Not on file  Tobacco Use  . Smoking status: Never Smoker  . Smokeless tobacco: Never Used  Substance and Sexual Activity  . Alcohol use: No  . Drug use: No  . Sexual activity: Yes    Partners: Male    Birth control/protection: I.U.D.  Lifestyle  . Physical activity:    Days per week: Not on file    Minutes per session: Not on file  . Stress: Not on file  Relationships  . Social connections:    Talks on phone: Not on file    Gets  together: Not on file    Attends religious service: Not on file    Active member of club or organization: Not on file    Attends meetings of clubs or organizations: Not on file    Relationship status: Not on file  Other Topics Concern  . Not on file  Social History Narrative  . Not on file    Allergies: No Known Allergies  Outpatient Meds: Current Outpatient Medications  Medication Sig Dispense Refill  . famotidine (PEPCID) 20 MG tablet Take 1 tablet (20 mg total) by mouth daily. 30 tablet 3  . levonorgestrel (MIRENA) 20 MCG/24HR IUD 1 each by Intrauterine route once.    Marland Kitchen omeprazole (PRILOSEC) 20 MG capsule Take 1 capsule (20 mg total) by mouth 2 (two) times daily before a meal. 60 capsule 1   No current facility-administered medications for this visit.       ___________________________________________________________________ Objective   Exam:   She is well-appearing, pleasant and conversational  Remainder of exam not performed-virtual visit  Labs:  CBC Latest Ref Rng & Units 06/14/2018 12/10/2016 04/12/2016  WBC 3.4 - 10.8 x10E3/uL 6.4 8.2 4.3  Hemoglobin 11.1 - 15.9 g/dL 45.4 09.8 11.9  Hematocrit 34.0 - 46.6 % 38.4 40.4 39.2  Platelets 150 - 450 x10E3/uL 285 269 292   CMP Latest Ref Rng & Units 06/14/2018 12/16/2016 12/10/2016  Glucose 65 - 99 mg/dL 93 - 147(W)  BUN 6 - 20 mg/dL 9 - 16  Creatinine 2.95 - 1.00 mg/dL 6.21 - 3.08  Sodium 657 - 144 mmol/L 143 - 136  Potassium 3.5 - 5.2 mmol/L 4.3 - 3.5  Chloride 96 - 106 mmol/L 103 - 101  CO2 20 - 29 mmol/L 23 - 22  Calcium 8.7 - 10.2 mg/dL 84.6 - 9.3  Total Protein 6.0 - 8.5 g/dL 7.3 7.2 7.3  Total Bilirubin 0.0 - 1.2 mg/dL 9.6(E) 2.9(H) 4.5(H)  Alkaline Phos 39 - 117 IU/L 56 40 42  AST 0 - 40 IU/L ALT 0 - 32 IU/L When T bili 2.9, indirect was 2.7  H pylori stool antigen negative  Radiologic Studies:  IOC Nov 2018 (at [facility]) with filling defect in CBD - ? Air bubble vs stone   Assessment: Encounter Diagnoses  Name Primary?  . Epigastric pain Yes  . Gilbert's syndrome     3 weeks of burning epigastric discomfort without associated symptoms or red flag signs.  Blood work reassuring.  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  Stool H. pylori antigen has good negative predictive value, she does not take NSAIDs, so peptic ulcer seems unlikely in this young healthy person.  Cause of symptoms unclear, she continues to be bothered by it.  Offered an upper endoscopy for further investigation.  Alternatively, I think a longer trial of PPI could be helpful,  since 2 days would not likely give improvement if this is acid mediated.  She has opted for a trial of the omeprazole 20 mg twice daily.  She will not take the Pepcid since it is less potent acid suppression.  Aundra MilletMegan also asked if the abdominal ultrasound was likely to be helpful.  I have recommended she not schedule it since I think it is unlikely to be revealing in this situation.  Plan:  She will contact us in about 2 weeks with an update.  If not improved, she may then elect to proceed with EGD.  Thank you for the courtesy of this consult.  Please call me with any questions or concerns.  Charlie PitterHenry L Danis III  CC: Referring provider noted above

## 2018-06-29 ENCOUNTER — Other Ambulatory Visit: Payer: 59

## 2018-07-02 ENCOUNTER — Other Ambulatory Visit: Payer: 59

## 2018-07-05 ENCOUNTER — Other Ambulatory Visit: Payer: 59

## 2018-08-15 ENCOUNTER — Telehealth: Payer: Self-pay | Admitting: Certified Nurse Midwife

## 2018-08-15 DIAGNOSIS — Z309 Encounter for contraceptive management, unspecified: Secondary | ICD-10-CM

## 2018-08-15 NOTE — Telephone Encounter (Signed)
To Rosa to precert IUD removal.

## 2018-08-15 NOTE — Telephone Encounter (Signed)
agreeable

## 2018-08-15 NOTE — Telephone Encounter (Signed)
Spoke with patient. She wants to schedule appointment to have Mirena IUD removed. Ready to try for pregnancy. Patient a little concerned about procedure due to hx of vasovagal syncope. Advised removing IUD is less difficulty than insertion sometimes can be. Advised her to eat prior to office visit. She felt reassured. Routed to provider for any further recommendations.

## 2018-08-15 NOTE — Telephone Encounter (Signed)
Patient would like to schedule mirena removal.

## 2018-08-21 NOTE — Telephone Encounter (Signed)
Call placed to patient to get new health insurance information. Spoke with patient she is going to get her new information from her new employer. Patient to return call with new information before scheduled appointment 08/28/18.

## 2018-08-21 NOTE — Telephone Encounter (Signed)
Patient returned call

## 2018-08-21 NOTE — Telephone Encounter (Signed)
Returned call to patient. Left message for return call.

## 2018-08-22 ENCOUNTER — Telehealth: Payer: Self-pay | Admitting: Certified Nurse Midwife

## 2018-08-22 NOTE — Telephone Encounter (Signed)
UNABLE to leave message no voicemail set up.

## 2018-08-22 NOTE — Telephone Encounter (Signed)
Spoke with patient and moved appointment to 08/31/18 at 2:30pm for IUD removal.

## 2018-08-22 NOTE — Telephone Encounter (Signed)
Patient has appointment scheduled for iud removal 7/14 in the morning and would like to reschedule to a afternoon if possible.

## 2018-08-24 ENCOUNTER — Other Ambulatory Visit: Payer: Self-pay

## 2018-08-24 ENCOUNTER — Encounter: Payer: Self-pay | Admitting: Certified Nurse Midwife

## 2018-08-24 ENCOUNTER — Ambulatory Visit (INDEPENDENT_AMBULATORY_CARE_PROVIDER_SITE_OTHER): Payer: 59 | Admitting: Certified Nurse Midwife

## 2018-08-24 VITALS — BP 110/72 | HR 60 | Temp 97.4°F | Resp 16 | Wt 111.0 lb

## 2018-08-24 DIAGNOSIS — Z30432 Encounter for removal of intrauterine contraceptive device: Secondary | ICD-10-CM

## 2018-08-24 DIAGNOSIS — Z789 Other specified health status: Secondary | ICD-10-CM | POA: Diagnosis not present

## 2018-08-24 DIAGNOSIS — Z309 Encounter for contraceptive management, unspecified: Secondary | ICD-10-CM

## 2018-08-24 NOTE — Patient Instructions (Signed)
Preparing for Pregnancy If you are considering becoming pregnant, make an appointment to see your regular health care provider to learn how to prepare for a safe and healthy pregnancy (preconception care). During a preconception care visit, your health care provider will:  Do a complete physical exam, including a Pap test.  Take a complete medical history.  Give you information, answer your questions, and help you resolve problems. Preconception checklist Medical history  Tell your health care provider about any current or past medical conditions. Your pregnancy or your ability to become pregnant may be affected by chronic conditions, such as diabetes, chronic hypertension, and thyroid problems.  Include your family's medical history as well as your partner's medical history.  Tell your health care provider about any history of STIs (sexually transmitted infections).These can affect your pregnancy. In some cases, they can be passed to your baby. Discuss any concerns that you have about STIs.  If indicated, discuss the benefits of genetic testing. This testing will show whether there are any genetic conditions that may be passed from you or your partner to your baby.  Tell your health care provider about: ? Any problems you have had with conception or pregnancy. ? Any medicines you take. These include vitamins, herbal supplements, and over-the-counter medicines. ? Your history of immunizations. Discuss any vaccinations that you may need. Diet  Ask your health care provider what to include in a healthy diet that has a balance of nutrients. This is especially important when you are pregnant or preparing to become pregnant.  Ask your health care provider to help you reach a healthy weight before pregnancy. ? If you are overweight, you may be at higher risk for certain complications, such as high blood pressure, diabetes, and preterm birth. ? If you are underweight, you are more likely to  have a baby who has a low birth weight. Lifestyle, work, and home  Let your health care provider know: ? About any lifestyle habits that you have, such as alcohol use, drug use, or smoking. ? About recreational activities that may put you at risk during pregnancy, such as downhill skiing and certain exercise programs. ? Tell your health care provider about any international travel, especially any travel to places with an active Zika virus outbreak. ? About harmful substances that you may be exposed to at work or at home. These include chemicals, pesticides, radiation, or even litter boxes. ? If you do not feel safe at home. Mental health  Tell your health care provider about: ? Any history of mental health conditions, including feelings of depression, sadness, or anxiety. ? Any medicines that you take for a mental health condition. These include herbs and supplements. Home instructions to prepare for pregnancy Lifestyle   Eat a balanced diet. This includes fresh fruits and vegetables, whole grains, lean meats, low-fat dairy products, healthy fats, and foods that are high in fiber. Ask to meet with a nutritionist or registered dietitian for assistance with meal planning and goals.  Get regular exercise. Try to be active for at least 30 minutes a day on most days of the week. Ask your health care provider which activities are safe during pregnancy.  Do not use any products that contain nicotine or tobacco, such as cigarettes and e-cigarettes. If you need help quitting, ask your health care provider.  Do not drink alcohol.  Do not take illegal drugs.  Maintain a healthy weight. Ask your health care provider what weight range is right for you. General   instructions  Keep an accurate record of your menstrual periods. This makes it easier for your health care provider to determine your baby's due date.  Begin taking prenatal vitamins and folic acid supplements daily as directed by your  health care provider.  Manage any chronic conditions, such as high blood pressure and diabetes, as told by your health care provider. This is important. How do I know that I am pregnant? You may be pregnant if you have been sexually active and you miss your period. Symptoms of early pregnancy include:  Mild cramping.  Very light vaginal bleeding (spotting).  Feeling unusually tired.  Nausea and vomiting (morning sickness). If you have any of these symptoms and you suspect that you might be pregnant, you can take a home pregnancy test. These tests check for a hormone in your urine (human chorionic gonadotropin, or hCG). A woman's body begins to make this hormone during early pregnancy. These tests are very accurate. Wait until at least the first day after you miss your period to take one. If the test shows that you are pregnant (you get a positive result), call your health care provider to make an appointment for prenatal care. What should I do if I become pregnant?      Make an appointment with your health care provider as soon as you suspect you are pregnant.  Do not use any products that contain nicotine, such as cigarettes, chewing tobacco, and e-cigarettes. If you need help quitting, ask your health care provider.  Do not drink alcoholic beverages. Alcohol is related to a number of birth defects.  Avoid toxic odors and chemicals.  You may continue to have sexual intercourse if it does not cause pain or other problems, such as vaginal bleeding. This information is not intended to replace advice given to you by your health care provider. Make sure you discuss any questions you have with your health care provider. Document Released: 01/14/2008 Document Revised: 02/02/2017 Document Reviewed: 08/23/2015 Elsevier Patient Education  2020 [employer]        

## 2018-08-24 NOTE — Progress Notes (Signed)
Review of Systems  Constitutional: Negative.   HENT: Negative.   Eyes: Negative.   Respiratory: Negative.   Cardiovascular: Negative.   Gastrointestinal: Negative.   Genitourinary: Negative.   Musculoskeletal: Negative.   Skin: Negative.   Neurological: Negative.   Endo/Heme/Allergies: Negative.   Psychiatric/Behavioral: Negative.      48 yrsCaucasian  Married G1P0 LMP: spotting last month.  Presents for Mirena IUD removal.  Denies any vaginal symptoms or pelvic pain. Plans for contraception are condoms and then try for pregnancy. HPI neg. Patient has not started on Prenatal vitamins yet. Requests Mirena IUD removal. Consent read and signed.No health issues today.  Exam: Healthy WDWN female Abdomen: soft non-tender Groin:no inguinal nodes palpated    Pelvic exam: VULVA: normal appearing vulva with no masses, tenderness or lesions,  VAGINA: normal appearing vagina with normal color and discharge, no lesions,  CERVIX: normal appearing cervix without discharge or lesions,  UTERUS: uterus is normal size, shape, consistency and nontender,  ADNEXA: normal adnexa in size, nontender and no masses,   Procedure: Speculum placed, cervix visualized.  IUD string visualized, grasp with ring forceps, with gentle traction IUD removed intact.  IUD shown to patient and discarded. Speculum removed.   Assessment:Mirena removal Pt tolerated procedure well.  Plan: Begin contraceptive choice of condoms, start charting menstrual cycle for the next 2 months. This allows good endometrium to develop to get ready for pregnancy. Given booklet on preparing for pregnancy. Recommended Rubella titer agree. Will need to call when has positive UPT at home for confirmation here. Questions addressed. Lab : [facility]     Return Visit prn, aex

## 2018-08-25 LAB — RUBELLA SCREEN: Rubella Antibodies, IGG: 2.89 index (ref >0.99)

## 2018-08-28 ENCOUNTER — Ambulatory Visit: Payer: 59 | Admitting: Certified Nurse Midwife

## 2018-08-31 ENCOUNTER — Ambulatory Visit: Payer: 59 | Admitting: Certified Nurse Midwife

## 2018-09-03 NOTE — Telephone Encounter (Signed)
Patient had appointment for IUD removal 08-31-18 but cancelled and hasn't called back to reschedule.

## 2018-09-05 ENCOUNTER — Encounter: Payer: Self-pay | Admitting: Certified Nurse Midwife

## 2018-09-05 ENCOUNTER — Telehealth: Payer: Self-pay | Admitting: Certified Nurse Midwife

## 2018-09-05 ENCOUNTER — Other Ambulatory Visit: Payer: Self-pay

## 2018-09-05 ENCOUNTER — Ambulatory Visit: Payer: 59 | Admitting: Certified Nurse Midwife

## 2018-09-05 VITALS — BP 98/60 | HR 60 | Temp 97.9°F | Resp 16 | Wt 112.0 lb

## 2018-09-05 DIAGNOSIS — Z0000 Encounter for general adult medical examination without abnormal findings: Secondary | ICD-10-CM

## 2018-09-05 DIAGNOSIS — Z01419 Encounter for gynecological examination (general) (routine) without abnormal findings: Secondary | ICD-10-CM | POA: Diagnosis not present

## 2018-09-05 NOTE — Telephone Encounter (Signed)
Spoke with patient. IUD removed 08/24/18. LMP 7/12-7/15. Reports bleeding after BM this morning, no longer bleeding. No straining with BM. Denies pain or hemorrhoids. Denies vaginal bleeding. [id number] pre screen negative, precautions reviewed. OV scheduled for today at 4:30pm with Melvia Heaps, CNM. Patient verbalizes understanding and is agreeable.   Encounter closed.

## 2018-09-05 NOTE — Telephone Encounter (Signed)
Patient sent the following correspondence through [place]. Routing to triage to assist patient with request.  I had my IUD taken out on 7/10 (trying for a baby). I had my period 7/12-7/15. I just went to the bathroom (including bowel movement) and a lot of blood came out. I wasnt sure if this is normal after an IUD is taken out.

## 2018-09-05 NOTE — Progress Notes (Signed)
Review of Systems  Constitutional: Negative.   HENT: Negative.   Eyes: Negative.   Respiratory: Negative.   Cardiovascular: Negative.   Gastrointestinal: Negative.   Genitourinary: Negative.   Musculoskeletal: Negative.   Skin: Negative.    S: 28 yo g1p1001 here complaining of seeing blood in toilet after urinating and having bowel movement. Noted no blood on toilet paper from bladder or from rectum. Denies black tarry stool or blood on stool. No history of hemorrhoid. Recent removal of IUD and patient felt like it may have blood from cervix, as passing some mucous. Denies abdominal pain, cramping, urinary frequency or diarrhea or constipation. No change in diet. No fever or chills or nausea. No other concerns.  Physical Exam Exam conducted with a chaperone present.  Constitutional:      Appearance: Normal appearance.  Cardiovascular:     Rate and Rhythm: Normal rate.  Pulmonary:     Effort: Pulmonary effort is normal.  Abdominal:     General: There is no distension.     Palpations: Abdomen is soft. There is no mass.     Tenderness: There is no abdominal tenderness. There is no guarding or rebound.  Genitourinary:    General: Normal vulva.     Labia:        Right: No rash, tenderness or lesion.        Left: No rash, tenderness or lesion.      Urethra: No urethral pain, urethral swelling or urethral lesion.     Vagina: Normal. No vaginal discharge, tenderness, bleeding or lesions.     Cervix: No cervical motion tenderness, discharge, friability or cervical bleeding.     Uterus: Normal. Not tender.      Adnexa: Left adnexa normal.       Right: No mass, tenderness or fullness.         Left: No mass, tenderness or fullness.       Rectum: Normal. No tenderness, anal fissure, external hemorrhoid or internal hemorrhoid.     Comments: No evidence of blood in vaginal vault or from swabbing cervix Anal scope used and no blood present in rectal opening, no hemorrhoid noted or tear or  cracking. Swabbed area and no presence of blood or stool.  Lymphadenopathy:     Lower Body: No right inguinal adenopathy. No left inguinal adenopathy.  Neurological:     Mental Status: She is alert.  Psychiatric:        Attention and Perception: Attention normal.        Mood and Affect: Mood and affect normal.        Speech: Speech normal.        Behavior: Behavior normal. Behavior is cooperative.        Thought Content: Thought content normal.        Cognition and Memory: Cognition and memory normal.    A: Blood in toilet unknown source Normal pelvic and rectal exam  P: Discussed possible from cervix with recent IUD removal. Discussed possible hemorrhoid not visible that bleed with stool, but not seen. Will do Colace stool softener and observe stool. Will advise if notes again or black tarry stool. Questions addressed.  Rv prn

## 2018-09-06 ENCOUNTER — Telehealth: Payer: Self-pay | Admitting: Gastroenterology

## 2018-09-06 ENCOUNTER — Telehealth: Payer: Self-pay | Admitting: Certified Nurse Midwife

## 2018-09-06 DIAGNOSIS — K625 Hemorrhage of anus and rectum: Secondary | ICD-10-CM

## 2018-09-06 NOTE — Telephone Encounter (Signed)
Please refer to GI of choice.

## 2018-09-06 NOTE — Telephone Encounter (Signed)
Brittani now has Dr. Danis. 

## 2018-09-06 NOTE — Progress Notes (Deleted)
   Subjective:    Patient ID: Courtney Barnett, female    DOB: Aug 08, 1990, 28 y.o.   MRN: [id number]  HPI:  Ms. Courtney Barnett presents with   Patient Care Team    Relationship Specialty Notifications Start End  Esaw Grandchild, NP PCP - General Family Medicine  04/07/16     Patient Active Problem List   Diagnosis Date Noted  . Epigastric pain 06/12/2018  . GAD (generalized anxiety disorder) 03/19/2018  . Amenorrhea 03/19/2018  . Joint pain in fingers of left hand 12/04/2017  . 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 Acute bronchitis 06/16/2017  . Calculus of gallbladder without cholecystitis without obstruction   . Biliary colic 29/52/8413  . 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 Healthcare maintenance 04/07/2016     Past Medical History:  Diagnosis Date  . Anxiety   . Biliary colic 24/40/1027  . Vasovagal syncope      Past Surgical History:  Procedure Laterality Date  . BREAST SURGERY Left 03/2018   biopsy  . CHOLECYSTECTOMY  12/20/2016   Procedure: LAPAROSCOPIC CHOLECYSTECTOMY WITH INTRAOPERATIVE CHOLANGIOGRAM;  Surgeon: Vickie Epley, MD;  Location: [facility] ORS;  Service: General;;  . INTRAUTERINE DEVICE (IUD) INSERTION  09/2013  . WISDOM TOOTH EXTRACTION       Family History  Problem Relation Age of Onset  . Diabetes Maternal Grandfather   . Colon cancer Neg Hx   . Stomach cancer Neg Hx   . Pancreatic cancer Neg Hx      Social History   Substance and Sexual Activity  Drug Use No     Social History   Substance and Sexual Activity  Alcohol Use No     Social History   Tobacco Use  Smoking Status Never Smoker  Smokeless Tobacco Never Used     Outpatient Encounter Medications as of 09/10/2018  Medication Sig  . Prenatal Vit-Fe Fumarate-FA (PRENATAL VITAMIN PO) Take by mouth.   No facility-administered encounter medications on file as of 09/10/2018.     Allergies: Patient has no known allergies.  There is no height or weight on file to calculate  BMI.  Last menstrual period 08/26/2018.     Review of Systems     Objective:   Physical Exam        Assessment & Plan:  No diagnosis found.  No problem-specific Assessment & Plan notes found for this encounter.    FOLLOW-UP:  No follow-ups on file.

## 2018-09-06 NOTE — Telephone Encounter (Signed)
Dr. Quincy Simmonds -please review and advise, patient was seen in office by Melvia Heaps, CNM on 09/05/18.

## 2018-09-06 NOTE — Telephone Encounter (Signed)
I see that she had an IUD removed recently, and that she was seen by gynecology yesterday for the bleeding because there was the suspicion it may have been vaginal rather than rectal bleeding. They did not find any abnormality on pelvic or rectal exam with anoscopy.  If she is certain that it was bleeding with a bowel movement, and if it is ongoing, please get her in with next available APP appointment for evaluation.

## 2018-09-06 NOTE — Telephone Encounter (Signed)
The patient reports that the blood is definitely rectal. She said is it mixed in the stool. The earliest an APP was available was 8/13 so the patient wanted to keep her appointment with Dr. Loletha Carrow for the 8/18. The patient was told to call back if the bleeding continued or worsened. Patient verbalized understanding. Nothing further at the time of the call.

## 2018-09-06 NOTE — Telephone Encounter (Signed)
Patient is calling regarding rectal bleeding. Patient was seen yesterday and told to call back if it happens again. Patient stated that she just has a bowel movement and she experienced rectal bleeding again. Patient stated that the blood was all in her stool.

## 2018-09-06 NOTE — Telephone Encounter (Signed)
Spoke with patient. Reports bleeding again this morning, not currently bleeding, no straining with BM. Patient states she has a Hx of syncope, had some dizziness this morning, is concerned she ay have another syncope episode. Patient states syncope has not happened in a couple of years. Advised as seen below per Dr. Quincy Simmonds. Patient request referral to Dr. Loletha Carrow at [facility], has seen him in the past for gallbladder removal. Advised patient if symptoms worsen or new symptoms develop she should seek further evaluation at ER/Urgent care. Advised patient she should f/u with PCP for further evaluation of syncope. Advised she will be contacted with appt details for GI once appt scheduled.   Ambulatory referral placed to [facility]/ Dr. Loletha Carrow.   Routing to provider for final review. Patient is agreeable to disposition. Will close encounter.  Cc: Melvia Heaps, CNM, Magdalene Patricia

## 2018-09-06 NOTE — Telephone Encounter (Signed)
Spoke to the patient who reports sudden rectal bleeding with her only 2 bowel movements in 36 hours (soft, loose and mushy) that started at 8:00 am on 7/22. The patient reports no prior history of rectal bleeding. She describes the blood as "darker red" in color, "the same color as menstrual blood." She endorses weakness, lightheadedness and feeling like "I'm going to pass out" but also reports she has syncope and cannot tell if this feeling is related to the rectal bleeding. It is difficult for the patient to quantify the amount of blood but she feels like its a moderate to large amount. Does not have any other symptoms, denies abdominal pain. Not on blood thinners, no recent surgeries or procedures. Please advise.

## 2018-09-07 NOTE — Telephone Encounter (Signed)
If she is agreeable and available, please set her up for a telemedicine appointment with me next Tuesday, July 28 at 4 PM. On the schedule for that visit, please note the mobile number for contact, and let her know how she would receive a text with a link to the conference.  If the bleeding has stopped, and she would rather wait for her in person visit with me next month, that is fine as well.

## 2018-09-07 NOTE — Telephone Encounter (Signed)
This patient has been scheduled on 7/28 at 4:00 pm.

## 2018-09-10 ENCOUNTER — Ambulatory Visit: Payer: 59 | Admitting: Adult Health

## 2018-09-11 ENCOUNTER — Encounter: Payer: Self-pay | Admitting: Gastroenterology

## 2018-09-11 ENCOUNTER — Ambulatory Visit (INDEPENDENT_AMBULATORY_CARE_PROVIDER_SITE_OTHER): Payer: 59 | Admitting: Gastroenterology

## 2018-09-11 DIAGNOSIS — K921 Melena: Secondary | ICD-10-CM

## 2018-09-11 NOTE — Progress Notes (Signed)
This patient contacted our office requesting a physician telemedicine consultation regarding clinical questions and/or test results. Due to COVID restrictions, this was felt to be the most appropriate method of patient evaluation.  Participants on the conference : myself and patient   The patient consented to this consultation and was aware that a charge will be placed through their insurance.  They were also made aware of the limitations of telemedicine.  I was in my office and the patient was in her parked car   Encounter time:  Total time 22 minutes, with 18 minutes spent with patient on [facility]   _____________________________________________________________________________________________               Courtney Barnett GI Progress Note  Chief Complaint: hematochezia  Subjective  History: Telemedicine May 2020 for epigastric pain, which has since resolved  She has not previously had GI bleeding.  IUD was removed on July 10, then on the 23rd she passed a reportedly large amount of maroon or rust colored blood in the toilet bowl.  At first she could not determine if it was vaginal or rectal, and assumed it might be related to the recent IUD removal.  She saw gynecology that day, and I reviewed their note.  Pelvic exam was normal, rectal exam and anoscopy reportedly normal as well.  The next day, Lasheika felt fairly sure the blood was mixed with stool, and then the blood gradually decreased over the next couple of days and now there is a scant amount with today's bowel movement.  The stool was loose for a few days, but now back to normal.  She did not have fever, nausea, vomiting or sick contacts.  No recent travel or antibiotic use, no cough or sputum production or known COVID exposure.  Since the recent IUD removal, she and her husband have been trying to get pregnant, and she is currently scheduled to have a pregnancy test in about 2 weeks.  ROS: Cardiovascular:  no chest pain  Respiratory: no dyspnea  The patient's Past Medical, Family and Social History were reviewed and are on file in the EMR.  Objective:  Med list reviewed  Current Outpatient Medications:  .  Prenatal Vit-Fe Fumarate-FA (PRENATAL VITAMIN PO), Take by mouth., Disp: , Rfl:    No exam-virtual visit  No data for review    @ASSESSMENTPLANBEGIN @ Assessment: Encounter Diagnosis  Name Primary?  . Hematochezia Yes   Unusual episode, seems now almost completely resolved.  She had not had previous lower GI bleeding. It was relatively soon after IUD removal, and she described the bleeding as being darker in color with some clots.  After a couple of days she felt fairly certain that it was lower GI bleeding.  It is not clear how or if that could be related to the recent IUD removal.  After hearing this, I had planned to perform a colonoscopy soon to investigate the bleeding, but she is actively trying to become pregnant and thus could be pregnant right now while awaiting her test in 2 weeks.  Therefore, we will have to wait for those test results.  If negative, we can proceed with colonoscopy shortly afterwards. I asked her to contact us with the pregnancy test result, and certainly sooner if she has recurrent bleeding.   Nelida Meuse III

## 2018-09-14 NOTE — Telephone Encounter (Signed)
Okay to close encounter?  °

## 2018-09-17 ENCOUNTER — Ambulatory Visit: Payer: 59 | Admitting: Adult Health

## 2018-10-02 ENCOUNTER — Other Ambulatory Visit: Payer: 59

## 2018-10-02 ENCOUNTER — Ambulatory Visit: Payer: 59 | Admitting: Gastroenterology

## 2018-10-28 ENCOUNTER — Encounter (HOSPITAL_COMMUNITY): Payer: Self-pay

## 2018-10-28 ENCOUNTER — Inpatient Hospital Stay (HOSPITAL_COMMUNITY)
Admission: AD | Admit: 2018-10-28 | Discharge: 2018-10-28 | Disposition: A | Discharge status: Home / Self Care | Payer: 59 | Attending: Obstetrics and Gynecology | Admitting: Obstetrics and Gynecology

## 2018-10-28 ENCOUNTER — Inpatient Hospital Stay (HOSPITAL_COMMUNITY): Payer: 59

## 2018-10-28 ENCOUNTER — Other Ambulatory Visit: Payer: Self-pay

## 2018-10-28 DIAGNOSIS — Z9049 Acquired absence of other specified parts of digestive tract: Secondary | ICD-10-CM | POA: Insufficient documentation

## 2018-10-28 DIAGNOSIS — O209 Hemorrhage in early pregnancy, unspecified: Secondary | ICD-10-CM

## 2018-10-28 DIAGNOSIS — Z833 Family history of diabetes mellitus: Secondary | ICD-10-CM | POA: Insufficient documentation

## 2018-10-28 DIAGNOSIS — O9989 Other specified diseases and conditions complicating pregnancy, childbirth and the puerperium: Secondary | ICD-10-CM | POA: Insufficient documentation

## 2018-10-28 DIAGNOSIS — R109 Unspecified abdominal pain: Secondary | ICD-10-CM | POA: Diagnosis not present

## 2018-10-28 DIAGNOSIS — Z3A01 Less than 8 weeks gestation of pregnancy: Secondary | ICD-10-CM | POA: Diagnosis not present

## 2018-10-28 DIAGNOSIS — O26899 Other specified pregnancy related conditions, unspecified trimester: Secondary | ICD-10-CM

## 2018-10-28 DIAGNOSIS — O3680X0 Pregnancy with inconclusive fetal viability, not applicable or unspecified: Secondary | ICD-10-CM | POA: Insufficient documentation

## 2018-10-28 LAB — WET PREP, GENITAL
Clue Cells Wet Prep HPF POC: NONE SEEN
Sperm: NONE SEEN
Trich, Wet Prep: NONE SEEN
Yeast Wet Prep HPF POC: NONE SEEN

## 2018-10-28 LAB — CBC
HCT: 38.7 % (ref 36.0–46.0)
Hemoglobin: 12.9 g/dL (ref 12.0–15.0)
MCH: 29.6 pg (ref 26.0–34.0)
MCHC: 33.3 g/dL (ref 30.0–36.0)
MCV: 88.8 fL (ref 80.0–100.0)
Platelets: 284 10*3/uL (ref 150–400)
RBC: 4.36 MIL/uL (ref 3.87–5.11)
RDW: 12.1 % (ref 11.5–15.5)
WBC: 4.9 10*3/uL (ref 4.0–10.5)
nRBC: 0 % (ref 0.0–0.2)

## 2018-10-28 LAB — HCG, QUANTITATIVE, PREGNANCY: hCG, Beta Chain, Quant, S: 34 m[IU]/mL — ABNORMAL HIGH (ref <5)

## 2018-10-28 LAB — POCT PREGNANCY, URINE: Preg Test, Ur: NEGATIVE

## 2018-10-28 LAB — TYPE AND SCREEN
ABO/RH(D): O POS
Antibody Screen: NEGATIVE

## 2018-10-28 LAB — ABO/RH: ABO/RH(D): O POS

## 2018-10-28 NOTE — Discharge Instructions (Signed)
You have provided information on ectopic pregnancy and threatened miscarriage for information only. You have NOT been diagnosed with either of those at this time.  Return to MAU:  If you have heavier bleeding that soaks through more that 2 pads per hour for an hour or more  If you bleed so much that you feel like you might pass out or you do pass out  If you have significant abdominal pain that is not improved with Tylenol   If you develop a fever > 100.5

## 2018-10-28 NOTE — MAU Provider Note (Signed)
History     CSN: [id number]  Arrival date and time: 10/28/18 2334   First Provider Initiated Contact with Patient 10/28/18 1150      Chief Complaint  Patient presents with  . Abdominal Pain  . Vaginal Bleeding  . Possible Pregnancy   HPI  Ms.  Courtney Barnett is a 28 y.o. year old G57P1001 female at [redacted]w[redacted]d weeks gestation by LMP who presents to MAU reporting a (+) HPT on 10/27/18 & 10/28/18, spotting last night, but now heavier VB "like a period". She also complains of abdominal pain/cramping that is "like period cramps". She denies any recent SI; last was 10/07/18.    Past Medical History:  Diagnosis Date  . Anxiety   . Biliary colic 12/13/2016  . Vasovagal syncope     Past Surgical History:  Procedure Laterality Date  . BREAST SURGERY Left 03/2018   biopsy  . CHOLECYSTECTOMY  12/20/2016   Procedure: LAPAROSCOPIC CHOLECYSTECTOMY WITH INTRAOPERATIVE CHOLANGIOGRAM;  Surgeon: Ancil Linsey, MD;  Location: [facility] ORS;  Service: General;;  . INTRAUTERINE DEVICE (IUD) INSERTION  09/2013  . WISDOM TOOTH EXTRACTION      Family History  Problem Relation Age of Onset  . Diabetes Maternal Grandfather   . Colon cancer Neg Hx   . Stomach cancer Neg Hx   . Pancreatic cancer Neg Hx     Social History   Tobacco Use  . Smoking status: Never Smoker  . Smokeless tobacco: Never Used  Substance Use Topics  . Alcohol use: No  . Drug use: No    Allergies: No Known Allergies  No medications prior to admission.    Review of Systems  Constitutional: Negative.   HENT: Negative.   Eyes: Negative.   Respiratory: Negative.   Cardiovascular: Negative.   Gastrointestinal: Negative.   Endocrine: Negative.   Genitourinary: Positive for pelvic pain ("like period cramps") and vaginal bleeding (heavy like a period).  Musculoskeletal: Negative.   Skin: Negative.   Allergic/Immunologic: Negative.   Neurological: Negative.   Hematological: Negative.   Psychiatric/Behavioral:  Negative.    Physical Exam   Blood pressure 110/78, pulse 95, temperature 98.2 F (36.8 C), temperature source Oral, resp. rate 16, height 5\' 3"  (1.6 m), weight 52.5 kg, last menstrual period 09/22/2018, SpO2 100 %.  Physical Exam  Nursing note and vitals reviewed. Constitutional: She is oriented to person, place, and time. She appears well-developed and well-nourished.  HENT:  Head: Normocephalic and atraumatic.  Eyes: Pupils are equal, round, and reactive to light.  Neck: Normal range of motion.  Cardiovascular: Normal rate.  Respiratory: Effort normal.  GI: Soft.  Genitourinary:    Genitourinary Comments: Uterus: non-tender, SE: cervix is smooth, pink, no lesions, moderate amt of thick, bright, red blood -- WP, GC/CT done, closed/long/firm, no CMT or friability, mild adnexal tenderness bilaterally   Musculoskeletal: Normal range of motion.  Neurological: She is alert and oriented to person, place, and time. She has normal reflexes.  Skin: Skin is warm and dry.  Psychiatric: She has a normal mood and affect. Her behavior is normal. Judgment and thought content normal.    MAU Course  Procedures  MDM [facility] UPT CBC ABO/Rh HCG Wet Prep GC/CT -- pending HIV -- pending OB < 14 wks [place] with TV  *Consult with Dr. Emelda Fear @ 1320 - notified of patient's complaints, assessments, lab & U/S results, tx plan repeat HCG in 48 hrs, give ectopic and SAB precautions - ok to d/c home, agrees with plan  Dr. Emelda FearFerguson reviewed U/S images  Results for orders placed or performed during the hospital encounter of 10/28/18 (from the past 24 hour(s))  Pregnancy, urine POC     Status: None   Collection Time: 10/28/18  9:28 AM  Result Value Ref Range   Preg Test, Ur NEGATIVE NEGATIVE  CBC     Status: None   Collection Time: 10/28/18  9:41 AM  Result Value Ref Range   WBC 4.9 4.0 - 10.5 K/uL   RBC 4.36 3.87 - 5.11 MIL/uL   Hemoglobin 12.9 12.0 - 15.0 g/dL   HCT 16.138.7 09.636.0 - 04.546.0 %   MCV 88.8  80.0 - 100.0 fL   MCH 29.6 26.0 - 34.0 pg   MCHC 33.3 30.0 - 36.0 g/dL   RDW 40.912.1 81.111.5 - 91.415.5 %   Platelets 284 150 - 400 K/uL   nRBC 0.0 0.0 - 0.2 %  hCG, quantitative, pregnancy     Status: Abnormal   Collection Time: 10/28/18  9:44 AM  Result Value Ref Range   hCG, Beta Chain, Quant, S 34 (H) <5 mIU/mL  Type and screen     Status: None   Collection Time: 10/28/18  9:44 AM  Result Value Ref Range   ABO/RH(D) O POS    Antibody Screen NEG    Sample Expiration      10/31/2018,2359 Performed at [facility], [address] [place], [place] [address]   ABO/Rh     Status: None   Collection Time: 10/28/18  9:44 AM  Result Value Ref Range   ABO/RH(D)      O POS Performed at [facility], [address] [place], [place] [address]   Wet prep, genital     Status: Abnormal   Collection Time: 10/28/18 12:26 PM  Result Value Ref Range   Yeast Wet Prep HPF POC NONE SEEN NONE SEEN   Trich, Wet Prep NONE SEEN NONE SEEN   Clue Cells Wet Prep HPF POC NONE SEEN NONE SEEN   WBC, Wet Prep HPF POC FEW (A) NONE SEEN   Sperm NONE SEEN     [place] Ob Less Than 14 Weeks With Ob Transvaginal  Result Date: 10/28/2018 CLINICAL DATA:  Pregnant patient with vaginal bleeding. EXAM: OBSTETRIC <14 WK US AND TRANSVAGINAL OB US TECHNIQUE: Both transabdominal and transvaginal ultrasound examinations were performed for complete evaluation of the gestation as well as the maternal uterus, adnexal regions, and pelvic cul-de-sac. Transvaginal technique was performed to assess early pregnancy. COMPARISON:  None. FINDINGS: Intrauterine gestational sac: None Yolk sac:  Not Visualized. Embryo:  Not Visualized. Cardiac Activity: Not Visualized. Maternal uterus/adnexae: Multiple follicles within the right and left ovary. Moderate volume fluid within the pelvis. No definite adnexal mass identified separate from the right or left ovary. IMPRESSION: No intrauterine gestation identified. In the setting of positive  pregnancy test and no definite intrauterine pregnancy, this reflects a pregnancy of unknown location. Differential considerations include early normal IUP, abnormal IUP, or nonvisualized ectopic pregnancy. Differentiation is achieved with serial beta HCG supplemented by repeat sonography as clinically warranted. Moderate volume free fluid in the pelvis. Electronically Signed   By: Annia Beltrew  Davis M.D.   On: 10/28/2018 14:12     Assessment and Plan  Pregnancy of unknown anatomic location - Advised that ectopic vs miscarriage cannot be determined at this time - Information provided on ectopic pregnancy and threatened miscarriage   Bleeding in early pregnancy  - Return to MAU:  If you  have heavier bleeding that soaks through more that 2 pads per hour for an hour or more  If you bleed so much that you feel like you might pass out or you do pass out  If you have significant abdominal pain that is not improved with Tylenol   If you develop a fever > 100.5 - Information provided on VB in pregnancy   Abdominal pain in pregnancy - Advised to take Tylenol 1000 mg every 6 hours prn pain - Information provided on abd pain in pregnancy   - Discharge home - Keep appointment with [facility] on 10/30/2018 for repeat HCG - Patient verbalized an understanding of the plan of care and agrees.    Laury Deep, MSN, CNM 10/28/2018, 11:50 AM

## 2018-10-28 NOTE — MAU Note (Signed)
Courtney Barnett is a 28 y.o. here in MAU reporting: started spotting last night and the bleeding got heavier this morning, states it is like a period. Put a pad on this AM and thinks it probably needs to be changed now. Also started cramping last night. + UPT at home yesterday and today  LMP: 09/22/2018  Onset of complaint: last night  Pain score: 3/10  Vitals:   10/28/18 0948  BP: 111/66  Pulse: 100  Resp: 16  Temp: 98.2 F (36.8 C)  SpO2: 100%      Lab orders placed from triage: UPT, HCG, CBC, T&S

## 2018-10-29 ENCOUNTER — Telehealth: Payer: Self-pay | Admitting: Certified Nurse Midwife

## 2018-10-29 NOTE — Telephone Encounter (Signed)
Message left to return call to Triage Nurse at [phone].    

## 2018-10-29 NOTE — Telephone Encounter (Signed)
Patient sent the following correspondence through [facility].    I took a positive pregnancy test this morning. Am I able to come in early Monday morning to get the official blood work done? Thank you!

## 2018-10-29 NOTE — Telephone Encounter (Signed)
Returned call to patient to follow up on MyChart message sent 10-27-2018. Patient was seen in ER yesterday, 10-28-2018, for pregnancy of unknown anatomic location, bleeding in early pregnancy and abdominal pain in pregnancy. Patient states she is still having a lot of bleeding and cramping. Has follow up lab work scheduled for tomorrow 10-30-2018 at 0900 with [facility] for Healthcare. Patient states she was told they would call her with results. States precautions were reviewed with her in the ER and she is aware of reasons to need to return to ER. RN advised would review with Melvia Heaps, CNM and return call with any additional recommendations. Patient agreeable.   Routing to provider for review.

## 2018-10-29 NOTE — Telephone Encounter (Signed)
Patient is returning call to triage nurse. °

## 2018-10-29 NOTE — Telephone Encounter (Signed)
Agree with plan 

## 2018-10-29 NOTE — Telephone Encounter (Signed)
Encounter closed per Deborah Leonard, CNM.  

## 2018-10-30 ENCOUNTER — Other Ambulatory Visit: Payer: Self-pay

## 2018-10-30 ENCOUNTER — Telehealth: Payer: Self-pay | Admitting: Certified Nurse Midwife

## 2018-10-30 ENCOUNTER — Ambulatory Visit (INDEPENDENT_AMBULATORY_CARE_PROVIDER_SITE_OTHER): Payer: 59 | Admitting: *Deleted

## 2018-10-30 VITALS — BP 110/75 | HR 80 | Temp 98.3°F | Wt 116.0 lb

## 2018-10-30 DIAGNOSIS — O209 Hemorrhage in early pregnancy, unspecified: Secondary | ICD-10-CM

## 2018-10-30 DIAGNOSIS — O3680X0 Pregnancy with inconclusive fetal viability, not applicable or unspecified: Secondary | ICD-10-CM

## 2018-10-30 LAB — BETA HCG QUANT (REF LAB): hCG Quant: 22 m[IU]/mL

## 2018-10-30 LAB — GC/CHLAMYDIA PROBE AMP (~~LOC~~) NOT AT ARMC
Chlamydia: NEGATIVE
Neisseria Gonorrhea: NEGATIVE

## 2018-10-30 NOTE — Telephone Encounter (Signed)
Patient sent the following message through [place]. Routing to triage to assist patient with request. Previous encounter closed in error.  Courtney Barnett, [place] Clinical Pool  Phone Number: [phone]        Am I able to receive a doctors note excusing me from work while I rest and grieve during my believed miscarriage?

## 2018-10-30 NOTE — Telephone Encounter (Signed)
Routing to Melvia Heaps, CNM to review and advise on note.

## 2018-10-30 NOTE — Telephone Encounter (Signed)
Patient sent the following message through [place]. Routing to triage to assist patient with request.  Courtney Barnett, [place] Pool  Phone Number: [phone]        Am I able to receive a doctors note excusing me from work while I rest and grieve during my believed miscarriage?

## 2018-10-30 NOTE — Progress Notes (Signed)
Pt reports less bleeding than during visit to [facility]U on 9/13. She is still haivng some abdominal cramping which is less than previously. Pain scale - 4. Stat BHCG drawn and pt will be called with results and plan of care. Pt voiced understanding.   1205  BHCG results and chart reviewed by Kerry Hough, PA.  She advised repeat of Stat BHCG on 9/18. Pt was called and informed of results as well as plan of care recommendations. Pt advised that she may take ibuprofen or naproxen to relieve cramping if desired. She voiced understanding of all information given and agreed to return for appt on 9/18 @ 0830.

## 2018-10-30 NOTE — Progress Notes (Signed)
Patient seen and assessed by nursing staff during this encounter. I have reviewed the chart and agree with the documentation and plan.  Kerry Hough, PA-C 10/30/2018 1:58 PM

## 2018-10-31 ENCOUNTER — Telehealth: Payer: Self-pay | Admitting: General Practice

## 2018-10-31 ENCOUNTER — Encounter: Payer: Self-pay | Admitting: Obstetrics & Gynecology

## 2018-10-31 DIAGNOSIS — O3680X0 Pregnancy with inconclusive fetal viability, not applicable or unspecified: Secondary | ICD-10-CM

## 2018-10-31 NOTE — Telephone Encounter (Signed)
Spoke with patient, advised as seen below per Dr. Sabra Heck. Patient will need a signed letter, will pick up from office on 9/17. RN will contact patient to notify when letter is ready for pick up. OV scheduled for 9/18 t 2pm with Melvia Heaps, CNM. Patient verbalizes understanding and is agreeable.

## 2018-10-31 NOTE — Telephone Encounter (Signed)
Routing to Dr. Miller to review.  

## 2018-10-31 NOTE — Telephone Encounter (Signed)
Patient called and left message on nurse voicemail line stating she was here yesterday and was diagnosed with a miscarriage. She wants to know if she can get a doctor's note for the rest of the week. Per chart review, note is in progress from [facility]. Called patient & she states she is receiving a note already from them and does not need assistance. Patient had no questions.

## 2018-10-31 NOTE — Telephone Encounter (Signed)
Please make her an appt to see Debbi on Friday with lab--HCG.  Letter is written and pended.  It needs the date added for the start of her week out from work.  Ok to complete and fax to pt or her employer.  Thanks.

## 2018-10-31 NOTE — Telephone Encounter (Signed)
Spoke with patient. Patient states she has another BhcG on 9/18 with [facility], was seen for nurse visit on 10/30/18 for f/u and repeat BhcG. Patient reports abdominal cramping still present, bleeding continues to slow, changing panty liner q few hours. Denies any other symptoms. Patient is requesting a note for work during this time for grieving. Patient also request to return to [facility] for labs. Advised patient I will have to review with MD and return call with recommendations. Patient agreeable.   Message to Dr. Sabra Heck to review.

## 2018-10-31 NOTE — Telephone Encounter (Signed)
I think it is acceptable to give a note for one week for recovery time mental and physical.

## 2018-11-01 ENCOUNTER — Other Ambulatory Visit: Payer: Self-pay | Admitting: Obstetrics & Gynecology

## 2018-11-01 ENCOUNTER — Telehealth: Payer: Self-pay | Admitting: *Deleted

## 2018-11-01 NOTE — Telephone Encounter (Signed)
Spoke with patient. OV cancelled for 9/18 with Melvia Heaps, CNM. R/s with Dr. Sabra Heck at 11:45am. Patient will arrive early for labs. Patient verbalizes understanding and is agreeable.   Routing to provider for final review. Patient is agreeable to disposition. Will close encounter.

## 2018-11-01 NOTE — Telephone Encounter (Signed)
Spoke with patient. Advised signed letter has been placed at front office for pick up. Patient verbalizes understanding and is agreeable.   Routing to provider for final review. Patient is agreeable to disposition. Will close encounter.  Cc: Melvia Heaps, CNM

## 2018-11-02 ENCOUNTER — Other Ambulatory Visit: Payer: Self-pay

## 2018-11-02 ENCOUNTER — Telehealth: Payer: Self-pay | Admitting: Obstetrics & Gynecology

## 2018-11-02 ENCOUNTER — Other Ambulatory Visit: Payer: Self-pay | Admitting: *Deleted

## 2018-11-02 ENCOUNTER — Encounter: Payer: Self-pay | Admitting: Obstetrics & Gynecology

## 2018-11-02 ENCOUNTER — Ambulatory Visit: Payer: 59

## 2018-11-02 ENCOUNTER — Ambulatory Visit: Payer: 59 | Admitting: Obstetrics & Gynecology

## 2018-11-02 ENCOUNTER — Ambulatory Visit: Payer: Self-pay | Admitting: Certified Nurse Midwife

## 2018-11-02 VITALS — BP 100/70 | HR 88 | Temp 98.3°F | Ht 63.0 in | Wt 116.0 lb

## 2018-11-02 DIAGNOSIS — O039 Complete or unspecified spontaneous abortion without complication: Secondary | ICD-10-CM

## 2018-11-02 LAB — BETA HCG QUANT (REF LAB): hCG Quant: 12 m[IU]/mL

## 2018-11-02 NOTE — Progress Notes (Addendum)
GYNECOLOGY  VISIT  CC:   Follow   HPI: 28 y.o. G32P1001 Married White or Caucasian female here for HCG level recheck and for additional evaluation of pelvic cramping.  Pt was seen at [facility]U on 9/13 for bleeding in early pregnancy.  HCG was 34.  Blood type is O+.  Ultrasound showed no intrauterine finding.  She did have bilateral pelvic adnexal fluid.  Repeat HCG in 2 days was 22.  Pt reports she had bleeding like a heavy period on 9/13 that then lessened and stopped yesterday, 9/17.  She reports she's had some increased cramping, worse on Wednesday, that is still present today.  Ultrasound images reviewed.  HCG obtained again today.  Miscarriage vs ectopic pregnancy reviewed.  With type of bleeding she had and lowering HCG, ectopic is less likely reviewed typical lowering of HCG with miscarriage.  Pt has only taken a little Tylenol for pain and nothing today.  Denies lighteaded feeling or palpations.   GYNECOLOGIC HISTORY: Patient's last menstrual period was 09/22/2018. Contraception: none Menopausal hormone therapy: none  Patient Active Problem List   Diagnosis Date Noted  . Pregnancy of unknown anatomic location 10/28/2018  . Epigastric pain 06/12/2018  . GAD (generalized anxiety disorder) 03/19/2018  . Amenorrhea 03/19/2018  . Joint pain in fingers of left hand 12/04/2017  . 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 Acute bronchitis 06/16/2017  . Calculus of gallbladder without cholecystitis without obstruction   . Biliary colic 71/07/2692  . 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 Healthcare maintenance 04/07/2016    Past Medical History:  Diagnosis Date  . Anxiety   . Biliary colic 85/46/2703  . Vasovagal syncope     Past Surgical History:  Procedure Laterality Date  . BREAST SURGERY Left 03/2018   biopsy  . CHOLECYSTECTOMY  12/20/2016   Procedure: LAPAROSCOPIC CHOLECYSTECTOMY WITH INTRAOPERATIVE CHOLANGIOGRAM;  Surgeon: Vickie Epley, MD;  Location: [facility] ORS;  Service: General;;  .  INTRAUTERINE DEVICE (IUD) INSERTION  09/2013  . WISDOM TOOTH EXTRACTION      MEDS:   Current Outpatient Medications on File Prior to Visit  Medication Sig Dispense Refill  . Prenatal Vit-Fe Fumarate-FA (PRENATAL VITAMIN PO) Take by mouth.     No current facility-administered medications on file prior to visit.     ALLERGIES: Patient has no known allergies.  Family History  Problem Relation Age of Onset  . Diabetes Maternal Grandfather   . Colon cancer Neg Hx   . Stomach cancer Neg Hx   . Pancreatic cancer Neg Hx     SH:  Married, non smoker  Review of Systems  Genitourinary: Positive for vaginal bleeding.       Cramping   All other systems reviewed and are negative.   PHYSICAL EXAMINATION:    BP 100/70   Pulse 88   Temp 98.3 F (36.8 C) (Temporal)   Ht 5\' 3"  (1.6 m)   Wt 116 lb (52.6 kg)   LMP 09/22/2018   BMI 20.55 kg/m     General appearance: alert, cooperative and appears stated age Abdomen: soft, mild RLQ tenderness, no rebound or guarding; bowel sounds normal; no masses,  no organomegaly Lymph:  no inguinal LAD noted  Pelvic: External genitalia:  no lesions              Urethra:  normal appearing urethra with no masses, tenderness or lesions              Bartholins and Skenes: normal  Vagina: normal appearing vagina with normal color and discharge, no lesions              Cervix: no lesions              Bimanual Exam:  Uterus:  normal size, contour, position, consistency, mobility, non-tender              Adnexa: left left lower quadrant tenderness, not obviously adnexal on exam  Chaperone was present for exam.  Assessment: Likely miscarriage Pelvic cramping Free fluid on PUS in MAU on 10/28/2018  Plan: HCG obtained today.  This will need to be followed to normal value. Rhogam not indicated. Motrin 800mg  every 8 hours prn cramping recommended She is going to monitor cramping and call on Monday if persistent If HCG does not lower as  it should, will plan to repeat PUS Timing for next pregnancy discussed.  Current recommendations do not recommend waiting three fulls months to try and recommend starting to try again as soon as pt is ready.  Reviewed with pt.

## 2018-11-02 NOTE — Telephone Encounter (Signed)
Spoke with patient. Advised no diet restrictions, questions answered.   Routing to provider for final review. Patient is agreeable to disposition. Will close encounter.

## 2018-11-02 NOTE — Telephone Encounter (Signed)
Patient sent the following message through [place]. Routing to triage to assist patient with request.  Alfonse Ras, [place] Pool  Phone Number: [phone]        I forgot to ask if it is safe to have caffeine again. One of the restrictions I was told at the hospital was to avoid caffeine. Thank you.

## 2018-11-05 ENCOUNTER — Ambulatory Visit (HOSPITAL_COMMUNITY)
Admission: RE | Admit: 2018-11-05 | Discharge: 2018-11-05 | Disposition: A | Discharge status: Home / Self Care | Payer: 59 | Source: Ambulatory Visit | Attending: Obstetrics & Gynecology | Admitting: Obstetrics & Gynecology

## 2018-11-05 ENCOUNTER — Other Ambulatory Visit: Payer: Self-pay

## 2018-11-05 ENCOUNTER — Encounter: Payer: Self-pay | Admitting: Obstetrics & Gynecology

## 2018-11-05 ENCOUNTER — Telehealth: Payer: Self-pay | Admitting: Obstetrics & Gynecology

## 2018-11-05 DIAGNOSIS — O039 Complete or unspecified spontaneous abortion without complication: Secondary | ICD-10-CM

## 2018-11-05 DIAGNOSIS — R102 Pelvic and perineal pain: Secondary | ICD-10-CM | POA: Diagnosis not present

## 2018-11-05 NOTE — Telephone Encounter (Signed)
Spoke with patient. Seen in office on 9/18 for repeat BhcG. Patient reports pelvic cramping not improved. Reports pelvic cramping that shoots to groin and both lower extremities. Worse when walking.  8/10, not relieved by Ibuprofen 600 mg. Last dose taken at 7am.  Was advised to call if not improved or worsening.   Denies vaginal bleeding, N/V, fever/chills, SOB, redness or swelling in BLE.   Advised patient I will review with Dr. Sabra Heck and return call with recommendations, patient agreeable.   11/02/18 BhcG: 12, will repeat on 9/24.   Dr. Sabra Heck -please advise.

## 2018-11-05 NOTE — Telephone Encounter (Signed)
Can you see if we can get an ultrasound done today?  Thanks.

## 2018-11-05 NOTE — Telephone Encounter (Signed)
Visit Follow-Up Question Received: Today Message Contents  Courtney Barnett, [name] sent to [place]  Phone Number: [phone]        Dr. Sabra Heck stated to reach out if Im still having pain by today following my miscarriage. Im still experiencing pain and it increases with walking. Thank you.

## 2018-11-05 NOTE — Telephone Encounter (Signed)
Spoke with patient, advised per Dr. Sabra Heck, patient agreeable to proceed with PUS. Advised RN will call to schedule and return call.   Spoke with Ginger at [facility]. Order placed for PUS OB less than 14wks with transvaginal. Hold patient, call report to office. Scheduled at [facility] today at 2pm, arrive at 1:45pm. Arrive with full bladder.   Call to patient, advised of PUS appt and information. Patient verbalizes understanding and is agreeable.    Routing to provider for final review. Patient is agreeable to disposition. Will close encounter.

## 2018-11-06 ENCOUNTER — Other Ambulatory Visit (INDEPENDENT_AMBULATORY_CARE_PROVIDER_SITE_OTHER): Payer: 59

## 2018-11-06 ENCOUNTER — Telehealth: Payer: Self-pay | Admitting: *Deleted

## 2018-11-06 DIAGNOSIS — O039 Complete or unspecified spontaneous abortion without complication: Secondary | ICD-10-CM

## 2018-11-06 LAB — BETA HCG QUANT (REF LAB): hCG Quant: 5 m[IU]/mL

## 2018-11-06 NOTE — Telephone Encounter (Signed)
Reviewed with Dr. Sabra Heck, recommended patient return to office today for repeat BhcG.   Call placed to patient. Patient reports no change in pain, not worse. Denies any new symptoms. Pelvic pain 8/10, not resolved with ibuprofen. Denies N/V, fever/chills or vaginal bleeding. Lab appt scheduled for today at 1:15pm.   Future lab orders placed.

## 2018-11-06 NOTE — Telephone Encounter (Signed)
Spoke with patient, advised of BhcG results. OV scheduled for 9/25 at 4:30pm with Dr. Sabra Heck. Advised patient to call if symptoms worsen or new symptoms develop. ER precautions reviewed. Patient verbalizes understanding.   Routing to provider for final review. Patient is agreeable to disposition. Will close encounter.

## 2018-11-06 NOTE — Telephone Encounter (Signed)
Spoke with Charlena Cross at [employer]. [employer].   BhcG: 5  Routing to Dr. Sabra Heck

## 2018-11-07 ENCOUNTER — Telehealth: Payer: Self-pay | Admitting: Obstetrics & Gynecology

## 2018-11-07 ENCOUNTER — Encounter: Payer: Self-pay | Admitting: Obstetrics & Gynecology

## 2018-11-07 NOTE — Telephone Encounter (Signed)
Left message to call Kathryne Ramella, RN at [facility] [phone].   

## 2018-11-07 NOTE — Telephone Encounter (Signed)
Patient sent the following correspondence through [place].  My pain level has increased from yesterday. It almost has an infection like feel. I am scheduled for Friday appointment.

## 2018-11-07 NOTE — Telephone Encounter (Signed)
Patient is returning call to Jill. °

## 2018-11-07 NOTE — Telephone Encounter (Signed)
Call reviewed with Dr. Talbert Nan, call returned to patient. Instructed patient to go directly to ER for further evaluation. Call to provide update, will the determine if f/u in office is still needed for 9/25. Patient verbalizes understanding and is agreeable.   On call provider, Dr. Quincy Simmonds, notified.   Routing to provider for final review. Patient is agreeable to disposition. Will close encounter.  Cc: Dr. Sabra Heck

## 2018-11-07 NOTE — Telephone Encounter (Signed)
Spoke with patient. Patient reports worsening lower abdominal and pelvic pain, 8.5/10. States pain is shooting into groin and both lower extremities. Abdomen feels "bloated". Denies vaginal bleeding, urinary symptoms, SOB, weakness, N/V, fever/chills. Last BM was "normal" on 11/06/18. Pain is not improving with OTC ibuprofen. Patient had a repeat BhcG on 9/24, it was 5. Patient is scheduled for f/u with Dr. Sabra Heck on 9/25. Advised patient I will review with Dr. Talbert Nan and return call, patient agreeable.

## 2018-11-08 ENCOUNTER — Ambulatory Visit: Payer: 59 | Admitting: Obstetrics & Gynecology

## 2018-11-08 ENCOUNTER — Other Ambulatory Visit: Payer: Self-pay

## 2018-11-08 ENCOUNTER — Telehealth: Payer: Self-pay | Admitting: Adult Health

## 2018-11-08 ENCOUNTER — Telehealth: Payer: Self-pay | Admitting: *Deleted

## 2018-11-08 ENCOUNTER — Encounter: Payer: Self-pay | Admitting: Obstetrics & Gynecology

## 2018-11-08 ENCOUNTER — Other Ambulatory Visit: Payer: 59

## 2018-11-08 VITALS — BP 100/82 | HR 88 | Temp 98.3°F | Ht 63.0 in | Wt 118.2 lb

## 2018-11-08 DIAGNOSIS — R102 Pelvic and perineal pain: Secondary | ICD-10-CM | POA: Diagnosis not present

## 2018-11-08 DIAGNOSIS — N719 Inflammatory disease of uterus, unspecified: Secondary | ICD-10-CM | POA: Diagnosis not present

## 2018-11-08 LAB — CBC WITH DIFFERENTIAL/PLATELET
Basophils Absolute: 0 10*3/uL (ref 0.0–0.2)
Basos: 0 %
EOS (ABSOLUTE): 0.1 10*3/uL (ref 0.0–0.4)
Eos: 1 %
Hematocrit: 36.5 % (ref 34.0–46.6)
Hemoglobin: 12.4 g/dL (ref 11.1–15.9)
Lymphocytes Absolute: 1.5 10*3/uL (ref 0.7–3.1)
Lymphs: 22 %
MCH: 29.5 pg (ref 26.6–33.0)
MCHC: 34 g/dL (ref 31.5–35.7)
MCV: 87 fL (ref 79–97)
Monocytes Absolute: 0.4 10*3/uL (ref 0.1–0.9)
Monocytes: 6 %
Neutrophils Absolute: 4.9 10*3/uL (ref 1.4–7.0)
Neutrophils: 71 %
Platelets: 311 10*3/uL (ref 150–450)
RBC: 4.2 x10E6/uL (ref 3.77–5.28)
RDW: 12.8 % (ref 11.7–15.4)
WBC: 7 10*3/uL (ref 3.4–10.8)

## 2018-11-08 LAB — POCT URINALYSIS DIPSTICK
Bilirubin, UA: NEGATIVE
Blood, UA: NEGATIVE
Glucose, UA: NEGATIVE
Ketones, UA: NEGATIVE
Nitrite, UA: NEGATIVE
Protein, UA: NEGATIVE
Urobilinogen, UA: 0.2 E.U./dL
pH, UA: 7 (ref 5.0–8.0)

## 2018-11-08 LAB — BETA HCG QUANT (REF LAB): hCG Quant: 4 m[IU]/mL

## 2018-11-08 MED ORDER — AMOXICILLIN-POT CLAVULANATE 875-125 MG PO TABS: 1.0000 | ORAL_TABLET | Freq: Two times a day (BID) | ORAL | 0 refills | Status: DC

## 2018-11-08 NOTE — Telephone Encounter (Signed)
Please call pt and have her schedule an in office visit.  Charyl Bigger, CMA

## 2018-11-08 NOTE — Telephone Encounter (Signed)
Reviewed 11/07/18 telephone encounter with Dr. Sabra Heck, per review of Epic patient not seen in ER, OV today recommended.   Spoke with patient. Patient reports no change in pain, did not go to ER, "trying not to go if not necessary". Denies any new symptoms, Covid19 prescreen negative. OV scheduled for 2:15pm today, patient is aware she is being worked into schedule and is agreeable.   Routing to provider for final review. Patient is agreeable to disposition. Will close encounter.

## 2018-11-08 NOTE — Progress Notes (Signed)
GYNECOLOGY  VISIT  CC:   Pelvic pain f/u   HPI: 28 y.o. G49P1001 Married White or Caucasian female here for LLQ pain that has continued since her miscarriage last week.  She was seen in the MAU on  10/28/2018.  HCG was 34.  She was having bleeding like a cycle.  The bleeding continued for four more days and was like a menstrual cycle.  It stopped on 11/01/2018.  HCG level dropped to 22, then 12, and then 5.  Throughout all of this time, she's had persistent LLQ pain.  She's been having normal bowel movements, no fever, no nausea and no abnormal stools.  She is not bleeding.  She reports having a bowel movement every other day and she had a bowel movement this morning.  Denies dysuria.  She is having some urinary hesitancy.  Has not need seen blood in her urine.  Does not think it is cloudy.  Ultrasound on 10/28/2018 showed no gestational sac but there was moderate clear fluid in the pelvis.  Ultrasound repeated 11/05/2018 with no gestational sac, endometrium 35mm without definitive endometrial fluid noted, and moderate amount of simple appearing free fluid in the pelvis.  GYNECOLOGIC HISTORY: Patient's last menstrual period was 09/22/2018. Contraception: none Menopausal hormone therapy: none  Patient Active Problem List   Diagnosis Date Noted  . Pregnancy of unknown anatomic location 10/28/2018  . Epigastric pain 06/12/2018  . GAD (generalized anxiety disorder) 03/19/2018  . Amenorrhea 03/19/2018  . Joint pain in fingers of left hand 12/04/2017  . 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 Acute bronchitis 06/16/2017  . Calculus of gallbladder without cholecystitis without obstruction   . Biliary colic 12/13/2016  . 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 Healthcare maintenance 04/07/2016    Past Medical History:  Diagnosis Date  . Anxiety   . Biliary colic 12/13/2016  . Vasovagal syncope     Past Surgical History:  Procedure Laterality Date  . BREAST SURGERY Left 03/2018   biopsy  . CHOLECYSTECTOMY   12/20/2016   Procedure: LAPAROSCOPIC CHOLECYSTECTOMY WITH INTRAOPERATIVE CHOLANGIOGRAM;  Surgeon: Ancil Linsey, MD;  Location: [facility] ORS;  Service: General;;  . INTRAUTERINE DEVICE (IUD) INSERTION  09/2013  . WISDOM TOOTH EXTRACTION      MEDS:   Current Outpatient Medications on File Prior to Visit  Medication Sig Dispense Refill  . ibuprofen (ADVIL) 200 MG tablet Take 200 mg by mouth every 8 (eight) hours as needed.    . Prenatal Vit-Fe Fumarate-FA (PRENATAL VITAMIN PO) Take by mouth.     No current facility-administered medications on file prior to visit.     ALLERGIES: Patient has no known allergies.  Family History  Problem Relation Age of Onset  . Diabetes Maternal Grandfather   . Colon cancer Neg Hx   . Stomach cancer Neg Hx   . Pancreatic cancer Neg Hx     SH:  Married, non smoker  Review of Systems  All other systems reviewed and are negative.   PHYSICAL EXAMINATION:    BP 100/82   Pulse 88   Temp 98.3 F (36.8 C) (Temporal)   Ht 5\' 3"  (1.6 m)   Wt 118 lb 3.2 oz (53.6 kg)   LMP 09/22/2018   Breastfeeding Unknown   BMI 20.94 kg/m     General appearance: alert, cooperative and appears stated age CV:  Regular rate and rhythm Lungs:  clear to auscultation, no wheezes, rales or rhonchi, symmetric air entry Abdomen: soft, non-tender; bowel sounds normal; no masses,  no organomegaly Lymph:  no inguinal LAD noted  Pelvic: External genitalia:  no lesions              Urethra:  normal appearing urethra with no masses, tenderness or lesions              Bartholins and Skenes: normal                 Vagina: normal appearing vagina with normal color and discharge, no lesions              Cervix: no lesions , closed              Bimanual Exam:  Uterus:  Normal size and contour, mild uterine tenderness              Adnexa: no mass, fullness, tenderness              Anus:  normal sphincter tone, no lesions  Chaperone was present for exam.  Assessment: Possible  endometritis with 13 mm endometrium without definite fluid noted Moderate, simple free pelvic fluid Miscarriage last week  Plan: Repeat HCG again today just to be sure CBC obtained Will start Augmentin 875mg  bid x 14 days.   May need additional imaging if pain does not resolve

## 2018-11-08 NOTE — Telephone Encounter (Signed)
Patient called and left VM requesting to speak with nurse about some ongoing abd pain. Patient had a miscarriage a week ago and is still having abd pain, she was directed to [place] by OBGYN as they think the 2 are unrelated. Please advise patient and if we need to schedule an appt or any imaging.

## 2018-11-09 ENCOUNTER — Telehealth: Payer: Self-pay | Admitting: Obstetrics & Gynecology

## 2018-11-09 ENCOUNTER — Ambulatory Visit: Payer: Self-pay | Admitting: Obstetrics & Gynecology

## 2018-11-09 ENCOUNTER — Encounter: Payer: Self-pay | Admitting: Obstetrics & Gynecology

## 2018-11-09 DIAGNOSIS — R102 Pelvic and perineal pain: Secondary | ICD-10-CM

## 2018-11-09 LAB — URINE CULTURE

## 2018-11-09 NOTE — Telephone Encounter (Signed)
Left message to call Sharee Pimple, RN at [facility].    Call reviewed with Dr. Sabra Heck. Continue current abx, take with food. Ok to proceed with CT abdomen/pelvis with contrast.   Order placed for CT scan at [facility]. Dx: Pelvic and perineal pain

## 2018-11-09 NOTE — Telephone Encounter (Signed)
Call placed to [facility] main radiology scheduling, spoke with Elberta Fortis. Was advised first available CT scan at [facility] facility 11/19/18.   Call placed to [facility] [phone], on hold for 15 min, call disconnected.     Call placed to patient. Patient does not want change Abx RX at this time. Only experiencing some mild nausea, no vomiting. No Rx sent. Patient verbalizes understanding and is agreeable.   Routing to provider for final review. Patient is agreeable to disposition. Will close encounter.

## 2018-11-09 NOTE — Telephone Encounter (Signed)
Call reviewed with Dr. Sabra Heck. Call returned to patient.  Advised of normal CBC and BhcG of 4.  Patient reports she has taken 2 doses of abx, reports nausea, no vomiting. Taking with food. States she has taken amoxicillin in the past and "not tolerated well".  No change in pain. Denies fever/chills.  Patient would like to proceed with CT scan.  Advised patient I will review update Dr. Sabra Heck and return call to advise on Abx. Patient agreeable.   Dr. Sabra Heck -please review and advise on abx.

## 2018-11-09 NOTE — Addendum Note (Signed)
Addended by: Burnice Logan on: 11/09/2018 12:23 PM   Modules accepted: Orders

## 2018-11-09 NOTE — Telephone Encounter (Signed)
Patient seen in office on 9/24. Routing to Dr. Sabra Heck to advise.

## 2018-11-09 NOTE — Telephone Encounter (Signed)
Her antibiotics can be switched to amoxicillin 500mg  tid and flagyl 500mg  tid x 13 days (if she's taken two doses of the Augmentin).  Please sent these to pharmacy if she wasn't to change them.

## 2018-11-09 NOTE — Telephone Encounter (Signed)
Patient sent the following correspondence through [place].  Can I request a CT scan of my pelvis and abdomen?

## 2018-11-09 NOTE — Telephone Encounter (Signed)
Spoke with patient, advised per Dr. Sabra Heck. Patient request to proceed with CT scheduling. Advised RN will call to schedule and return call with appt information. Patient agreeable.    Call placed to [facility], spoke with Manus Gunning. Confirmed order for CT abdomen/pelvis with contrast. Scheduled for 10/1 at 8:30am, arrive at 8:20am, [address] Location. No solid food 4 hrs prior. Patient will need to pick up contrast.

## 2018-11-09 NOTE — Telephone Encounter (Addendum)
Spoke with Tammy at [facility] scheduling. CT scan appt available at [facility] on 11/12/18.   Tammy placed on hold, call to patient, no answer. Left message to call Sharee Pimple, RN at [facility].   Patient can contact [facility] scheduling directly at [phone].

## 2018-11-09 NOTE — Telephone Encounter (Signed)
Spoke with patient, advised of appt as seen below. Advised patient our office will need to precert CT prior to appt. Patient verbalizes understanding and is agreeable.   Routing to provider for final review. Patient is agreeable to disposition. Will close encounter.  Cc: Lerry Liner

## 2018-11-12 ENCOUNTER — Telehealth: Payer: Self-pay | Admitting: Obstetrics & Gynecology

## 2018-11-12 ENCOUNTER — Ambulatory Visit: Payer: 59 | Admitting: Obstetrics & Gynecology

## 2018-11-12 NOTE — Telephone Encounter (Signed)
CT authorization still pending.   Call placed to patient, left detailed message, ok per dpr. Advised authorization still pending, will notify of response once received.

## 2018-11-12 NOTE — Telephone Encounter (Signed)
Call placed to patient to advised authorization for CT scan is pending in review. Advised once approval is obtained, I will notify patient and work with clinical to reschedule CT scan. Patient is agreeable.    cc: Glorianne Manchester, RN

## 2018-11-12 NOTE — Telephone Encounter (Signed)
Spoke with Larene Beach at [facility]. CT cancelled for today, pending authorization. Will call to reschedule.

## 2018-11-12 NOTE — Telephone Encounter (Signed)
Spoke with patient, advised as seen below per Dr. Sabra Heck. Patient states no change in pelvic pain, no new symptoms. Tolerating abx. Patient is requesting to proceed with CT at [facility] if appt available. Advised patient I have placed a call to scheduling, awaiting return call. Advised imaging will also require precert. Patient is scheduled for OV today at 3pm with Dr. Sabra Heck, asking if OV still needed today?   Advised patient I will f/u with CT appt info once scheduled and review OV with Dr. Sabra Heck. Patient verbalizes understanding and is agreeable.

## 2018-11-12 NOTE — Telephone Encounter (Signed)
-----   Message from Joda Salon, MD sent at 11/11/2018  9:35 PM EDT ----- Please let pt know repeat HCG from Thursday was still in the normal range.  CBC was normal.  Urine culture was negative for bacteria.  She sent a mychart message about having the CT on Monday.  If she is still in pain, please see if can get scheduled on Monday.

## 2018-11-12 NOTE — Telephone Encounter (Signed)
Call placed to [place] at [facility]. Left message to call Sharee Pimple, RN at [place] to assist with CT scheduling. Courtney Barnett

## 2018-11-12 NOTE — Telephone Encounter (Signed)
See telephone encounter dated 11/12/18.

## 2018-11-12 NOTE — Telephone Encounter (Signed)
Reviewed call with Dr. Sabra Heck, provided update. If no change in patients symptoms, ok to cancel OV and proceed with CT.

## 2018-11-12 NOTE — Telephone Encounter (Signed)
Patient sent the following correspondence through [place].  Nurse Sharee Pimple left a voicemail regarding a CT scan at [facility] for Monday 9/28. If possible I would like to go forth with this option. Thank you!

## 2018-11-12 NOTE — Telephone Encounter (Signed)
Spoke with Larene Beach at [facility]. Patient scheduled for CT abdomen/pelvis with contrast today at 11:30am, arrive at 11:15am. [address], [place]. Fax order to [phone].   Call to patient to notify, agreeable to date and time. Advised our office will precert. I will f/u with Dr. Sabra Heck regarding OV today and return call. Patient verbalizes understanding and is agreeable.   Order faxed to [facility].   Routing to [employer].

## 2018-11-12 NOTE — Telephone Encounter (Signed)
Spoke with patient. OV cancelled for today, will wait for authorization for CT scan, patient is aware she will be notified. Patient verbalizes understanding and is agreeable.

## 2018-11-12 NOTE — Telephone Encounter (Signed)
Patient is returning call regarding eating today. Patient stated that she was holding off in case there was a chance to be seen today for CT scan.

## 2018-11-14 NOTE — Telephone Encounter (Signed)
Spoke with patient. Patient states she spoke with her insurance company,  CT of pelvis was denied. Advised patient I will have business office request copy of authorization request, our office will f/u. Patient will keep imaging as scheduled for now at [facility] until our office follows up.

## 2018-11-14 NOTE — Telephone Encounter (Signed)
Spoke with patient, notified of CT authorization approval. Advised patient to proceed with CT as scheduled for 10/1 at [facility]. Patient verbalizes understanding and is agreeable.   Routing to provider for final review. Patient is agreeable to disposition. Will close encounter.   Cc: Lerry Liner

## 2018-11-14 NOTE — Telephone Encounter (Signed)
Call placed to patient, left detailed message, ok per dpr. Advised as of this morning, CT authorization still pending review. We will notify you once response is received. Advised patient she may also want to reach out to her insurance provider. Return call to office if any additional questions.

## 2018-11-14 NOTE — Telephone Encounter (Signed)
Regarding CT Scan (39767) scheduled at [place] on 11/15/2018. After Peer to Peer review, Dr Sabra Heck has obtained approval. The authorization number is [id number] the approval is valid today through 12/29/2018. The approval was originally approved for [facility], but patient is now having scan at [place].  I called [employer] [phone], spoke with Ms Quentin Cornwall, and she has changed the location of the scan to [place]. The call reference number is the same as the authorization number.  I have also called [facility] and spoke with Prentiss Bells. She has noted the information and will forward to [employer]   MH:DQQI Joslyn Devon, RN

## 2018-11-15 ENCOUNTER — Ambulatory Visit
Admission: RE | Admit: 2018-11-15 | Discharge: 2018-11-15 | Disposition: A | Discharge status: Home / Self Care | Payer: 59 | Source: Ambulatory Visit | Attending: Obstetrics & Gynecology | Admitting: Obstetrics & Gynecology

## 2018-11-15 ENCOUNTER — Other Ambulatory Visit: Payer: Self-pay

## 2018-11-15 DIAGNOSIS — R102 Pelvic and perineal pain: Secondary | ICD-10-CM

## 2018-11-15 MED ORDER — IOPAMIDOL (ISOVUE-300) INJECTION 61%
100.0000 mL | Freq: Once | INTRAVENOUS | Status: AC | PRN
  Administered 2018-11-15: 100 mL via INTRAVENOUS

## 2018-11-27 NOTE — Telephone Encounter (Signed)
Okay to close this encounter? °

## 2018-11-27 NOTE — Telephone Encounter (Signed)
Ok to close encounter. 

## 2018-12-28 ENCOUNTER — Telehealth: Payer: Self-pay | Admitting: Obstetrics & Gynecology

## 2018-12-28 ENCOUNTER — Encounter: Payer: Self-pay | Admitting: Obstetrics & Gynecology

## 2018-12-28 NOTE — Telephone Encounter (Signed)
Patient sent the following correspondence through [place].    I recently started my period on 11/11 pm. Last night I had a blood clot the size of a quarter come out.  I later had a pea sized one.  I wasnt sure if this was normal. I did miscarry 2 months ago and wasnt sure if it could be related. Thank you.

## 2018-12-28 NOTE — Telephone Encounter (Signed)
Spoke with pt. Pt states starting LMP 12/26/18. Started having blood clots ranging from dime to quarter since last night on 11/12. Changing pads 3 times a day. Pt denies lightheadedness and weakness or heavy bleeding. Pt wants to know if from miscarriage in 10/2018. Had normal cycles since then, until the start of current cycle.  Doesn't have contraception method. Instructed pt to go to ER with heavy bleeding, soaking a pad an hour or less, having clots bigger than fist size over the weekend. Pt verbalized understanding. Pt scheduled OV on 01/01/19 at 3pm. Will call back to pt if any additional recommendations per Dr Sabra Heck. Pt mychart active.  Dr Sabra Heck please review and advise.

## 2018-12-30 NOTE — Telephone Encounter (Signed)
Agree with recommendations given.  OK to close encounter.

## 2019-01-01 ENCOUNTER — Other Ambulatory Visit: Payer: Self-pay

## 2019-01-01 ENCOUNTER — Ambulatory Visit: Payer: 59 | Admitting: Obstetrics & Gynecology

## 2019-01-01 ENCOUNTER — Encounter: Payer: Self-pay | Admitting: Obstetrics & Gynecology

## 2019-01-01 VITALS — BP 102/68 | HR 76 | Temp 97.3°F | Resp 12 | Ht 63.0 in | Wt 121.2 lb

## 2019-01-01 DIAGNOSIS — N926 Irregular menstruation, unspecified: Secondary | ICD-10-CM

## 2019-01-01 NOTE — Progress Notes (Signed)
GYNECOLOGY  VISIT  CC:  Patient states that she had a miscarriage 2 months ago. Per patient, had quarter-sized clots with LMP.   HPI: 28 y.o. G56P1001 Married White or Caucasian female here for complaint of heavier bleeding/clotting with last cycle.  Normally cycle lasts 7 days.  This most recent cycle last 5 days.  She noted increased bleeding/clots for about three days this months.  She typically changes pads about three times a day.  The flow wasn't that much heavier but the clotting was different.  She did have a normal cycle in October that was normal.    She wonders if this is related to her miscarriage in September.  Has not started trying again for pregnancy.  D/w pt causes of irregular bleeding.  PUS with miscarriage had 12mm endometrium.  CT did not show any endometrial abnormalities.  Polyp could be present.  Feel if has continued bleeding irregularities, should consider SHGM.  Pt is comfortable with this plan.  GYNECOLOGIC HISTORY: Patient's last menstrual period was 12/26/2018. Contraception: none Menopausal hormone therapy: n/a  Patient Active Problem List   Diagnosis Date Noted  . Pregnancy of unknown anatomic location 10/28/2018  . Epigastric pain 06/12/2018  . GAD (generalized anxiety disorder) 03/19/2018  . Amenorrhea 03/19/2018  . Joint pain in fingers of left hand 12/04/2017  . 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 Acute bronchitis 06/16/2017  . Calculus of gallbladder without cholecystitis without obstruction   . Biliary colic 02/58/5277  . 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 Healthcare maintenance 04/07/2016    Past Medical History:  Diagnosis Date  . Anxiety   . Biliary colic 82/42/3536  . Vasovagal syncope     Past Surgical History:  Procedure Laterality Date  . BREAST SURGERY Left 03/2018   biopsy  . CHOLECYSTECTOMY  12/20/2016   Procedure: LAPAROSCOPIC CHOLECYSTECTOMY WITH INTRAOPERATIVE CHOLANGIOGRAM;  Surgeon: Vickie Epley, MD;  Location: [facility] ORS;   Service: General;;  . INTRAUTERINE DEVICE (IUD) INSERTION  09/2013  . WISDOM TOOTH EXTRACTION      MEDS:   No current outpatient medications on file prior to visit.   No current facility-administered medications on file prior to visit.     ALLERGIES: Patient has no known allergies.  Family History  Problem Relation Age of Onset  . Diabetes Maternal Grandfather   . Colon cancer Neg Hx   . Stomach cancer Neg Hx   . Pancreatic cancer Neg Hx     SH:  Married, non smoker  Review of Systems  All other systems reviewed and are negative.   PHYSICAL EXAMINATION:    BP 102/68 (BP Location: Right Arm, Patient Position: Sitting, Cuff Size: Normal)   Pulse 76   Temp (!) 97.3 F (36.3 C) (Temporal)   Resp 12   Ht 5\' 3"  (1.6 m)   Wt 121 lb 3.2 oz (55 kg)   LMP 12/26/2018   BMI 21.47 kg/m     General appearance: alert, cooperative and appears stated age No physical exam  Chaperone was present for exam.  Assessment: Irregular cycle this past month Questions whether this is related to miscarriage  Plan: Consider SHGM if has additional irregular cycle.  She is going to call and give update about cycles if there is any future irregular bleeding.   ~15 minutes spent with patient >50% of time was in face to face discussion of above.

## 2019-01-29 ENCOUNTER — Telehealth: Payer: Self-pay

## 2019-01-29 ENCOUNTER — Encounter: Payer: Self-pay | Admitting: Obstetrics & Gynecology

## 2019-01-29 DIAGNOSIS — N926 Irregular menstruation, unspecified: Secondary | ICD-10-CM

## 2019-01-29 NOTE — Telephone Encounter (Signed)
Pt sent following mychart message.     Good Morning, Dr Sabra Heck told me to reach out if I had another abnormal period. I started on 12/12 in the afternoon. I have had many blood clots again. The largest was the morning of 12/13 and was about the size of two quarters.   Spoke to pt. Pt states starting cycle on 01/26/19 that normally will last 1 week. Had quarter size blood clots x 2 on Sunday and now very small clots with "heavier" bleeding. Pt denies lightheadedness, weakness and pain and soaking pads. Reviewed OV note from 1117/20. States for possible SHGM? Pt wanting to know next steps.   Will route to Dr Sabra Heck for recommendations. Please advise.

## 2019-01-31 NOTE — Telephone Encounter (Signed)
Patient following up on previous message

## 2019-01-31 NOTE — Telephone Encounter (Signed)
Spoke with pt. Pt states ok with SHGM. Pt started Dec cycle on 01/26/19 and will be out of window for 10 days of cycle this month. Instructed pt to call back to office in Jan with start of next cycle to schedule SHGM. Pt aware and agreeable.   Orders placed for [facility] and pt aware of call for benefits.   Will route to Dr Sabra Heck for any additional recommendations.

## 2019-01-31 NOTE — Telephone Encounter (Signed)
Left message for pt to call back to Triage Colletta Maryland, RN

## 2019-01-31 NOTE — Telephone Encounter (Signed)
It is fine to proceed with [facility] as regular ultrasound and CT scan has been normal.  Will need to time with cycle and within 10 days.

## 2019-02-15 NOTE — L&D Delivery Note (Signed)
Delivery Note Pt pushed well x 30 minutes. At 9:13 AM a viable and healthy female was delivered via Vaginal, Spontaneous (Presentation: Right Occiput Anterior).  APGAR: 9, 9; weight 8 lb 5.3 oz (3779 g). Moderate MSF noted at delivery. (Not seen prior) Baby placed skin-to-skin w/ mom. Spontaneous cry, bulb suctioned. Delayed cord clamping x 2 minutes. Cord clamped x 2 and cut by FOB.    Placenta status: Spontaneous, Intact.  Cord: 3 vessels with the following complications: None.  Cord pH: NA  Anesthesia: Epidural Episiotomy: None Lacerations: Labial, hemostatic. Repair not indicated.  Suture Repair: NA Est. Blood Loss (mL): 50  Mom to postpartum.  Baby to Couplet care / Skin to Skin.  Dorathy Kinsman 02/02/2020, 10:57 AM

## 2019-03-04 ENCOUNTER — Telehealth: Payer: Self-pay | Admitting: Obstetrics & Gynecology

## 2019-03-04 NOTE — Telephone Encounter (Signed)
Spoke to Courtney Barnett. Courtney Barnett wanting to scheduled [facility]. Courtney Barnett started cycle on 02/28/2019. Courtney Barnett is available all week. Will discuss with Dr Hyacinth Meeker and will let know of availability of appts this week. Courtney Barnett agreeable.

## 2019-03-04 NOTE — Telephone Encounter (Signed)
Patient started her period Thursday and calling to schedule procedure this week.

## 2019-03-04 NOTE — Telephone Encounter (Signed)
Spoke back with pt. Pt scheduled for [facility] on Day 8 of cycle 03/07/2019 at 3:30pm. Pt instructed to take Motrin 800 mg with food and water one hour before procedure. Pt agreeable and verbalized understanding. Pt aware of call for benefits re: SHGM.  Will route to Dr Hyacinth Meeker for review and will close encounter.  Orders placed.

## 2019-03-05 ENCOUNTER — Other Ambulatory Visit: Payer: Self-pay

## 2019-03-07 ENCOUNTER — Ambulatory Visit (INDEPENDENT_AMBULATORY_CARE_PROVIDER_SITE_OTHER): Payer: 59

## 2019-03-07 ENCOUNTER — Telehealth: Payer: Self-pay | Admitting: *Deleted

## 2019-03-07 ENCOUNTER — Other Ambulatory Visit: Payer: Self-pay

## 2019-03-07 ENCOUNTER — Other Ambulatory Visit: Payer: Self-pay | Admitting: Obstetrics & Gynecology

## 2019-03-07 ENCOUNTER — Ambulatory Visit: Payer: 59 | Admitting: Obstetrics & Gynecology

## 2019-03-07 DIAGNOSIS — N926 Irregular menstruation, unspecified: Secondary | ICD-10-CM

## 2019-03-07 DIAGNOSIS — N939 Abnormal uterine and vaginal bleeding, unspecified: Secondary | ICD-10-CM

## 2019-03-07 DIAGNOSIS — N920 Excessive and frequent menstruation with regular cycle: Secondary | ICD-10-CM

## 2019-03-07 DIAGNOSIS — Z8759 Personal history of other complications of pregnancy, childbirth and the puerperium: Secondary | ICD-10-CM

## 2019-03-07 NOTE — Progress Notes (Signed)
29 y.o. Married Caucasian female here for a pelvic ultrasound with sonohystogram due to increased flow and clotting since miscarriage in September.  Pt and spouse have not started trying again for pregnancy but are considering this after ultrasound today.  She would like a reason for why her cycles have changed.  CT obtained 11/15/2018 (due to pain) did not show any abnormalities.  .  Patient's last menstrual period was 09/22/2018.  Contraception:  none  Technique:  Both transabdominal and transvaginal ultrasound examinations of the pelvis were performed. Transabdominal technique was performed for global imaging of the pelvis including uterus, ovaries, adnexal regions, and pelvic cul-de-sac.  It was necessary to proceed with endovaginal exam following the abdominal ultrasound transabdominal exam to visualize the endometrium and adnexa.  Color and duplex Doppler ultrasound was utilized to evaluate blood flow to the ovaries.   FINDINGS: Uterus: 9.6 x 5.9 x 6.5cm Endometrium: 6.63mm, trilaminar Adnexa:  Left: 2.9 x 1.7 x 1.8cm     Right:  4.0 x 2.0 x 2.3cm with 1.4 x 1.0cm follicle, mild free fluid seen around both ovaries Cul de sac: no free fluid noted  SHSG:  After obtaining appropriate verbal consent from patient, the cervix was visualized using a speculum, and prepped with betadine.  A tenaculum  was not applied to the cervix.  Dilation of the cervix was not necessary. The catheter was passed into the uterus and sterile saline introduced, with the following findings: no intracavitary lesions noted  Assessment: Increased menstrual flow and clotting after SAB in September  Plan:   As imaging is normal, I do not have any new recommendations for her.  Feel it is safe to try.  I do not see any cause of miscarriage with her ultrasound.  D/w ovulation induction testing.  Instructions provided.  She is going to restart back her PNV and she thinks they will start trying again.  Questions answered.    ~20 minutes spent with patient >50% of time was in face to face discussion of above.

## 2019-03-07 NOTE — Telephone Encounter (Signed)
Return to work letter provided to patient while in office.   Encounter closed.

## 2019-03-08 ENCOUNTER — Encounter: Payer: Self-pay | Admitting: Obstetrics & Gynecology

## 2019-04-29 ENCOUNTER — Encounter: Payer: Self-pay | Admitting: Certified Nurse Midwife

## 2019-05-01 ENCOUNTER — Encounter: Payer: Self-pay | Admitting: Certified Nurse Midwife

## 2019-05-23 ENCOUNTER — Encounter: Payer: Self-pay | Admitting: Obstetrics & Gynecology

## 2019-05-23 ENCOUNTER — Telehealth: Payer: Self-pay | Admitting: *Deleted

## 2019-05-23 NOTE — Telephone Encounter (Signed)
Courtney Barnett, [name]  [facility] Clinical Pool  Phone Number: [phone]  Good Morning! I took a positive pregnancy test this morning! Is the next step to schedule an appointment for bloodwork to confirm?

## 2019-05-23 NOTE — Telephone Encounter (Signed)
Spoke with patient. LMP 04/26/19. Positive UPT on 05/23/19. Patient reports intermittent bilateral pelvic "menses like cramps" on 05/22/19, this has resolved, denies any pain or vaginal bleeding. Denies N/V, fever/chills.   OV scheduled for 4/9 at 10:30am with Dr. Hyacinth Meeker.  Covid 19 prescreen negative, precautions reviewed.  Hx of miscarriage, ectopic precautions reviewed.  Advised patient I will update Dr. Hyacinth Meeker and our office will call if any additional recommendations. Patient verbalizes understanding and is agreeable.   Routing to provider for final review. Patient is agreeable to disposition. Will close encounter.

## 2019-05-23 NOTE — Telephone Encounter (Signed)
See telephone encounter dated 05/23/19.   Encounter closed.  

## 2019-05-23 NOTE — Progress Notes (Signed)
GYNECOLOGY  VISIT  CC:   Pregnancy confirmation  HPI: 29 y.o. G66P1001 Married White or Caucasian female here for pregnancy confirmation.  She took two home UPTs and these are positive.  She is not having any bleeding.  She is having some cramping.  She is having breast tenderness.  She is having a little nausea at this point.  Denies emesis.  H/o miscarriage with last pregnancy.  Normal delivery with first pregnancy.  MBT O+.  LMP 06/13/2019.  EGA 4 0/7 weeks.  D/w pt proceeding with ultrasound around if everything proceeds normally these next weeks.  06/13/2019.   No cats in the home.  Tdap 2015.  Had chicken pox.  Aware flu vaccine safe and recommended.  Questions about Covid vaccination discussed including current AGOC and CDC guidelines, current evidence regarding safety, current evidence showing passive immunity to babies in mothers who received vaccination, and current studies for vaccination in children starting at 6 months.  All questions answered.    Common questions about foods in pregnancy, caffeine, alcohol addressed.  GYNECOLOGIC HISTORY: Patient's last menstrual period was 04/26/2019 (exact date). Contraception: none Menopausal hormone therapy: none UPT+  Patient Active Problem List   Diagnosis Date Noted  . GAD (generalized anxiety disorder) 03/19/2018  . 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 Calculus of gallbladder without cholecystitis without obstruction   . Biliary colic 12/13/2016  . 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 Healthcare maintenance 04/07/2016    Past Medical History:  Diagnosis Date  . Anxiety   . Biliary colic 12/13/2016  . Vasovagal syncope     Past Surgical History:  Procedure Laterality Date  . BREAST SURGERY Left 03/2018   biopsy  . CHOLECYSTECTOMY  12/20/2016   Procedure: LAPAROSCOPIC CHOLECYSTECTOMY WITH INTRAOPERATIVE CHOLANGIOGRAM;  Surgeon: Ancil Linsey, MD;  Location: [facility] ORS;  Service: General;;  . INTRAUTERINE DEVICE (IUD) INSERTION   09/2013  . WISDOM TOOTH EXTRACTION      MEDS:   Current Outpatient Medications on File Prior to Visit  Medication Sig Dispense Refill  . Prenatal Vit-Fe Fumarate-FA (PRENATAL VITAMIN PO) Take by mouth.     No current facility-administered medications on file prior to visit.    ALLERGIES: Patient has no known allergies.  Family History  Problem Relation Age of Onset  . Diabetes Maternal Grandfather   . Colon cancer Neg Hx   . Stomach cancer Neg Hx   . Pancreatic cancer Neg Hx     SH:  married, non smoker  Review of Systems  Constitutional: Negative.   HENT: Negative.   Eyes: Negative.   Respiratory: Negative.   Cardiovascular: Negative.   Gastrointestinal: Negative.   Endocrine: Negative.   Genitourinary: Negative.   Musculoskeletal: Negative.   Skin: Negative.   Allergic/Immunologic: Negative.   Neurological: Negative.   Psychiatric/Behavioral: Negative.     PHYSICAL EXAMINATION:    BP 112/70   Pulse 68   Temp 98.2 F (36.8 C) (Skin)   Resp 16   Wt 121 lb (54.9 kg)   LMP 04/26/2019 (Exact Date)   BMI 21.43 kg/m     General appearance: alert, cooperative and appears stated age No physical exam performed today.  Assessment: Early pregnacy with hx of miscarriage with prior pregnancy  Plan: Stat HCG will be obtained today and Monday.  If going up appropriately, will plan to proceed with ultrasound around 7 weeks.  Pt comfortable with plan.   25 minutes total time spent with pt in face to face discussion.

## 2019-05-24 ENCOUNTER — Ambulatory Visit: Payer: 59 | Admitting: Obstetrics & Gynecology

## 2019-05-24 ENCOUNTER — Encounter: Payer: Self-pay | Admitting: Obstetrics & Gynecology

## 2019-05-24 ENCOUNTER — Telehealth: Payer: Self-pay | Admitting: *Deleted

## 2019-05-24 ENCOUNTER — Other Ambulatory Visit: Payer: Self-pay | Admitting: Obstetrics & Gynecology

## 2019-05-24 ENCOUNTER — Other Ambulatory Visit: Payer: Self-pay

## 2019-05-24 VITALS — BP 112/70 | HR 68 | Temp 98.2°F | Resp 16 | Wt 121.0 lb

## 2019-05-24 DIAGNOSIS — Z8759 Personal history of other complications of pregnancy, childbirth and the puerperium: Secondary | ICD-10-CM | POA: Diagnosis not present

## 2019-05-24 DIAGNOSIS — N912 Amenorrhea, unspecified: Secondary | ICD-10-CM

## 2019-05-24 LAB — POCT URINE PREGNANCY: Preg Test, Ur: POSITIVE — AB

## 2019-05-24 LAB — BETA HCG QUANT (REF LAB): hCG Quant: 461 m[IU]/mL

## 2019-05-24 NOTE — Telephone Encounter (Signed)
STAT Beta Hcg 461.   Reviewed with Dr. Hyacinth Meeker, repeat beta hcg on 05/27/19, patient is scheduled.   Call to patient, advised as seen above. Patient verbalizes understanding, will keep lab appt as scheduled for 05/27/19 at 1:15pm.   Routing to provider for final review. Patient is agreeable to disposition. Will close encounter.

## 2019-05-27 ENCOUNTER — Other Ambulatory Visit (INDEPENDENT_AMBULATORY_CARE_PROVIDER_SITE_OTHER): Payer: 59

## 2019-05-27 ENCOUNTER — Telehealth: Payer: Self-pay

## 2019-05-27 ENCOUNTER — Other Ambulatory Visit: Payer: Self-pay

## 2019-05-27 ENCOUNTER — Other Ambulatory Visit: Payer: 59

## 2019-05-27 DIAGNOSIS — N912 Amenorrhea, unspecified: Secondary | ICD-10-CM

## 2019-05-27 DIAGNOSIS — Z8759 Personal history of other complications of pregnancy, childbirth and the puerperium: Secondary | ICD-10-CM

## 2019-05-27 LAB — BETA HCG QUANT (REF LAB): hCG Quant: 1776 m[IU]/mL

## 2019-05-27 NOTE — Telephone Encounter (Signed)
Please let her know this is going up normally.  Should proceed with viability PUS in about two weeks around 06/13/2019.  Ok to schedule.  Thanks.

## 2019-05-27 NOTE — Telephone Encounter (Signed)
Spoke with pt. Pt given lab results and recommendations  per Dr Hyacinth Meeker. Pt scheduled for PUS viability on 06/13/19 at 1 pm. Pt agreeable to date and time. Pt aware of call for benefits.   Routing to Dr Hyacinth Meeker for review.  Encounter closed.  Cc: Hedda Slade for precert. Orders placed.

## 2019-05-27 NOTE — Telephone Encounter (Signed)
Received phone call for Stat lab beta HCG.   Beta HcG 1,776.   Will review with Dr Hyacinth Meeker and get next steps in plan of care.   Routing to Dr Hyacinth Meeker

## 2019-06-01 ENCOUNTER — Inpatient Hospital Stay (HOSPITAL_COMMUNITY): Payer: 59

## 2019-06-01 ENCOUNTER — Other Ambulatory Visit: Payer: Self-pay

## 2019-06-01 ENCOUNTER — Encounter (HOSPITAL_COMMUNITY): Payer: Self-pay | Admitting: Obstetrics & Gynecology

## 2019-06-01 ENCOUNTER — Inpatient Hospital Stay (HOSPITAL_COMMUNITY)
Admission: AD | Admit: 2019-06-01 | Discharge: 2019-06-01 | Disposition: A | Discharge status: Home / Self Care | Payer: 59 | Attending: Obstetrics & Gynecology | Admitting: Obstetrics & Gynecology

## 2019-06-01 DIAGNOSIS — O209 Hemorrhage in early pregnancy, unspecified: Secondary | ICD-10-CM | POA: Diagnosis present

## 2019-06-01 DIAGNOSIS — O26891 Other specified pregnancy related conditions, first trimester: Secondary | ICD-10-CM | POA: Insufficient documentation

## 2019-06-01 DIAGNOSIS — Z6790 Unspecified blood type, Rh positive: Secondary | ICD-10-CM

## 2019-06-01 DIAGNOSIS — Z3A01 Less than 8 weeks gestation of pregnancy: Secondary | ICD-10-CM

## 2019-06-01 DIAGNOSIS — Z3490 Encounter for supervision of normal pregnancy, unspecified, unspecified trimester: Secondary | ICD-10-CM

## 2019-06-01 DIAGNOSIS — O4690 Antepartum hemorrhage, unspecified, unspecified trimester: Secondary | ICD-10-CM | POA: Diagnosis not present

## 2019-06-01 DIAGNOSIS — R102 Pelvic and perineal pain: Secondary | ICD-10-CM | POA: Insufficient documentation

## 2019-06-01 LAB — URINALYSIS, ROUTINE W REFLEX MICROSCOPIC
Bilirubin Urine: NEGATIVE
Glucose, UA: NEGATIVE mg/dL
Ketones, ur: NEGATIVE mg/dL
Leukocytes,Ua: NEGATIVE
Nitrite: NEGATIVE
Protein, ur: NEGATIVE mg/dL
Specific Gravity, Urine: 1.006 (ref 1.005–1.030)
pH: 6 (ref 5.0–8.0)

## 2019-06-01 LAB — WET PREP, GENITAL
Clue Cells Wet Prep HPF POC: NONE SEEN
Sperm: NONE SEEN
Trich, Wet Prep: NONE SEEN
Yeast Wet Prep HPF POC: NONE SEEN

## 2019-06-01 LAB — COMPREHENSIVE METABOLIC PANEL
ALT: 18 U/L (ref 0–44)
AST: 18 U/L (ref 15–41)
Albumin: 4.3 g/dL (ref 3.5–5.0)
Alkaline Phosphatase: 45 U/L (ref 38–126)
Anion gap: 11 (ref 5–15)
BUN: 10 mg/dL (ref 6–20)
CO2: 24 mmol/L (ref 22–32)
Calcium: 9.6 mg/dL (ref 8.9–10.3)
Chloride: 105 mmol/L (ref 98–111)
Creatinine, Ser: 0.74 mg/dL (ref 0.44–1.00)
GFR calc Af Amer: >60 mL/min (ref >60)
GFR calc non Af Amer: >60 mL/min (ref >60)
Glucose, Bld: 98 mg/dL (ref 70–99)
Potassium: 3.9 mmol/L (ref 3.5–5.1)
Sodium: 140 mmol/L (ref 135–145)
Total Bilirubin: 1.8 mg/dL — ABNORMAL HIGH (ref 0.3–1.2)
Total Protein: 7 g/dL (ref 6.5–8.1)

## 2019-06-01 LAB — CBC
HCT: 38.1 % (ref 36.0–46.0)
Hemoglobin: 12.7 g/dL (ref 12.0–15.0)
MCH: 29.4 pg (ref 26.0–34.0)
MCHC: 33.3 g/dL (ref 30.0–36.0)
MCV: 88.2 fL (ref 80.0–100.0)
Platelets: 334 10*3/uL (ref 150–400)
RBC: 4.32 MIL/uL (ref 3.87–5.11)
RDW: 12 % (ref 11.5–15.5)
WBC: 8.9 10*3/uL (ref 4.0–10.5)
nRBC: 0 % (ref 0.0–0.2)

## 2019-06-01 LAB — HCG, QUANTITATIVE, PREGNANCY: hCG, Beta Chain, Quant, S: 11950 m[IU]/mL — ABNORMAL HIGH (ref <5)

## 2019-06-01 NOTE — Discharge Instructions (Signed)
Abdominal Pain During Pregnancy  Abdominal pain is common during pregnancy, and has many possible causes. Some causes are more serious than others, and sometimes the cause is not known. Abdominal pain can be a sign that labor is starting. It can also be caused by normal growth and stretching of muscles and ligaments during pregnancy. Always tell your health care provider if you have any abdominal pain. Follow these instructions at home:  Do not have sex or put anything in your vagina until your pain goes away completely.  Get plenty of rest until your pain improves.  Drink enough fluid to keep your urine pale yellow.  Take over-the-counter and prescription medicines only as told by your health care provider.  Keep all follow-up visits as told by your health care provider. This is important. Contact a health care provider if:  Your pain continues or gets worse after resting.  You have lower abdominal pain that: ? Comes and goes at regular intervals. ? Spreads to your back. ? Is similar to menstrual cramps.  You have pain or burning when you urinate. Get help right away if:  You have a fever or chills.  You have vaginal bleeding.  You are leaking fluid from your vagina.  You are passing tissue from your vagina.  You have vomiting or diarrhea that lasts for more than 24 hours.  Your baby is moving less than usual.  You feel very weak or faint.  You have shortness of breath.  You develop severe pain in your upper abdomen. Summary  Abdominal pain is common during pregnancy, and has many possible causes.  If you experience abdominal pain during pregnancy, tell your health care provider right away.  Follow your health care provider's home care instructions and keep all follow-up visits as directed. This information is not intended to replace advice given to you by your health care provider. Make sure you discuss any questions you have with your health care  provider. Document Revised: 05/21/2018 Document Reviewed: 05/05/2016 Elsevier Patient Education  2020 [employer]     Vaginal Bleeding During Pregnancy, First Trimester  A small amount of bleeding from the vagina (spotting) is relatively common during early pregnancy. It usually stops on its own. Various things may cause bleeding or spotting during early pregnancy. Some bleeding may be related to the pregnancy, and some may not. In many cases, the bleeding is normal and is not a problem. However, bleeding can also be a sign of something serious. Be sure to tell your health care provider about any vaginal bleeding right away. Some possible causes of vaginal bleeding during the first trimester include:  Infection or inflammation of the cervix.  Growths (polyps) on the cervix.  Miscarriage or threatened miscarriage.  Pregnancy tissue developing outside of the uterus (ectopic pregnancy).  A mass of tissue developing in the uterus due to an egg being fertilized incorrectly (molar pregnancy). Follow these instructions at home: Activity  Follow instructions from your health care provider about limiting your activity. Ask what activities are safe for you.  If needed, make plans for someone to help with your regular activities.  Do not have sex or orgasms until your health care provider says that this is safe. General instructions  Take over-the-counter and prescription medicines only as told by your health care provider.  Pay attention to any changes in your symptoms.  Do not use tampons or douche.  Write down how many pads you use each day, how often you change pads, and  how soaked (saturated) they are.  If you pass any tissue from your vagina, save the tissue so you can show it to your health care provider.  Keep all follow-up visits as told by your health care provider. This is important. Contact a health care provider if:  You have vaginal bleeding during any part of your  pregnancy.  You have cramps or labor pains.  You have a fever. Get help right away if:  You have severe cramps in your back or abdomen.  You pass large clots or a large amount of tissue from your vagina.  Your bleeding increases.  You feel light-headed or weak, or you faint.  You have chills.  You are leaking fluid or have a gush of fluid from your vagina. Summary  A small amount of bleeding (spotting) from the vagina is relatively common during early pregnancy.  Various things may cause bleeding or spotting in early pregnancy.  Be sure to tell your health care provider about any vaginal bleeding right away. This information is not intended to replace advice given to you by your health care provider. Make sure you discuss any questions you have with your health care provider. Document Revised: 05/22/2018 Document Reviewed: 05/05/2016 Elsevier Patient Education  2020 [employer] Inc.     Subchorionic Hematoma  A subchorionic hematoma is a gathering of blood between the outer wall of the embryo (chorion) and the inner wall of the womb (uterus). This condition can cause vaginal bleeding. If they cause little or no vaginal bleeding, early small hematomas usually shrink on their own and do not affect your baby or pregnancy. When bleeding starts later in pregnancy, or if the hematoma is larger or occurs in older pregnant women, the condition may be more serious. Larger hematomas may get bigger, which increases the chances of miscarriage. This condition also increases the risk of:  Premature separation of the placenta from the uterus.  Premature (preterm) labor.  Stillbirth. What are the causes? The exact cause of this condition is not known. It occurs when blood is trapped between the placenta and the uterine wall because the placenta has separated from the original site of implantation. What increases the risk? You are more likely to develop this condition if:  You were  treated with fertility medicines.  You conceived through in vitro fertilization (IVF). What are the signs or symptoms? Symptoms of this condition include:  Vaginal spotting or bleeding.  Contractions of the uterus. These cause abdominal pain. Sometimes you may have no symptoms and the bleeding may only be seen when ultrasound images are taken (transvaginal ultrasound). How is this diagnosed? This condition is diagnosed based on a physical exam. This includes a pelvic exam. You may also have other tests, including:  Blood tests.  Urine tests.  Ultrasound of the abdomen. How is this treated? Treatment for this condition can vary. Treatment may include:  Watchful waiting. You will be monitored closely for any changes in bleeding. During this stage: ? The hematoma may be reabsorbed by the body. ? The hematoma may separate the fluid-filled space containing the embryo (gestational sac) from the wall of the womb (endometrium).  Medicines.  Activity restriction. This may be needed until the bleeding stops. Follow these instructions at home:  Stay on bed rest if told to do so by your health care provider.  Do not lift anything that is heavier than 10 lbs. (4.5 kg) or as told by your health care provider.  Do not use any products  that contain nicotine or tobacco, such as cigarettes and e-cigarettes. If you need help quitting, ask your health care provider.  Track and write down the number of pads you use each day and how soaked (saturated) they are.  Do not use tampons.  Keep all follow-up visits as told by your health care provider. This is important. Your health care provider may ask you to have follow-up blood tests or ultrasound tests or both. Contact a health care provider if:  You have any vaginal bleeding.  You have a fever. Get help right away if:  You have severe cramps in your stomach, back, abdomen, or pelvis.  You pass large clots or tissue. Save any tissue for  your health care provider to look at.  You have more vaginal bleeding, and you faint or become lightheaded or weak. Summary  A subchorionic hematoma is a gathering of blood between the outer wall of the placenta and the uterus.  This condition can cause vaginal bleeding.  Sometimes you may have no symptoms and the bleeding may only be seen when ultrasound images are taken.  Treatment may include watchful waiting, medicines, or activity restriction. This information is not intended to replace advice given to you by your health care provider. Make sure you discuss any questions you have with your health care provider. Document Revised: 01/13/2017 Document Reviewed: 03/29/2016 Elsevier Patient Education  2020 [employer].

## 2019-06-01 NOTE — MAU Provider Note (Signed)
History     CSN: [id number]  Arrival date and time: 06/01/19 1337   First Provider Initiated Contact with Patient 06/01/19 1533      Chief Complaint  Patient presents with  . Abdominal Pain  . Vaginal Bleeding   Ms. Courtney Barnett is a 29 y.o. G3P1011 at [redacted]w[redacted]d who presents to MAU for vaginal bleeding which began around 1245pm today. Pt reports she started having sharp mid-line pelvic pain and sharp vaginal pain. Pt describes bleeding as menstrual-like bleeding that is "not super heavy but enough to soak my underwear." Pt reports the bleeding soaked her underwear once and reports continued bleeding in MAU but states that it is not as heavy as it was previously.  Passing blood clots? no Blood soaking clothes? no Lightheaded/dizzy? no Significant pelvic pain or cramping? Per above  Current pregnancy problems? Miscarriage 6 months ago Blood Type? O Positive Allergies? NKDA Current medications? PNVs Current PNC & next appt? Dr. Hyacinth Meeker, [facility], 06/13/2019 for [place]  Pt denies vaginal discharge/odor/itching. Pt denies N/V, abdominal pain, constipation, diarrhea, or urinary problems. Pt denies fever, chills, fatigue, sweating or changes in appetite. Pt denies SOB or chest pain. Pt denies dizziness, HA, light-headedness, weakness.   OB History    Gravida  3   Para  1   Term  1   Preterm      AB  1   Living  1     SAB  1   TAB      Ectopic      Multiple      Live Births  1           Past Medical History:  Diagnosis Date  . Anxiety   . Biliary colic 12/13/2016  . Vasovagal syncope     Past Surgical History:  Procedure Laterality Date  . BREAST SURGERY Left 03/2018   biopsy  . CHOLECYSTECTOMY  12/20/2016   Procedure: LAPAROSCOPIC CHOLECYSTECTOMY WITH INTRAOPERATIVE CHOLANGIOGRAM;  Surgeon: Ancil Linsey, MD;  Location: [facility] ORS;  Service: General;;  . INTRAUTERINE DEVICE (IUD) INSERTION  09/2013  . WISDOM TOOTH EXTRACTION       Family History  Problem Relation Age of Onset  . Diabetes Maternal Grandfather   . Colon cancer Neg Hx   . Stomach cancer Neg Hx   . Pancreatic cancer Neg Hx     Social History   Tobacco Use  . Smoking status: Never Smoker  . Smokeless tobacco: Never Used  Substance Use Topics  . Alcohol use: No  . Drug use: No    Allergies: No Known Allergies  Medications Prior to Admission  Medication Sig Dispense Refill Last Dose  . Prenatal Vit-Fe Fumarate-FA (PRENATAL VITAMIN PO) Take by mouth.       Review of Systems  Constitutional: Negative for chills, diaphoresis, fatigue and fever.  Eyes: Negative for visual disturbance.  Respiratory: Negative for shortness of breath.   Cardiovascular: Negative for chest pain.  Gastrointestinal: Negative for abdominal pain, constipation, diarrhea, nausea and vomiting.  Genitourinary: Positive for pelvic pain, vaginal bleeding and vaginal pain. Negative for dysuria, flank pain, frequency, urgency and vaginal discharge.  Neurological: Negative for dizziness, weakness, light-headedness and headaches.   Physical Exam   Blood pressure 110/74, pulse (!) 107, temperature 98.6 F (37 C), temperature source Oral, resp. rate 16, last menstrual period 04/26/2019, SpO2 100 %, unknown if currently breastfeeding.  Patient Vitals for the past 24 hrs:  BP Temp Temp src Pulse Resp SpO2  06/01/19 1354 110/74 98.6 F (37 C) Oral (!) 107 16 100 %   Physical Exam  Constitutional: She is oriented to person, place, and time. She appears well-developed and well-nourished. No distress.  HENT:  Head: Normocephalic and atraumatic.  Respiratory: Effort normal.  GI: Soft.  Genitourinary: There is no rash, tenderness or lesion on the right labia. There is no rash, tenderness or lesion on the left labia. Uterus is not enlarged and not tender. Cervix exhibits no motion tenderness, no discharge and no friability. Right adnexum displays no mass, no tenderness and no  fullness. Left adnexum displays no mass, no tenderness and no fullness.    Vaginal bleeding (small amount of dark red/brown blood present coating vaginal walls, small clot present in fornix, no active bleeding noted.) present.     No vaginal discharge or tenderness.  There is bleeding (small amount of dark red/brown blood present coating vaginal walls, small clot present in fornix, no active bleeding noted.) in the vagina. No tenderness in the vagina.    Genitourinary Comments: Cervix closed.   Neurological: She is alert and oriented to person, place, and time.  Skin: Skin is warm and dry. She is not diaphoretic.  Psychiatric: She has a normal mood and affect. Her behavior is normal. Judgment and thought content normal.    Results for orders placed or performed during the hospital encounter of 06/01/19 (from the past 24 hour(s))  CBC     Status: None   Collection Time: 06/01/19  2:57 PM  Result Value Ref Range   WBC 8.9 4.0 - 10.5 K/uL   RBC 4.32 3.87 - 5.11 MIL/uL   Hemoglobin 12.7 12.0 - 15.0 g/dL   HCT 33.8 25.0 - 53.9 %   MCV 88.2 80.0 - 100.0 fL   MCH 29.4 26.0 - 34.0 pg   MCHC 33.3 30.0 - 36.0 g/dL   RDW 76.7 34.1 - 93.7 %   Platelets 334 150 - 400 K/uL   nRBC 0.0 0.0 - 0.2 %  Comprehensive metabolic panel     Status: Abnormal   Collection Time: 06/01/19  2:57 PM  Result Value Ref Range   Sodium 140 135 - 145 mmol/L   Potassium 3.9 3.5 - 5.1 mmol/L   Chloride 105 98 - 111 mmol/L   CO2 24 22 - 32 mmol/L   Glucose, Bld 98 70 - 99 mg/dL   BUN 10 6 - 20 mg/dL   Creatinine, Ser 9.02 0.44 - 1.00 mg/dL   Calcium 9.6 8.9 - 40.9 mg/dL   Total Protein 7.0 6.5 - 8.1 g/dL   Albumin 4.3 3.5 - 5.0 g/dL   AST 18 15 - 41 U/L   ALT 18 0 - 44 U/L   Alkaline Phosphatase 45 38 - 126 U/L   Total Bilirubin 1.8 (H) 0.3 - 1.2 mg/dL   GFR calc non Af Amer >60 >60 mL/min   GFR calc Af Amer >60 >60 mL/min   Anion gap 11 5 - 15  hCG, quantitative, pregnancy     Status: Abnormal   Collection  Time: 06/01/19  2:57 PM  Result Value Ref Range   hCG, Beta Chain, Quant, S 11,950 (H) <5 mIU/mL  Urinalysis, Routine w reflex microscopic     Status: Abnormal   Collection Time: 06/01/19  4:07 PM  Result Value Ref Range   Color, Urine STRAW (A) YELLOW   APPearance CLEAR CLEAR   Specific Gravity, Urine 1.006 1.005 - 1.030   pH 6.0 5.0 -  8.0   Glucose, UA NEGATIVE NEGATIVE mg/dL   Hgb urine dipstick MODERATE (A) NEGATIVE   Bilirubin Urine NEGATIVE NEGATIVE   Ketones, ur NEGATIVE NEGATIVE mg/dL   Protein, ur NEGATIVE NEGATIVE mg/dL   Nitrite NEGATIVE NEGATIVE   Leukocytes,Ua NEGATIVE NEGATIVE   WBC, UA 0-5 0 - 5 WBC/hpf   Bacteria, UA RARE (A) NONE SEEN   Squamous Epithelial / LPF 0-5 0 - 5   Mucus PRESENT   Wet prep, genital     Status: Abnormal   Collection Time: 06/01/19  4:07 PM   Specimen: Cervix  Result Value Ref Range   Yeast Wet Prep HPF POC NONE SEEN NONE SEEN   Trich, Wet Prep NONE SEEN NONE SEEN   Clue Cells Wet Prep HPF POC NONE SEEN NONE SEEN   WBC, Wet Prep HPF POC FEW (A) NONE SEEN   Sperm NONE SEEN    US OB LESS THAN 14 WEEKS WITH OB TRANSVAGINAL  Result Date: 06/01/2019 CLINICAL DATA:  Pregnant patient in first-trimester pregnancy with vaginal bleeding. Last menstrual period 04/26/2019 for gestational age [redacted] weeks 1 day. EXAM: OBSTETRIC <14 WK [place] AND TRANSVAGINAL OB US TECHNIQUE: Both transabdominal and transvaginal ultrasound examinations were performed for complete evaluation of the gestation as well as the maternal uterus, adnexal regions, and pelvic cul-de-sac. Transvaginal technique was performed to assess early pregnancy. COMPARISON:  None. FINDINGS: Intrauterine gestational sac: Single Yolk sac:  Visualized. Embryo:  Not Visualized. Cardiac Activity: Not Visualized. MSD: 8.5 mm   5 w   4 d Subchorionic hemorrhage:  None visualized. Maternal uterus/adnexae: The uterus is retroflexed. Intrauterine gestational sac contains a yolk sac. No subchorionic hemorrhage.  Both ovaries are visualized and are normal. No adnexal mass. Small amount of free fluid in the pelvis. IMPRESSION: 1. Early intrauterine gestational sac containing a yolk sac, but fetal pole or cardiac activity yet visualized. Recommend follow-up quantitative B-HCG levels and follow-up US in 10-14 days to assess viability. This recommendation follows SRU consensus guidelines: Diagnostic Criteria for Nonviable Pregnancy Early in the First Trimester. Malva Limes Med 2013; 245:8099-83. 2. No subchorionic hemorrhage.  No evidence of adnexal mass. Electronically Signed   By: Narda Rutherford M.D.   On: 06/01/2019 15:12   MAU Course  Procedures  MDM -r/o ectopic -UA: straw/mod hgb/rare bacteria, sending urine for culture -CBC: WNL -CMP: no abnormalities requiring treatment -[place]: single GS, +yolk sac, [redacted]w[redacted]d -hCG: 11,950 -ABO: O Positive -WetPrep: WNL -GC/CT collected -pt discharged to home in stable condition  Orders Placed This Encounter  Procedures  . Wet prep, genital    Standing Status:   Standing    Number of Occurrences:   1  . Culture, OB Urine    Standing Status:   Standing    Number of Occurrences:   1  . US OB LESS THAN 14 WEEKS WITH OB TRANSVAGINAL    Standing Status:   Standing    Number of Occurrences:   1    Order Specific Question:   Symptom/Reason for Exam    Answer:   Vaginal bleeding in pregnancy [[id number]]  . Urinalysis, Routine w reflex microscopic    Standing Status:   Standing    Number of Occurrences:   1  . CBC    Standing Status:   Standing    Number of Occurrences:   1  . Comprehensive metabolic panel    Standing Status:   Standing    Number of Occurrences:   1  . hCG,  quantitative, pregnancy    Standing Status:   Standing    Number of Occurrences:   1  . Discharge patient    Order Specific Question:   Discharge disposition    Answer:   01-Home or Self Care [1]    Order Specific Question:   Discharge patient date    Answer:   06/01/2019   No orders of the  defined types were placed in this encounter.   Assessment and Plan   1. Vaginal bleeding in pregnancy   2. Blood type, Rh positive   3. Intrauterine pregnancy   4. [redacted] weeks gestation of pregnancy    Allergies as of 06/01/2019   No Known Allergies     Medication List    TAKE these medications   PRENATAL VITAMIN PO Take by mouth.      -will call with culture results, if positive -pt has repeat US scheduled for 06/13/2019, pt advised to keep appt for viability  -discussed causes of bleeding in early pregnancy -return MAU precautions given -pt discharged to home in stable condition  Elmyra Ricks E Zlata Alcaide 06/01/2019, 4:42 PM

## 2019-06-01 NOTE — MAU Note (Signed)
Courtney Barnett is a 29 y.o. at [redacted]w[redacted]d here in MAU reporting: states she thinks she is having a miscarriage. Is having sharp pain and bleeding. Pain and bleeding started 45 minutes ago. States underwear is soaked, doesn't have pads. Feels like she needs to push something out.  Onset of complaint: today  Pain score: 10/10  Vitals:   06/01/19 1354  BP: 110/74  Pulse: (!) 107  Resp: 16  Temp: 98.6 F (37 C)  SpO2: 100%     Lab orders placed from triage: UA

## 2019-06-02 LAB — CULTURE, OB URINE: Culture: NO GROWTH

## 2019-06-03 LAB — GC/CHLAMYDIA PROBE AMP (~~LOC~~) NOT AT ARMC
Chlamydia: NEGATIVE
Comment: NEGATIVE
Comment: NORMAL
Neisseria Gonorrhea: NEGATIVE

## 2019-06-04 ENCOUNTER — Telehealth: Payer: Self-pay | Admitting: Obstetrics & Gynecology

## 2019-06-04 ENCOUNTER — Encounter: Payer: Self-pay | Admitting: Obstetrics & Gynecology

## 2019-06-04 NOTE — Telephone Encounter (Signed)
Could do OV tomorrow and repeat HCG tomorrow.  She was seen 4/17 in MAU and follow up HCG was recommended.  This note wasn't routed to me.

## 2019-06-04 NOTE — Telephone Encounter (Signed)
Spoke with patient, advised per Dr. Hyacinth Meeker. OV scheduled for 4/21 at 11:45am with Dr. Hyacinth Meeker. MAU precautions reviewed. Covid 19 prescreen negative. Patient verbalizes understanding and is agreeable.   Routing to provider for final review. Patient is agreeable to disposition. Will close encounter.

## 2019-06-04 NOTE — Telephone Encounter (Signed)
Spoke with patient. Patient was seen in MAU on 06/01/19 for vaginal bleeding and sharp pain. Patient reports vaginal bleeding has resolved, intermittent pain continues. Describes as right sided pelvic pain, sharp, shooting, 5/10. Nausea, no vomiting. Is scheduled for PUS on 06/13/19. PUS was completed in MAU on 06/01/19. Advised patient I will provide update to Dr. Hyacinth Meeker and f/u with recommendations, patient agreeable.   Dr. Hyacinth Meeker -please review and advise.

## 2019-06-04 NOTE — Telephone Encounter (Signed)
Visit Follow-Up Question Received: Today Message Contents  Gwynn Burly, [name] sent to [facility] Clinical Pool  Phone Number: [phone]  I am [redacted] weeks pregnant [facility] severe sharp abdominal pain and vaginal bleeding. I am continuing to have sharp pain off and on.

## 2019-06-05 ENCOUNTER — Encounter: Payer: Self-pay | Admitting: *Deleted

## 2019-06-05 ENCOUNTER — Encounter: Payer: Self-pay | Admitting: Obstetrics & Gynecology

## 2019-06-05 ENCOUNTER — Ambulatory Visit: Payer: Self-pay | Admitting: Obstetrics & Gynecology

## 2019-06-05 ENCOUNTER — Ambulatory Visit: Payer: 59 | Admitting: Obstetrics & Gynecology

## 2019-06-05 ENCOUNTER — Other Ambulatory Visit: Payer: Self-pay

## 2019-06-05 ENCOUNTER — Telehealth: Payer: Self-pay

## 2019-06-05 VITALS — BP 102/60 | HR 96 | Temp 97.9°F | Wt 122.2 lb

## 2019-06-05 DIAGNOSIS — O200 Threatened abortion: Secondary | ICD-10-CM

## 2019-06-05 LAB — BETA HCG QUANT (REF LAB): hCG Quant: 18973 m[IU]/mL

## 2019-06-05 NOTE — Progress Notes (Signed)
GYNECOLOGY  VISIT  CC:   Patient complains of having vaginal bleeding and abdominal pain. Per patient, abdominal pain was sharp that can range sometimes from 8-10. Patient states that she no longer has the bleeding.   HPI: 29 y.o. G41P1011 Married White or Caucasian female here for f/u from MAU visit.  Pt has bright red bleeding on 4/17 and was seen at the ER.  She had bleeding that was red into Sunday but that has gone and was dark on Monday and Tuesday.  She's noted no bleeding today.  She is still having cramping in the RLQ.  She is having some sharp intermittent pain as well    Pt reports she had diarrhea for about a week but today she had a normal bowel movement.  Denies dysuria, urgency, hematuria.    Is not taking anything for pain but would like to know if tylenol is safe.  GYNECOLOGIC HISTORY: Patient's last menstrual period was 04/26/2019 (exact date). Contraception: none Menopausal hormone therapy: n/a  Patient Active Problem List   Diagnosis Date Noted  . GAD (generalized anxiety disorder) 03/19/2018  . 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 Calculus of gallbladder without cholecystitis without obstruction   . Biliary colic 12/13/2016  . 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 Healthcare maintenance 04/07/2016    Past Medical History:  Diagnosis Date  . Anxiety   . Biliary colic 12/13/2016  . Vasovagal syncope     Past Surgical History:  Procedure Laterality Date  . BREAST SURGERY Left 03/2018   biopsy  . CHOLECYSTECTOMY  12/20/2016   Procedure: LAPAROSCOPIC CHOLECYSTECTOMY WITH INTRAOPERATIVE CHOLANGIOGRAM;  Surgeon: Ancil Linsey, MD;  Location: [facility] ORS;  Service: General;;  . INTRAUTERINE DEVICE (IUD) INSERTION  09/2013  . WISDOM TOOTH EXTRACTION      MEDS:   Current Outpatient Medications on File Prior to Visit  Medication Sig Dispense Refill  . Prenatal Vit-Fe Fumarate-FA (PRENATAL VITAMIN PO) Take by mouth.     No current facility-administered medications on file  prior to visit.    ALLERGIES: Patient has no known allergies.  Family History  Problem Relation Age of Onset  . Diabetes Maternal Grandfather   . Colon cancer Neg Hx   . Stomach cancer Neg Hx   . Pancreatic cancer Neg Hx     SH:  Married, non smoker  Review of Systems  Gastrointestinal: Positive for abdominal pain.  Genitourinary: Positive for vaginal bleeding.    PHYSICAL EXAMINATION:    BP 102/60 (BP Location: Right Arm, Patient Position: Sitting, Cuff Size: Normal)   Pulse 96   Temp 97.9 F (36.6 C) (Temporal)   Wt 122 lb 3.2 oz (55.4 kg)   LMP 04/26/2019 (Exact Date)   BMI 21.65 kg/m     General appearance: alert, cooperative and appears stated age Abdomen: soft, non-tender; bowel sounds normal; no masses,  no organomegaly Lymph:  no inguinal LAD noted  Pelvic: External genitalia:  no lesions              Urethra:  normal appearing urethra with no masses, tenderness or lesions              Bartholins and Skenes: normal                 Vagina: normal appearing vagina with normal color and discharge, no lesions              Cervix: no lesions and closed and no blood present  Bimanual Exam:  Uterus:  full about 6 weeks size              Adnexa: no mass, fullness, tenderness  Chaperone, Dorethea Clan, CMA, was present for exam.  Assessment: First trimester bleeding, hopefully implantation bleeding LLQ pain Ultrasound showing intrauterine yolk sac  Plan: Stat HCG today.  If level is going up appropriately, plan repeat PUS next Thursday as already scheduled. MBT is O+

## 2019-06-05 NOTE — Telephone Encounter (Signed)
Spoke with pt. Pt given results and recommendations  of Beta HCG per Dr Hyacinth Meeker. Pt agreeable and verbalized understanding.   Pt scheduled for repeat PUS on 4/22 at 4:30pm. Pt verbalized understanding.   Routing to Dr Hyacinth Meeker for review.  Encounter closed.  Cc: Hayley, already aware of change of PUS.

## 2019-06-05 NOTE — Telephone Encounter (Signed)
-----   Message from Jerene Bears, MD sent at 06/05/2019  1:58 PM EDT ----- Routed initial message to Cornelia Copa but meant to route to Gershon Crane, Charity fundraiser.  Please see if pt will come for PUS tomorrow due to continued pain and HCG not increasing as much as I would expect at this point.  Thanks.

## 2019-06-06 ENCOUNTER — Encounter: Payer: Self-pay | Admitting: Obstetrics & Gynecology

## 2019-06-06 ENCOUNTER — Other Ambulatory Visit: Payer: Self-pay | Admitting: Obstetrics & Gynecology

## 2019-06-06 ENCOUNTER — Other Ambulatory Visit: Payer: Self-pay

## 2019-06-06 ENCOUNTER — Ambulatory Visit (INDEPENDENT_AMBULATORY_CARE_PROVIDER_SITE_OTHER): Payer: 59 | Admitting: Obstetrics & Gynecology

## 2019-06-06 ENCOUNTER — Ambulatory Visit (INDEPENDENT_AMBULATORY_CARE_PROVIDER_SITE_OTHER): Payer: 59

## 2019-06-06 VITALS — BP 110/68 | HR 68 | Temp 98.1°F | Resp 16 | Wt 122.0 lb

## 2019-06-06 DIAGNOSIS — O468X1 Other antepartum hemorrhage, first trimester: Secondary | ICD-10-CM

## 2019-06-06 DIAGNOSIS — Z3687 Encounter for antenatal screening for uncertain dates: Secondary | ICD-10-CM

## 2019-06-06 DIAGNOSIS — O200 Threatened abortion: Secondary | ICD-10-CM | POA: Diagnosis not present

## 2019-06-06 DIAGNOSIS — O418X10 Other specified disorders of amniotic fluid and membranes, first trimester, not applicable or unspecified: Secondary | ICD-10-CM | POA: Diagnosis not present

## 2019-06-06 DIAGNOSIS — N912 Amenorrhea, unspecified: Secondary | ICD-10-CM | POA: Diagnosis not present

## 2019-06-06 NOTE — Progress Notes (Signed)
29 y.o. G67P1011 Married White or Caucasian female here for viability ultrasound.  Pt was seen in MAU on 06/01/2019 due to vaginal bleeding.  Yolk sac noted in gestational sac but no fetal pole was noted.  Did have dark bleeding for a couple more days.  This has stopped.  She is having some RLQ pain still.  Denies any active red bleeding.5  Patient's last menstrual period was 04/26/2019 (exact date).Domenic Polite by LMP is 5 5/7.  Scheduled Meds: PNV only  Findings:  UTERUS: Gestational sac:  present, yolk sac:  present,  Fetal pole present.  CRL 0.14cm.   Fetal cardiac activity present: present at 107 bpm ADNEXA: Left ovary: 2.5 x 1.9 with corpus luteum present       Right ovary: 2.4 x 1.8cm CUL DE SAC: no free fluid  Subchorionic hemorrhage was noted measuring 14 x 66mm.  Discussion:  Findings reviewed.  Dating is consistent with dating by LMP.  [facility] 01/31/2020. Subchorionic hemorrhage noted measuring 14 x 59mm.  Pt has questions about this that were noted.  This does explain bleeding last weekend.  Advised she may continue to have some spotting but this should be dark and old in appearance.  Advised calling with any new bright red bleeding.  Pt wants advice about ob practices.  Last child was delivered in [place].  Lives in [place].     Assessment:  Threatened abortion with normal ultrasound showing +FCA  Plan:  She will plan to transfer care around 10 weeks.  For now, will have pelvic rest and be careful with heavy lifting.  She was advised to call with any questions/concerns.  Also, advised to let me know where she decides to go for OB care.  Options were discussed today.  20 minutes total spent with pt in discussion after pictures reviewed/results given.

## 2019-06-07 ENCOUNTER — Telehealth: Payer: Self-pay

## 2019-06-07 NOTE — Telephone Encounter (Signed)
Patient notified per Dr Doran Durand she should have pelvic rest for the next two weeks. Intercourse does not cause subchorionic bleeding. Nor does it cause miscarriages. But we don't want her to worry if she has any bleeding after intercourse. I would recommend waiting two weeks to allow time for the subchorionic hemorrhage to resolve and then she should be fine.  Patient agrees.

## 2019-06-10 ENCOUNTER — Telehealth: Payer: Self-pay | Admitting: *Deleted

## 2019-06-10 NOTE — Telephone Encounter (Signed)
Patient is scheduled for ultrasound on Thursday and was just in last week. Should she  keep this week's ultrasound appointment?

## 2019-06-10 NOTE — Telephone Encounter (Signed)
Call reviewed with Dr. Hyacinth Meeker.   Spoke with patient. Patient denies any vaginal bleeding. Denies pain at this time, 0/10,  pain is intermittent. Reports nausea, no vomiting. MAU precautions reviewed. PUS cancelled for 06/13/19. Patient is aware to call if any questions/concerns.   Routing to provider for final review. Patient is agreeable to disposition. Will close encounter.

## 2019-06-13 ENCOUNTER — Other Ambulatory Visit: Payer: Self-pay

## 2019-06-13 ENCOUNTER — Other Ambulatory Visit: Payer: Self-pay | Admitting: Obstetrics & Gynecology

## 2019-06-14 ENCOUNTER — Inpatient Hospital Stay (HOSPITAL_COMMUNITY)
Admission: AD | Admit: 2019-06-14 | Discharge: 2019-06-14 | Disposition: A | Discharge status: Home / Self Care | Payer: 59 | Attending: Obstetrics & Gynecology | Admitting: Obstetrics & Gynecology

## 2019-06-14 ENCOUNTER — Telehealth: Payer: Self-pay | Admitting: *Deleted

## 2019-06-14 ENCOUNTER — Encounter (HOSPITAL_COMMUNITY): Payer: Self-pay | Admitting: Obstetrics & Gynecology

## 2019-06-14 ENCOUNTER — Other Ambulatory Visit: Payer: Self-pay

## 2019-06-14 DIAGNOSIS — Z79899 Other long term (current) drug therapy: Secondary | ICD-10-CM | POA: Diagnosis not present

## 2019-06-14 DIAGNOSIS — O26891 Other specified pregnancy related conditions, first trimester: Secondary | ICD-10-CM | POA: Diagnosis not present

## 2019-06-14 DIAGNOSIS — Z3A22 22 weeks gestation of pregnancy: Secondary | ICD-10-CM | POA: Diagnosis not present

## 2019-06-14 DIAGNOSIS — R197 Diarrhea, unspecified: Secondary | ICD-10-CM | POA: Diagnosis not present

## 2019-06-14 DIAGNOSIS — O99891 Other specified diseases and conditions complicating pregnancy: Secondary | ICD-10-CM | POA: Insufficient documentation

## 2019-06-14 DIAGNOSIS — O219 Vomiting of pregnancy, unspecified: Secondary | ICD-10-CM | POA: Insufficient documentation

## 2019-06-14 DIAGNOSIS — Z3A01 Less than 8 weeks gestation of pregnancy: Secondary | ICD-10-CM | POA: Insufficient documentation

## 2019-06-14 DIAGNOSIS — R519 Headache, unspecified: Secondary | ICD-10-CM | POA: Insufficient documentation

## 2019-06-14 LAB — URINALYSIS, ROUTINE W REFLEX MICROSCOPIC
Bilirubin Urine: NEGATIVE
Glucose, UA: NEGATIVE mg/dL
Hgb urine dipstick: NEGATIVE
Ketones, ur: 20 mg/dL — AB
Leukocytes,Ua: NEGATIVE
Nitrite: NEGATIVE
Protein, ur: NEGATIVE mg/dL
Specific Gravity, Urine: 1.013 (ref 1.005–1.030)
pH: 7 (ref 5.0–8.0)

## 2019-06-14 MED ORDER — ONDANSETRON HCL 4 MG/2ML IJ SOLN
4.0000 mg | Freq: Once | INTRAMUSCULAR | Status: AC
  Administered 2019-06-14: 4 mg via INTRAVENOUS
  Filled 2019-06-14: qty 2

## 2019-06-14 MED ORDER — ONDANSETRON 4 MG PO TBDP: 4.0000 mg | ORAL_TABLET | Freq: Three times a day (TID) | ORAL | 0 refills | Status: DC | PRN

## 2019-06-14 MED ORDER — LACTATED RINGERS IV BOLUS
1000.0000 mL | Freq: Once | INTRAVENOUS | Status: AC
  Administered 2019-06-14: 11:00:00 1000 mL via INTRAVENOUS

## 2019-06-14 MED ORDER — METOCLOPRAMIDE HCL 10 MG PO TABS: 10.0000 mg | ORAL_TABLET | Freq: Three times a day (TID) | ORAL | 2 refills | Status: DC | PRN

## 2019-06-14 MED ORDER — PROMETHAZINE HCL 12.5 MG PO TABS: 12.5000 mg | ORAL_TABLET | Freq: Four times a day (QID) | ORAL | 0 refills | Status: DC | PRN

## 2019-06-14 MED ORDER — METOCLOPRAMIDE HCL 5 MG/ML IJ SOLN
10.0000 mg | Freq: Once | INTRAMUSCULAR | Status: AC
  Administered 2019-06-14: 10 mg via INTRAVENOUS
  Filled 2019-06-14: qty 2

## 2019-06-14 NOTE — MAU Note (Signed)
Been having vomiting and diarrhea(loose)  since 0400. Called office, told to come in, "more then morning sickness"

## 2019-06-14 NOTE — Telephone Encounter (Signed)
Call reviewed with Dr. Hyacinth Meeker, call returned to patient.   Patient states she woke up this morning at 4am with nausea, now vomiting with diarrhea and headache. Unable to tolerate fluids. Chills, no fever. Mild pelvic cramping, no vaginal bleeding. Voided once this morning so far, urine was "super yellow and foggy, unsure if any odor". Patient is concerned symptoms may be from something she ate. Denies lightheadedness, weakness.   Rx for phenergan 12.5 mg PO q6h prn for n/V. #20/0RF to verified pharmacy.  Advised patient medication may make her drowsy, precautions reviewed.   MAU precautions reviewed for new or worsening symptoms. Patient will try phenergan first, discussed trying sips of fluids to stay hydrated. Advised patient I will provide update to Dr. Hyacinth Meeker, our office will return call if any additional recommendations. Advised patient to return call to office if she has any additional questions/concerns.    Routing to Dr. Hyacinth Meeker for final review.

## 2019-06-14 NOTE — Telephone Encounter (Signed)
Patient is [redacted] weeks pregnant and woke this morning around 4 am with diarrhea and throwing up.

## 2019-06-14 NOTE — MAU Provider Note (Signed)
Chief Complaint: No chief complaint on file.  First Provider Initiated Contact with Patient 06/14/19 1036      SUBJECTIVE HPI: Courtney Barnett is a 29 y.o. G3P1011 at [redacted]w[redacted]d by LMP who presents to maternity admissions reporting nausea and vomiting since around 0400 this morning. Also with headache and diarrhea since that time. Last time she vomited was around [place]. Vomit consists of yellow-green color. Feeling nauseous currently. Reports nausea for past 2 weeks but today was the first time she has vomited repeatedly. Denies sick contacts. Some chills earlier but no fever. Headache is mostly located in the back of her head. She has had three episodes of diarrhea and 7 episodes of vomiting since this morning. She ate normally yesterday. She has not tried anything for nausea. She tried Gatorade this morning and subsequently vomited. Reports abdominal cramping since this morning. Denies SOB, cough, URI symptoms. She denies vaginal bleeding, vaginal itching/burning, urinary symptoms, dizziness or fever.    Past Medical History:  Diagnosis Date  . Anxiety   . Biliary colic 26/83/4196  . Vasovagal syncope    Past Surgical History:  Procedure Laterality Date  . BREAST SURGERY Left 03/2018   biopsy  . CHOLECYSTECTOMY  12/20/2016   Procedure: LAPAROSCOPIC CHOLECYSTECTOMY WITH INTRAOPERATIVE CHOLANGIOGRAM;  Surgeon: Vickie Epley, MD;  Location: [facility] ORS;  Service: General;;  . INTRAUTERINE DEVICE (IUD) INSERTION  09/2013  . WISDOM TOOTH EXTRACTION     Social History   Socioeconomic History  . Marital status: Married    Spouse name: Not on file  . Number of children: Not on file  . Years of education: Not on file  . Highest education level: Not on file  Occupational History  . Not on file  Tobacco Use  . Smoking status: Never Smoker  . Smokeless tobacco: Never Used  Substance and Sexual Activity  . Alcohol use: No  . Drug use: No  . Sexual activity: Yes    Partners: Male    Birth  control/protection: None  Other Topics Concern  . Not on file  Social History Narrative  . Not on file   Social Determinants of Health   Financial Resource Strain:   . Difficulty of Paying Living Expenses:   Food Insecurity:   . Worried About Charity fundraiser in the Last Year:   . Arboriculturist in the Last Year:   Transportation Needs:   . Film/video editor (Medical):   Marland Kitchen Lack of Transportation (Non-Medical):   Physical Activity:   . Days of Exercise per Week:   . Minutes of Exercise per Session:   Stress:   . Feeling of Stress :   Social Connections:   . Frequency of Communication with Friends and Family:   . Frequency of Social Gatherings with Friends and Family:   . Attends Religious Services:   . Active Member of Clubs or Organizations:   . Attends Archivist Meetings:   Marland Kitchen Marital Status:   Intimate Partner Violence:   . Fear of Current or Ex-Partner:   . Emotionally Abused:   Marland Kitchen Physically Abused:   . Sexually Abused:    No current facility-administered medications on file prior to encounter.   Current Outpatient Medications on File Prior to Encounter  Medication Sig Dispense Refill  . Prenatal Vit-Fe Fumarate-FA (PRENATAL VITAMIN PO) Take by mouth.    . promethazine (PHENERGAN) 12.5 MG tablet Take 1 tablet (12.5 mg total) by mouth every 6 (six) hours as needed  for nausea or vomiting. 20 tablet 0   No Known Allergies  ROS:  Review of Systems All other systems negative unless noted above in HPI.   I have reviewed patient's Past Medical Hx, Surgical Hx, Family Hx, Social Hx, medications and allergies.   Physical Exam   Patient Vitals for the past 24 hrs:  BP Temp Temp src Pulse Resp SpO2 Height Weight  06/14/19 1215 101/66 98.6 F (37 C) Oral 95 15 - - -  06/14/19 1201 113/72 - - (!) 101 - - - -  06/14/19 1128 (!) 101/58 - - 87 - - - -  06/14/19 1125 104/62 98.9 F (37.2 C) Oral 87 15 100 % - -  06/14/19 1022 109/68 99.1 F (37.3 C)  Oral 83 16 - - -  06/14/19 1007 109/73 98.8 F (37.1 C) Oral 90 16 100 % 5\' 3"  (1.6 m) 54.4 kg   Constitutional: Well-developed, well-nourished female in no acute distress.  Cardiovascular: normal rate Respiratory: normal effort GI: Abd soft, non-tender.  MS: Extremities nontender, no edema, normal ROM Neurologic: Alert and oriented x 4.  GU: Neg CVAT. Psych: Normal mood and affect. Neuro: Grossly normal.  LAB RESULTS Results for orders placed or performed during the hospital encounter of 06/14/19 (from the past 24 hour(s))  Urinalysis, Routine w reflex microscopic     Status: Abnormal   Collection Time: 06/14/19 10:07 AM  Result Value Ref Range   Color, Urine YELLOW YELLOW   APPearance CLOUDY (A) CLEAR   Specific Gravity, Urine 1.013 1.005 - 1.030   pH 7.0 5.0 - 8.0   Glucose, UA NEGATIVE NEGATIVE mg/dL   Hgb urine dipstick NEGATIVE NEGATIVE   Bilirubin Urine NEGATIVE NEGATIVE   Ketones, ur 20 (A) NEGATIVE mg/dL   Protein, ur NEGATIVE NEGATIVE mg/dL   Nitrite NEGATIVE NEGATIVE   Leukocytes,Ua NEGATIVE NEGATIVE    --/--/O POS, O POS Performed at [facility], [address], [place], [place]  [phone])  IMAGING (86/38 1771 OB Transvaginal  Result Date: 06/06/2019 SEE PROGRESS NOTE  06/08/2019 OB LESS THAN 14 WEEKS WITH OB TRANSVAGINAL  Result Date: 06/01/2019 CLINICAL DATA:  Pregnant patient in first-trimester pregnancy with vaginal bleeding. Last menstrual period 04/26/2019 for gestational age [redacted] weeks 1 day. EXAM: OBSTETRIC <14 WK 06/26/2019 AND TRANSVAGINAL OB US TECHNIQUE: Both transabdominal and transvaginal ultrasound examinations were performed for complete evaluation of the gestation as well as the maternal uterus, adnexal regions, and pelvic cul-de-sac. Transvaginal technique was performed to assess early pregnancy. COMPARISON:  None. FINDINGS: Intrauterine gestational sac: Single Yolk sac:  Visualized. Embryo:  Not Visualized. Cardiac Activity: Not Visualized. MSD: 8.5  mm   5 w   4 d Subchorionic hemorrhage:  None visualized. Maternal uterus/adnexae: The uterus is retroflexed. Intrauterine gestational sac contains a yolk sac. No subchorionic hemorrhage. Both ovaries are visualized and are normal. No adnexal mass. Small amount of free fluid in the pelvis. IMPRESSION: 1. Early intrauterine gestational sac containing a yolk sac, but fetal pole or cardiac activity yet visualized. Recommend follow-up quantitative B-HCG levels and follow-up US in 10-14 days to assess viability. This recommendation follows SRU consensus guidelines: Diagnostic Criteria for Nonviable Pregnancy Early in the First Trimester. 11-09-1978 Med [id number]. 2. No subchorionic hemorrhage.  No evidence of adnexal mass. Electronically Signed   By: ; [id number] M.D.   On: 06/01/2019 15:12    MAU Management/MDM: Orders Placed This Encounter  Procedures  . Urinalysis, Routine w reflex microscopic  .  Orthostatic vital signs  . Contact Isolation: Enteric  . Discharge patient    Meds ordered this encounter  Medications  . metoCLOPramide (REGLAN) injection 10 mg  . lactated ringers bolus 1,000 mL  . ondansetron (ZOFRAN) injection 4 mg  . ondansetron (ZOFRAN ODT) 4 MG disintegrating tablet    Sig: Take 1 tablet (4 mg total) by mouth every 8 (eight) hours as needed for nausea or vomiting.    Dispense:  20 tablet    Refill:  0  . metoCLOPramide (REGLAN) 10 MG tablet    Sig: Take 1 tablet (10 mg total) by mouth 3 (three) times daily as needed for nausea or vomiting.    Dispense:  30 tablet    Refill:  2    Patient presents with nausea, vomiting, diarrhea and headache in first trimester. She has been having nausea throughout pregnancy thus far, but vomiting/diarrhea/headache are new this partum. No vaginal bleeding or specific OB concerns. No UTI symptoms. UA without evidence of infection. Reglan given for headache and nausea/vomiting along with 1 liter LR. Zofran also given. Patient with  improvement in nausea and headache. No diarrhea during MAU stay. Cramping mostly resolved and patient felt this was due to diarrhea that she had earlier. She was able to tolerate soda and crackers and felt comfortable discharging home. Reglan and Zofran prescribed on discharge. Recommended plenty of fluids as able to tolerate with bland diet. Pt discharged with strict return precautions.  ASSESSMENT 1. Nausea and vomiting in pregnancy prior to [redacted] weeks gestation   2. Pregnancy headache in first trimester   3. Diarrhea, unspecified type     PLAN Discharge home Allergies as of 06/14/2019   No Known Allergies     Medication List    TAKE these medications   metoCLOPramide 10 MG tablet Commonly known as: REGLAN Take 1 tablet (10 mg total) by mouth 3 (three) times daily as needed for nausea or vomiting.   ondansetron 4 MG disintegrating tablet Commonly known as: Zofran ODT Take 1 tablet (4 mg total) by mouth every 8 (eight) hours as needed for nausea or vomiting.   PRENATAL VITAMIN PO Take by mouth.   promethazine 12.5 MG tablet Commonly known as: PHENERGAN Take 1 tablet (12.5 mg total) by mouth every 6 (six) hours as needed for nausea or vomiting.        Jerilynn Birkenhead, MD [facility] Family Medicine Fellow, [facility] for [facility], [facility] 06/14/2019  12:25 PM

## 2019-06-14 NOTE — Telephone Encounter (Signed)
Encounter closed

## 2019-06-14 NOTE — Telephone Encounter (Signed)
Agree with recommendations.  Thanks.  Ok to close encounter.

## 2019-06-20 ENCOUNTER — Encounter: Payer: Self-pay | Admitting: Obstetrics & Gynecology

## 2019-06-27 ENCOUNTER — Encounter: Payer: Self-pay | Admitting: Obstetrics & Gynecology

## 2019-06-27 ENCOUNTER — Telehealth: Payer: Self-pay | Admitting: *Deleted

## 2019-06-27 NOTE — Telephone Encounter (Signed)
Spoke with patient. Patient reports cramping 5/10 that started today. Dark red mucous x1 after voiding this afternoon. Nausea, no vomiting, has RX for zofran. Hx subchorionic hemorrhage. OB appt on 07/16/19. Denies heavy bleeding, fever/chills. Advised patient I will review with Dr. Hyacinth Meeker and return call, patient agreeable.

## 2019-06-27 NOTE — Telephone Encounter (Signed)
See telephone encounter dated 06/27/19.   Encounter closed.

## 2019-06-27 NOTE — Telephone Encounter (Signed)
[place], [name]  P [facility] Clinical Pool  Phone Number: [phone]  Good Afternoon,  I have had light lower abdominal cramping all day and when I just went to the bathroom there was some brown/dark red stringy mucus. I just wondered if this is normal almost [redacted] weeks pregnant. Thank you!

## 2019-06-27 NOTE — Telephone Encounter (Signed)
Call reviewed with Dr. Hyacinth Meeker, call returned to patient.   o answer, left detailed message on mobile number, ok per dpr.  Advised patient office phones go off at 4pm. Advised ok to continue to monitor. Evaluation needed if heavy bleeding or severe pain develops, go directly to MAU for evaluation if afterhours. Return call to office if any additional questions.   Routing to Dr. Gloris Ham.   Encounter closed.

## 2019-07-03 ENCOUNTER — Encounter: Payer: Self-pay | Admitting: Obstetrics & Gynecology

## 2019-07-03 ENCOUNTER — Telehealth: Payer: Self-pay

## 2019-07-03 NOTE — Telephone Encounter (Signed)
Left message to call Kaia Depaolis, RN at [facility] [phone].   

## 2019-07-03 NOTE — Telephone Encounter (Signed)
Spoke with patient. Patient is pregnant, taking zofran for nausea, this has been helping, no vomiting. Patient reports constipation, asking if she can take a stool softer daily?   Last BM was today, reports small amount, "looked like little rocks". Previous BM on 5/14. Patient reports she is drinking fluids and has increased fiber in her diet with no changes. Currently taking Womens One a Day PNV. Advised patient I will update Dr. Hyacinth Meeker and f/u with recommendations. Patient verbalizes understanding and is agreeable.   Dr. Hyacinth Meeker -please advise on constipation.

## 2019-07-03 NOTE — Telephone Encounter (Signed)
[place], [name]  [facility] Clinical Pool  Phone Number: [phone]  Good Afternoon,   Is it safe to take a stool softener such as Colace while being pregnant and also taking Zofran? Thank you!

## 2019-07-03 NOTE — Telephone Encounter (Signed)
Patient left message on voicemail returning call.

## 2019-07-04 NOTE — Telephone Encounter (Signed)
Fibercon, metamucil and citrucel are all first line agents for constipation in pregnancy.  These need lots of water to work well so if not getting adequate water does need to work on increasing intake of water as well.  Just follow directions for use on package for these products.

## 2019-07-04 NOTE — Telephone Encounter (Signed)
Spoke with patient, advised as seen below per Dr. Miller. Patient verbalizes understanding and is agreeable.   Encounter closed.  

## 2019-07-09 ENCOUNTER — Encounter: Payer: Self-pay | Admitting: Obstetrics & Gynecology

## 2019-07-09 ENCOUNTER — Other Ambulatory Visit: Payer: Self-pay

## 2019-07-09 NOTE — Telephone Encounter (Signed)
Courtney Barnett, [name]  [facility] Clinical Pool  Phone Number: [phone]  Good morning,   Can I please request a prescription refill for Zofran due to continued nausea?

## 2019-07-09 NOTE — Telephone Encounter (Signed)
Medication refill request: Zofran Last OV:  06/06/19 SM Next AEX: none Last MMG (if hormonal medication request): n/a Refill authorized: Please advise on refill.

## 2019-07-10 ENCOUNTER — Other Ambulatory Visit: Payer: Self-pay | Admitting: Family Medicine

## 2019-07-10 MED ORDER — ONDANSETRON 4 MG PO TBDP: 4.0000 mg | ORAL_TABLET | Freq: Three times a day (TID) | ORAL | 0 refills | Status: DC | PRN

## 2019-07-12 DIAGNOSIS — Z3480 Encounter for supervision of other normal pregnancy, unspecified trimester: Secondary | ICD-10-CM | POA: Insufficient documentation

## 2019-07-12 NOTE — Progress Notes (Signed)
Last pap 03/30/2018- normal

## 2019-07-16 ENCOUNTER — Encounter: Payer: Self-pay | Admitting: Family Medicine

## 2019-07-16 ENCOUNTER — Other Ambulatory Visit: Payer: Self-pay

## 2019-07-16 ENCOUNTER — Ambulatory Visit (INDEPENDENT_AMBULATORY_CARE_PROVIDER_SITE_OTHER): Payer: No Typology Code available for payment source | Admitting: Family Medicine

## 2019-07-16 VITALS — BP 112/78 | HR 72 | Wt 124.0 lb

## 2019-07-16 DIAGNOSIS — Z3A11 11 weeks gestation of pregnancy: Secondary | ICD-10-CM

## 2019-07-16 DIAGNOSIS — Z3481 Encounter for supervision of other normal pregnancy, first trimester: Secondary | ICD-10-CM

## 2019-07-16 DIAGNOSIS — L719 Rosacea, unspecified: Secondary | ICD-10-CM | POA: Insufficient documentation

## 2019-07-16 DIAGNOSIS — Z3480 Encounter for supervision of other normal pregnancy, unspecified trimester: Secondary | ICD-10-CM

## 2019-07-16 DIAGNOSIS — O219 Vomiting of pregnancy, unspecified: Secondary | ICD-10-CM

## 2019-07-16 MED ORDER — METRONIDAZOLE 1 % EX GEL: Freq: Every day | CUTANEOUS | 3 refills | Status: DC

## 2019-07-16 MED ORDER — DOXYLAMINE-PYRIDOXINE 10-10 MG PO TBEC: 1.0000 | DELAYED_RELEASE_TABLET | Freq: Four times a day (QID) | ORAL | 2 refills | Status: DC

## 2019-07-16 NOTE — Progress Notes (Signed)
Subjective:   Courtney Barnett is a 29 y.o. G3P1011 at [redacted]w[redacted]d by LMP, early ultrasound being seen today for her first obstetrical visit.  Her obstetrical history is not significant. Patient does intend to breast feed. Pregnancy history fully reviewed.  Patient reports nausea.  HISTORY: OB History  Gravida Para Term Preterm AB Living  3 1 1  0 1 1  SAB TAB Ectopic Multiple Live Births  1 0 0 0 1    # Outcome Date GA Lbr Len/2nd Weight Sex Delivery Anes PTL Lv  3 Current           2 Term 2015     Vag-Spont   LIV  1 SAB            Last pap smear was  03/30/2018 and was normal Past Medical History:  Diagnosis Date  . Anxiety   . Biliary colic 26/94/8546  . Vasovagal syncope    Past Surgical History:  Procedure Laterality Date  . BREAST SURGERY Left 03/2018   biopsy  . CHOLECYSTECTOMY  12/20/2016   Procedure: LAPAROSCOPIC CHOLECYSTECTOMY WITH INTRAOPERATIVE CHOLANGIOGRAM;  Surgeon: Vickie Epley, MD;  Location: [facility] ORS;  Service: General;;  . INTRAUTERINE DEVICE (IUD) INSERTION  09/2013  . WISDOM TOOTH EXTRACTION     Family History  Problem Relation Age of Onset  . Diabetes Maternal Grandfather   . Colon cancer Neg Hx   . Stomach cancer Neg Hx   . Pancreatic cancer Neg Hx    Social History   Tobacco Use  . Smoking status: Never Smoker  . Smokeless tobacco: Never Used  Substance Use Topics  . Alcohol use: No  . Drug use: No   No Known Allergies Current Outpatient Medications on File Prior to Visit  Medication Sig Dispense Refill  . ondansetron (ZOFRAN ODT) 4 MG disintegrating tablet Take 1 tablet (4 mg total) by mouth every 8 (eight) hours as needed for nausea or vomiting. 20 tablet 0  . Prenatal Vit-Fe Fumarate-FA (PRENATAL VITAMIN PO) Take by mouth.    . metoCLOPramide (REGLAN) 10 MG tablet Take 1 tablet (10 mg total) by mouth 3 (three) times daily as needed for nausea or vomiting. 30 tablet 2  . promethazine (PHENERGAN) 12.5 MG tablet Take 1 tablet  (12.5 mg total) by mouth every 6 (six) hours as needed for nausea or vomiting. 20 tablet 0   No current facility-administered medications on file prior to visit.     Exam   Vitals:   07/16/19 0933  BP: 112/78  Pulse: 72  Weight: 124 lb (56.2 kg)   Fetal Heart Rate (bpm): 158  System: General: well-developed, well-nourished female in no acute distress   Skin: normal coloration and turgor, erythematous pustule and papules on face   Neurologic: oriented, normal, negative, normal mood   Extremities: normal strength, tone, and muscle mass, ROM of all joints is normal   HEENT Extraocular movement intact and sclera clear, anicteric   Mouth/Teeth mucous membranes moist, pharynx normal without lesions and dental hygiene good   Neck supple and no masses   Cardiovascular: regular rate and rhythm   Respiratory:  no respiratory distress, normal breath sounds   Abdomen: soft, non-tender; bowel sounds normal; no masses,  no organomegaly     Assessment:   Pregnancy: G3P1011 Patient Active Problem List   Diagnosis Date Noted  . Supervision of normal pregnancy in first trimester 07/12/2019  . GAD (generalized anxiety disorder) 03/19/2018  . 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  Calculus of gallbladder without cholecystitis without obstruction   . Biliary colic 12/13/2016  . Vasovagal syndrome 04/07/2016     Plan:  1. Supervision of other normal pregnancy, antepartum New OB labs + genetic screen with [facility]--did not have testing with 1st pregnancy. - CBC/D/Plt+RPR+Rh+ABO+Rub Ab... - Genetic Screening - Enroll patient in Babyscripts Program - [place] MFM OB COMP + 14 WK; Future  2. Rosacea Worsening with pregnancy--trial of Metrogel - metroNIDAZOLE (METROGEL) 1 % gel; Apply topically daily.  Dispense: 45 g; Refill: 3   3. Nausea and vomiting of pregnancy, antepartum Add Diclegis, multi-modal treatment discussed. - Doxylamine-Pyridoxine (DICLEGIS) 10-10 MG TBEC; Take 1 tablet by mouth in the  morning, at noon, in the evening, and at bedtime. Take 2 at bedtime, take 1 q am prn and 1 at lunch prn  Dispense: 100 tablet; Refill: 2   Initial labs drawn. Continue prenatal vitamins. Genetic Screening discussed, NIPS: ordered. Ultrasound discussed; fetal anatomic survey: ordered. Problem list reviewed and updated. The nature of [facility] with multiple MDs and other Advanced Practice Providers was explained to patient; also emphasized that residents, students are part of our team. Routine obstetric precautions reviewed. Return in about 5 weeks (around 08/20/2019) for in person.

## 2019-07-17 LAB — CBC/D/PLT+RPR+RH+ABO+RUB AB...
Antibody Screen: NEGATIVE
Basophils Absolute: 0 10*3/uL (ref 0.0–0.2)
Basos: 1 %
EOS (ABSOLUTE): 0.1 10*3/uL (ref 0.0–0.4)
Eos: 1 %
HCV Ab: <0.1 s/co ratio (ref 0.0–0.9)
HIV Screen 4th Generation wRfx: NONREACTIVE
Hematocrit: 35.5 % (ref 34.0–46.6)
Hemoglobin: 11.9 g/dL (ref 11.1–15.9)
Hepatitis B Surface Ag: NEGATIVE
Immature Grans (Abs): 0 10*3/uL (ref 0.0–0.1)
Immature Granulocytes: 0 %
Lymphocytes Absolute: 1.2 10*3/uL (ref 0.7–3.1)
Lymphs: 15 %
MCH: 29.4 pg (ref 26.6–33.0)
MCHC: 33.5 g/dL (ref 31.5–35.7)
MCV: 88 fL (ref 79–97)
Monocytes Absolute: 0.3 10*3/uL (ref 0.1–0.9)
Monocytes: 4 %
Neutrophils Absolute: 6.2 10*3/uL (ref 1.4–7.0)
Neutrophils: 79 %
Platelets: 295 10*3/uL (ref 150–450)
RBC: 4.05 x10E6/uL (ref 3.77–5.28)
RDW: 12.3 % (ref 11.7–15.4)
RPR Ser Ql: NONREACTIVE
Rh Factor: POSITIVE
Rubella Antibodies, IGG: 2.21 index (ref >0.99)
WBC: 7.8 10*3/uL (ref 3.4–10.8)

## 2019-07-17 LAB — HCV INTERPRETATION

## 2019-07-19 ENCOUNTER — Other Ambulatory Visit: Payer: Self-pay | Admitting: Obstetrics & Gynecology

## 2019-07-22 ENCOUNTER — Telehealth: Payer: Self-pay | Admitting: Radiology

## 2019-07-22 ENCOUNTER — Other Ambulatory Visit: Payer: Self-pay | Admitting: Obstetrics & Gynecology

## 2019-07-22 ENCOUNTER — Encounter: Payer: Self-pay | Admitting: Radiology

## 2019-07-22 NOTE — Telephone Encounter (Signed)
Left message for patient with Panorama results without fetal sex

## 2019-07-23 ENCOUNTER — Encounter: Payer: Self-pay | Admitting: Radiology

## 2019-07-23 ENCOUNTER — Other Ambulatory Visit: Payer: Self-pay | Admitting: Family Medicine

## 2019-07-23 ENCOUNTER — Telehealth: Payer: Self-pay | Admitting: Obstetrics & Gynecology

## 2019-07-23 ENCOUNTER — Telehealth: Payer: Self-pay | Admitting: Radiology

## 2019-07-23 NOTE — Telephone Encounter (Signed)
Spoke with patient. Patient asking why Zofran refill was denied?   Advised per previous telephone encounter dated 06/27/19, you were scheduled to see OB on 07/16/19. Patient confirmed she did see OB on 07/16/19. Patient reports a new Rx was sent in for nausea, but is too expensive, requesting a refill of Zofran.   Advised patient OB is now managing her care going forward through pregnancy, she will need to f/u with OB for alternative Rx or refills. Patient verbalizes understanding and will f/u with OB. Patient thankful for call.   Routing to provider for final review. Patient is agreeable to disposition. Will close encounter.

## 2019-07-23 NOTE — Telephone Encounter (Signed)
Patient informed of [employer] genetic carrier screening.

## 2019-07-23 NOTE — Telephone Encounter (Signed)
Patient's prescription for Zofran was denied by Dr.Miller. Patient does not understand why?

## 2019-07-24 ENCOUNTER — Other Ambulatory Visit: Payer: Self-pay | Admitting: Family Medicine

## 2019-07-24 DIAGNOSIS — O219 Vomiting of pregnancy, unspecified: Secondary | ICD-10-CM

## 2019-07-24 MED ORDER — ONDANSETRON 4 MG PO TBDP: 4.0000 mg | ORAL_TABLET | Freq: Three times a day (TID) | ORAL | 0 refills | Status: DC | PRN

## 2019-08-05 ENCOUNTER — Other Ambulatory Visit: Payer: Self-pay | Admitting: Family Medicine

## 2019-08-05 DIAGNOSIS — O219 Vomiting of pregnancy, unspecified: Secondary | ICD-10-CM

## 2019-08-20 ENCOUNTER — Ambulatory Visit (INDEPENDENT_AMBULATORY_CARE_PROVIDER_SITE_OTHER): Payer: No Typology Code available for payment source | Admitting: Family Medicine

## 2019-08-20 ENCOUNTER — Other Ambulatory Visit: Payer: Self-pay

## 2019-08-20 ENCOUNTER — Other Ambulatory Visit: Payer: Self-pay | Admitting: Family Medicine

## 2019-08-20 VITALS — BP 102/69 | HR 109 | Wt 127.0 lb

## 2019-08-20 DIAGNOSIS — O219 Vomiting of pregnancy, unspecified: Secondary | ICD-10-CM

## 2019-08-20 DIAGNOSIS — Z3480 Encounter for supervision of other normal pregnancy, unspecified trimester: Secondary | ICD-10-CM

## 2019-08-20 DIAGNOSIS — Z3A16 16 weeks gestation of pregnancy: Secondary | ICD-10-CM

## 2019-08-20 NOTE — Progress Notes (Signed)
    PRENATAL VISIT NOTE  Subjective:  Courtney Barnett is a 29 y.o. G3P1011 at [redacted]w[redacted]d being seen today for ongoing prenatal care.  She is currently monitored for the following issues for this low-risk pregnancy and has Vasovagal syndrome; Bouchard's node; GAD (generalized anxiety disorder); Supervision of other normal pregnancy, antepartum; Nausea and vomiting of pregnancy, antepartum; and Rosacea on their problem list.  Patient reports nausea and vomiting.  Contractions: Not present. Vag. Bleeding: None.   . Denies leaking of fluid.   The following portions of the patient's history were reviewed and updated as appropriate: allergies, current medications, past family history, past medical history, past social history, past surgical history and problem list.   Objective:   Vitals:   08/20/19 0930  BP: 102/69  Pulse: (!) 109  Weight: 127 lb (57.6 kg)    Fetal Status: Fetal Heart Rate (bpm): 152         General:  Alert, oriented and cooperative. Patient is in no acute distress.  Skin: Skin is warm and dry. No rash noted.   Cardiovascular: Normal heart rate noted  Respiratory: Normal respiratory effort, no problems with respiration noted  Abdomen: Soft, gravid, appropriate for gestational age.  Pain/Pressure: Absent     Pelvic: Cervical exam deferred        Extremities: Normal range of motion.  Edema: None  Mental Status: Normal mood and affect. Normal behavior. Normal judgment and thought content.   Assessment and Plan:  Pregnancy: G3P1011 at [redacted]w[redacted]d 1. Nausea and vomiting of pregnancy, antepartum Try to wean down her Zofran  2. Supervision of other normal pregnancy, antepartum AFP today Has anatomy u/s scheduled. Normal genetic testing reviewed. - AFP, Serum, Open Spina Bifida  Preterm labor symptoms and general obstetric precautions including but not limited to vaginal bleeding, contractions, leaking of fluid and fetal movement were reviewed in detail with the  patient. Please refer to After Visit Summary for other counseling recommendations.   Return in 4 weeks (on 09/17/2019) for virtual.  Future Appointments  Date Time Provider Department Center  09/10/2019  9:45 AM [facility]  09/17/2019  9:30 AM Ludlow Bing, MD [facility]    Reva Bores, MD

## 2019-08-20 NOTE — Patient Instructions (Signed)

## 2019-08-20 NOTE — Progress Notes (Signed)
Discuss taking zofran

## 2019-08-22 LAB — AFP, SERUM, OPEN SPINA BIFIDA
AFP MoM: 0.69
AFP Value: 27.9 ng/mL
Gest. Age on Collection Date: 16.4 weeks
Maternal Age At EDD: 28.5 yr
OSBR Risk 1 IN: 10000
Test Results:: NEGATIVE
Weight: 127 [lb_av]

## 2019-09-03 ENCOUNTER — Other Ambulatory Visit: Payer: Self-pay | Admitting: Family Medicine

## 2019-09-03 DIAGNOSIS — O219 Vomiting of pregnancy, unspecified: Secondary | ICD-10-CM

## 2019-09-10 ENCOUNTER — Ambulatory Visit: Payer: No Typology Code available for payment source

## 2019-09-12 ENCOUNTER — Ambulatory Visit: Payer: No Typology Code available for payment source | Attending: Family Medicine

## 2019-09-12 ENCOUNTER — Other Ambulatory Visit: Payer: Self-pay

## 2019-09-12 DIAGNOSIS — Z3A19 19 weeks gestation of pregnancy: Secondary | ICD-10-CM

## 2019-09-12 DIAGNOSIS — Z3480 Encounter for supervision of other normal pregnancy, unspecified trimester: Secondary | ICD-10-CM | POA: Insufficient documentation

## 2019-09-12 DIAGNOSIS — Z363 Encounter for antenatal screening for malformations: Secondary | ICD-10-CM | POA: Diagnosis not present

## 2019-09-17 ENCOUNTER — Other Ambulatory Visit: Payer: Self-pay

## 2019-09-17 ENCOUNTER — Telehealth (INDEPENDENT_AMBULATORY_CARE_PROVIDER_SITE_OTHER): Payer: No Typology Code available for payment source | Admitting: Obstetrics and Gynecology

## 2019-09-17 VITALS — BP 106/69 | HR 83

## 2019-09-17 DIAGNOSIS — Z3481 Encounter for supervision of other normal pregnancy, first trimester: Secondary | ICD-10-CM

## 2019-09-17 DIAGNOSIS — Z3A20 20 weeks gestation of pregnancy: Secondary | ICD-10-CM

## 2019-09-17 DIAGNOSIS — Z3480 Encounter for supervision of other normal pregnancy, unspecified trimester: Secondary | ICD-10-CM

## 2019-09-17 NOTE — Progress Notes (Addendum)
     OBSTETRICS PRENATAL VIRTUAL VISIT ENCOUNTER NOTE  Provider location: Center for [facility]   I connected with [name] on  09/17/19 at  9:30 AM EDT by MyChart Video Encounter at home and verified that I am speaking with the correct person using two identifiers.   I discussed the limitations, risks, security and privacy concerns of performing an evaluation and management service virtually and the availability of in person appointments. I also discussed with the patient that there may be a patient responsible charge related to this service. The patient expressed understanding and agreed to proceed. Subjective:  Courtney Barnett is a 29 y.o. G3P1011 at [redacted]w[redacted]d being followed for ongoing prenatal care.  She is currently monitored for the following issues for this low-risk pregnancy and has Vasovagal syndrome; Bouchard's node; GAD (generalized anxiety disorder); Supervision of other normal pregnancy, antepartum; Nausea and vomiting of pregnancy, antepartum; and Rosacea on their problem list.  Patient reports no complaints. Reports fetal movement. Denies any contractions, bleeding or leaking of fluid.   The following portions of the patient's history were reviewed and updated as appropriate: allergies, current medications, past family history, past medical history, past social history, past surgical history and problem list.   Objective:   Vitals:   09/17/19 0930  BP: 106/69  Pulse: 83    Fetal Status:     Movement: Present   . No vaginal bleeding. No leakage of fluid  General:  Alert, oriented and cooperative. Patient is in no acute distress.  Respiratory: Normal respiratory effort, no problems with respiration noted  Mental Status: Normal mood and affect. Normal behavior. Normal judgment and thought content.  Rest of physical exam deferred due to type of encounter  Imaging: No results found.  Assessment and Plan:  Pregnancy: [id number] at  [redacted]w[redacted]d 1. Supervision of other normal pregnancy, antepartum Routine care. Normal anatomy u/s  Term labor symptoms and general obstetric precautions including but not limited to vaginal bleeding, contractions, leaking of fluid and fetal movement were reviewed in detail with the patient. I discussed the assessment and treatment plan with the patient. The patient was provided an opportunity to ask questions and all were answered. The patient agreed with the plan and demonstrated an understanding of the instructions. The patient was advised to call back or seek an in-person office evaluation/go to MAU at [facility] for any urgent or concerning symptoms. Please refer to After Visit Summary for other counseling recommendations.   I provided 7 minutes of face-to-face time during this encounter.  No follow-ups on file.  Henderson Bing, MD Center for [employer], [facility]

## 2019-10-15 ENCOUNTER — Other Ambulatory Visit: Payer: Self-pay

## 2019-10-15 ENCOUNTER — Encounter: Payer: Self-pay | Admitting: Obstetrics and Gynecology

## 2019-10-15 ENCOUNTER — Telehealth (INDEPENDENT_AMBULATORY_CARE_PROVIDER_SITE_OTHER): Payer: No Typology Code available for payment source | Admitting: Obstetrics and Gynecology

## 2019-10-15 VITALS — BP 121/74 | HR 102

## 2019-10-15 DIAGNOSIS — Z3480 Encounter for supervision of other normal pregnancy, unspecified trimester: Secondary | ICD-10-CM | POA: Diagnosis not present

## 2019-10-15 NOTE — Progress Notes (Signed)
° °  OBSTETRICS PRENATAL VIRTUAL VISIT ENCOUNTER NOTE  Provider location: Center for [facility]   I connected with [name] on 10/15/19 at  9:30 AM EDT by MyChart Video Encounter at home and verified that I am speaking with the correct person using two identifiers.   I discussed the limitations, risks, security and privacy concerns of performing an evaluation and management service virtually and the availability of in person appointments. I also discussed with the patient that there may be a patient responsible charge related to this service. The patient expressed understanding and agreed to proceed. Subjective:  Courtney Barnett is a 29 y.o. G3P1011 at [redacted]w[redacted]d being seen today for ongoing prenatal care.  She is currently monitored for the following issues for this low-risk pregnancy and has Vasovagal syndrome; Bouchard's node; GAD (generalized anxiety disorder); Supervision of other normal pregnancy, antepartum; Nausea and vomiting of pregnancy, antepartum; and Rosacea on their problem list.  Patient reports no complaints.  Contractions: Not present. Vag. Bleeding: None.  Movement: Present. Denies any leaking of fluid.   The following portions of the patient's history were reviewed and updated as appropriate: allergies, current medications, past family history, past medical history, past social history, past surgical history and problem list.   Objective:   Vitals:   10/15/19 0907  BP: 121/74  Pulse: (!) 102    Fetal Status:     Movement: Present     General:  Alert, oriented and cooperative. Patient is in no acute distress.  Respiratory: Normal respiratory effort, no problems with respiration noted  Mental Status: Normal mood and affect. Normal behavior. Normal judgment and thought content.  Rest of physical exam deferred due to type of encounter  Imaging: No results found.  Assessment and Plan:  Pregnancy: G3P1011 at [redacted]w[redacted]d 1. Supervision of  other normal pregnancy, antepartum Patient is doing well without complaints Third trimester labs with glucola next visit Patient scheduled to obtain first covid vaccine tomorrow  Preterm labor symptoms and general obstetric precautions including but not limited to vaginal bleeding, contractions, leaking of fluid and fetal movement were reviewed in detail with the patient. I discussed the assessment and treatment plan with the patient. The patient was provided an opportunity to ask questions and all were answered. The patient agreed with the plan and demonstrated an understanding of the instructions. The patient was advised to call back or seek an in-person office evaluation/go to [facility] at [facility] for any urgent or concerning symptoms. Please refer to After Visit Summary for other counseling recommendations.   I provided 7 minutes of face-to-face time during this encounter.  Return in about 4 weeks (around 11/12/2019) for in person, ROB, Low risk, 2 hr glucola next visit.  Future Appointments  Date Time Provider Department Center  10/15/2019  9:30 AM Caniyah Murley, Gigi Gin, MD [facility]    Catalina Antigua, MD Center for [facility], [facility]

## 2019-11-13 ENCOUNTER — Encounter: Payer: Self-pay | Admitting: Advanced Practice Midwife

## 2019-11-13 ENCOUNTER — Ambulatory Visit (INDEPENDENT_AMBULATORY_CARE_PROVIDER_SITE_OTHER): Payer: No Typology Code available for payment source | Admitting: Advanced Practice Midwife

## 2019-11-13 VITALS — BP 100/67 | HR 88 | Wt 142.0 lb

## 2019-11-13 DIAGNOSIS — F411 Generalized anxiety disorder: Secondary | ICD-10-CM

## 2019-11-13 DIAGNOSIS — Z3A28 28 weeks gestation of pregnancy: Secondary | ICD-10-CM

## 2019-11-13 DIAGNOSIS — Z3480 Encounter for supervision of other normal pregnancy, unspecified trimester: Secondary | ICD-10-CM

## 2019-11-13 NOTE — Progress Notes (Signed)
   PRENATAL VISIT NOTE  Subjective:  Courtney Barnett is a 29 y.o. G3P1011 at [redacted]w[redacted]d being seen today for ongoing prenatal care.  She is currently monitored for the following issues for this low-risk pregnancy and has Vasovagal syndrome; Bouchard's node; Supervision of other normal pregnancy, antepartum; Nausea and vomiting of pregnancy, antepartum; and Rosacea on their problem list.  Patient reports no complaints.  Contractions: Not present. Vag. Bleeding: None.  Movement: Present. Denies leaking of fluid.   The following portions of the patient's history were reviewed and updated as appropriate: allergies, current medications, past family history, past medical history, past social history, past surgical history and problem list. Problem list updated.  Objective:   Vitals:   11/13/19 0843  BP: 100/67  Pulse: 88  Weight: 142 lb (64.4 kg)    Fetal Status: Fetal Heart Rate (bpm): 149 Fundal Height: 28 cm Movement: Present     General:  Alert, oriented and cooperative. Patient is in no acute distress.  Skin: Skin is warm and dry. No rash noted.   Cardiovascular: Normal heart rate noted  Respiratory: Normal respiratory effort, no problems with respiration noted  Abdomen: Soft, gravid, appropriate for gestational age.  Pain/Pressure: Absent     Pelvic: Cervical exam deferred        Extremities: Normal range of motion.  Edema: None  Mental Status: Normal mood and affect. Normal behavior. Normal judgment and thought content.   Assessment and Plan:  Pregnancy: G3P1011 at [redacted]w[redacted]d  1. Supervision of other normal pregnancy, antepartum - LOB, routine care - Kick counts, interventions for low kick number, indications for NST in office or MAU eval  2. [redacted] weeks gestation of pregnancy - TDAP and Flu next visit - Glucose Tolerance, 2 Hours w/1 Hour - CBC - RPR - HIV Antibody (routine testing w rflx)  3. Generalized anxiety disorder - Specific to work stress, no longer working there, not  on medications, does not feel it is a problem - Mentioned availability of BHC PRN  Preterm labor symptoms and general obstetric precautions including but not limited to vaginal bleeding, contractions, leaking of fluid and fetal movement were reviewed in detail with the patient. Please refer to After Visit Summary for other counseling recommendations.  Return in about 2 weeks (around 11/27/2019).  Future Appointments  Date Time Provider Department Center  11/27/2019  9:30 AM Calvert Cantor, CNM [facility]    Calvert Cantor, [place]

## 2019-11-13 NOTE — Patient Instructions (Addendum)
Glucose Tolerance Test During Pregnancy Why am I having this test? The glucose tolerance test (GTT) is done to check how your body processes sugar (glucose). This is one of several tests used to diagnose diabetes that develops during pregnancy (gestational diabetes mellitus). Gestational diabetes is a temporary form of diabetes that some women develop during pregnancy. It usually occurs during the second trimester of pregnancy and goes away after delivery. Testing (screening) for gestational diabetes usually occurs between 24 and 28 weeks of pregnancy. You may have the GTT test after having a 1-hour glucose screening test if the results from that test indicate that you may have gestational diabetes. You may also have this test if:  You have a history of gestational diabetes.  You have a history of giving birth to very large babies or have experienced repeated fetal loss (stillbirth).  You have signs and symptoms of diabetes, such as: ? Changes in your vision. ? Tingling or numbness in your hands or feet. ? Changes in hunger, thirst, and urination that are not otherwise explained by your pregnancy. What is being tested? This test measures the amount of glucose in your blood at different times during a period of 3 hours. This indicates how well your body is able to process glucose. What kind of sample is taken?  Blood samples are required for this test. They are usually collected by inserting a needle into a blood vessel. How do I prepare for this test?  For 3 days before your test, eat normally. Have plenty of carbohydrate-rich foods.  Follow instructions from your health care provider about: ? Eating or drinking restrictions on the day of the test. You may be asked to not eat or drink anything other than water (fast) starting 8-10 hours before the test. ? Changing or stopping your regular medicines. Some medicines may interfere with this test. Tell a health care provider about:  All  medicines you are taking, including vitamins, herbs, eye drops, creams, and over-the-counter medicines.  Any blood disorders you have.  Any surgeries you have had.  Any medical conditions you have. What happens during the test? First, your blood glucose will be measured. This is referred to as your fasting blood glucose, since you fasted before the test. Then, you will drink a glucose solution that contains a certain amount of glucose. Your blood glucose will be measured again 1, 2, and 3 hours after drinking the solution. This test takes about 3 hours to complete. You will need to stay at the testing location during this time. During the testing period:  Do not eat or drink anything other than the glucose solution.  Do not exercise.  Do not use any products that contain nicotine or tobacco, such as cigarettes and e-cigarettes. If you need help stopping, ask your health care provider. The testing procedure may vary among health care providers and hospitals. How are the results reported? Your results will be reported as milligrams of glucose per deciliter of blood (mg/dL) or millimoles per liter (mmol/L). Your health care provider will compare your results to normal ranges that were established after testing a large group of people (reference ranges). Reference ranges may vary among labs and hospitals. For this test, common reference ranges are:  Fasting: less than 95-105 mg/dL (5.3-5.8 mmol/L).  1 hour after drinking glucose: less than 180-190 mg/dL (10.0-10.5 mmol/L).  2 hours after drinking glucose: less than 155-165 mg/dL (8.6-9.2 mmol/L).  3 hours after drinking glucose: 140-145 mg/dL (7.8-8.1 mmol/L). What do the   results mean? Results within reference ranges are considered normal, meaning that your glucose levels are well-controlled. If two or more of your blood glucose levels are high, you may be diagnosed with gestational diabetes. If only one level is high, your health care  provider may suggest repeat testing or other tests to confirm a diagnosis. Talk with your health care provider about what your results mean. Questions to ask your health care provider Ask your health care provider, or the department that is doing the test:  When will my results be ready?  How will I get my results?  What are my treatment options?  What other tests do I need?  What are my next steps? Summary  The glucose tolerance test (GTT) is one of several tests used to diagnose diabetes that develops during pregnancy (gestational diabetes mellitus). Gestational diabetes is a temporary form of diabetes that some women develop during pregnancy.  You may have the GTT test after having a 1-hour glucose screening test if the results from that test indicate that you may have gestational diabetes. You may also have this test if you have any symptoms or risk factors for gestational diabetes.  Talk with your health care provider about what your results mean. This information is not intended to replace advice given to you by your health care provider. Make sure you discuss any questions you have with your health care provider. Document Revised: 05/24/2018 Document Reviewed: 09/12/2016 Elsevier Patient Education  2020 [employer]   Fetal Movement Counts Patient Name: ________________________________________________ Patient Due Date: ____________________ What is a fetal movement count?  A fetal movement count is the number of times that you feel your baby move during a certain amount of time. This may also be called a fetal kick count. A fetal movement count is recommended for every pregnant woman. You may be asked to start counting fetal movements as early as week 28 of your pregnancy. Pay attention to when your baby is most active. You may notice your baby's sleep and wake cycles. You may also notice things that make your baby move more. You should do a fetal movement count:  When your  baby is normally most active.  At the same time each day. A good time to count movements is while you are resting, after having something to eat and drink. How do I count fetal movements? 1. Find a quiet, comfortable area. Sit, or lie down on your side. 2. Write down the date, the start time and stop time, and the number of movements that you felt between those two times. Take this information with you to your health care visits. 3. Write down your start time when you feel the first movement. 4. Count kicks, flutters, swishes, rolls, and jabs. You should feel at least 10 movements. 5. You may stop counting after you have felt 10 movements, or if you have been counting for 2 hours. Write down the stop time. 6. If you do not feel 10 movements in 2 hours, contact your health care provider for further instructions. Your health care provider may want to do additional tests to assess your baby's well-being. Contact a health care provider if:  You feel fewer than 10 movements in 2 hours.  Your baby is not moving like he or she usually does. Date: ____________ Start time: ____________ Stop time: ____________ Movements: ____________ Date: ____________ Start time: ____________ Stop time: ____________ Movements: ____________ Date: ____________ Start time: ____________ Stop time: ____________ Movements: ____________ Date: ____________ Start  time: ____________ Stop time: ____________ Movements: ____________ Date: ____________ Start time: ____________ Stop time: ____________ Movements: ____________ Date: ____________ Start time: ____________ Stop time: ____________ Movements: ____________ Date: ____________ Start time: ____________ Stop time: ____________ Movements: ____________ Date: ____________ Start time: ____________ Stop time: ____________ Movements: ____________ Date: ____________ Start time: ____________ Stop time: ____________ Movements: ____________ This information is not intended to replace  advice given to you by your health care provider. Make sure you discuss any questions you have with your health care provider. Document Revised: 09/20/2018 Document Reviewed: 09/20/2018 Elsevier Patient Education  2020 [employer].

## 2019-11-14 LAB — CBC
Hematocrit: 35.1 % (ref 34.0–46.6)
Hemoglobin: 11.6 g/dL (ref 11.1–15.9)
MCH: 30.1 pg (ref 26.6–33.0)
MCHC: 33 g/dL (ref 31.5–35.7)
MCV: 91 fL (ref 79–97)
Platelets: 290 10*3/uL (ref 150–450)
RBC: 3.86 x10E6/uL (ref 3.77–5.28)
RDW: 11.8 % (ref 11.7–15.4)
WBC: 7.8 10*3/uL (ref 3.4–10.8)

## 2019-11-14 LAB — GLUCOSE TOLERANCE, 2 HOURS W/ 1HR
Glucose, 1 hour: 102 mg/dL (ref 65–179)
Glucose, 2 hour: 111 mg/dL (ref 65–152)
Glucose, Fasting: 75 mg/dL (ref 65–91)

## 2019-11-14 LAB — HIV ANTIBODY (ROUTINE TESTING W REFLEX): HIV Screen 4th Generation wRfx: NONREACTIVE

## 2019-11-14 LAB — RPR: RPR Ser Ql: NONREACTIVE

## 2019-11-27 ENCOUNTER — Ambulatory Visit (INDEPENDENT_AMBULATORY_CARE_PROVIDER_SITE_OTHER): Payer: No Typology Code available for payment source | Admitting: Advanced Practice Midwife

## 2019-11-27 ENCOUNTER — Other Ambulatory Visit: Payer: Self-pay

## 2019-11-27 VITALS — BP 102/70 | HR 99 | Wt 145.0 lb

## 2019-11-27 DIAGNOSIS — Z23 Encounter for immunization: Secondary | ICD-10-CM | POA: Diagnosis not present

## 2019-11-27 DIAGNOSIS — Z3A30 30 weeks gestation of pregnancy: Secondary | ICD-10-CM

## 2019-11-27 DIAGNOSIS — Z3480 Encounter for supervision of other normal pregnancy, unspecified trimester: Secondary | ICD-10-CM

## 2019-11-27 NOTE — Patient Instructions (Signed)

## 2019-11-27 NOTE — Progress Notes (Signed)
   PRENATAL VISIT NOTE  Subjective:  Rosio Weiss is a 29 y.o. G3P1011 at [redacted]w[redacted]d being seen today for ongoing prenatal care.  She is currently monitored for the following issues for this low-risk pregnancy and has Vasovagal syndrome; Bouchard's node; Supervision of other normal pregnancy, antepartum; Nausea and vomiting of pregnancy, antepartum; and Rosacea on their problem list.  Patient reports no complaints. Previously had pelvic pressure but is wearing maternity belt and getting relief.  Contractions: Irregular. Vag. Bleeding: None.  Movement: Present. Denies leaking of fluid.   The following portions of the patient's history were reviewed and updated as appropriate: allergies, current medications, past family history, past medical history, past social history, past surgical history and problem list. Problem list updated.  Objective:   Vitals:   11/27/19 0928  BP: 102/70  Pulse: 99  Weight: 145 lb (65.8 kg)    Fetal Status: Fetal Heart Rate (bpm): 135 Fundal Height: 30 cm Movement: Present     General:  Alert, oriented and cooperative. Patient is in no acute distress.  Skin: Skin is warm and dry. No rash noted.   Cardiovascular: Normal heart rate noted  Respiratory: Normal respiratory effort, no problems with respiration noted  Abdomen: Soft, gravid, appropriate for gestational age.  Pain/Pressure: Present     Pelvic: Cervical exam deferred        Extremities: Normal range of motion.  Edema: None  Mental Status: Normal mood and affect. Normal behavior. Normal judgment and thought content.   Assessment and Plan:  Pregnancy: G3P1011 at [redacted]w[redacted]d  1. Supervision of other normal pregnancy, antepartum - routine care - Reviewed kick counts, interventions at previous visit  2. [redacted] weeks gestation of pregnancy  - Tdap vaccine greater than or equal to 7yo IM  Preterm labor symptoms and general obstetric precautions including but not limited to vaginal bleeding, contractions,  leaking of fluid and fetal movement were reviewed in detail with the patient. Please refer to After Visit Summary for other counseling recommendations.  Return in about 2 weeks (around 12/11/2019) for Virtual please.  Future Appointments  Date Time Provider Department Center  12/11/2019  9:30 AM Calvert Cantor, CNM [facility]    Calvert Cantor, [place]

## 2019-12-11 ENCOUNTER — Other Ambulatory Visit: Payer: Self-pay

## 2019-12-11 ENCOUNTER — Telehealth (INDEPENDENT_AMBULATORY_CARE_PROVIDER_SITE_OTHER): Payer: No Typology Code available for payment source | Admitting: Advanced Practice Midwife

## 2019-12-11 VITALS — BP 96/71 | Wt 146.0 lb

## 2019-12-11 DIAGNOSIS — Z3480 Encounter for supervision of other normal pregnancy, unspecified trimester: Secondary | ICD-10-CM

## 2019-12-11 DIAGNOSIS — O479 False labor, unspecified: Secondary | ICD-10-CM

## 2019-12-11 DIAGNOSIS — R55 Syncope and collapse: Secondary | ICD-10-CM

## 2019-12-11 DIAGNOSIS — Z3A32 32 weeks gestation of pregnancy: Secondary | ICD-10-CM

## 2019-12-11 DIAGNOSIS — O4703 False labor before 37 completed weeks of gestation, third trimester: Secondary | ICD-10-CM

## 2019-12-11 DIAGNOSIS — O219 Vomiting of pregnancy, unspecified: Secondary | ICD-10-CM

## 2019-12-11 DIAGNOSIS — R110 Nausea: Secondary | ICD-10-CM

## 2019-12-11 DIAGNOSIS — O26893 Other specified pregnancy related conditions, third trimester: Secondary | ICD-10-CM

## 2019-12-11 NOTE — Progress Notes (Signed)
I connected with  Courtney Barnett on 12/11/19 at  9:30 AM EDT by telephone and verified that I am speaking with the correct person using two identifiers.   I discussed the limitations, risks, security and privacy concerns of performing an evaluation and management service by telephone and the availability of in person appointments. I also discussed with the patient that there may be a patient responsible charge related to this service. The patient expressed understanding and agreed to proceed.  Scheryl Marten, RN 12/11/2019  9:31 AM

## 2019-12-11 NOTE — Progress Notes (Signed)
   OBSTETRICS PRENATAL VIRTUAL VISIT ENCOUNTER NOTE  Provider location: [facility]   I connected with Courtney Barnett on 12/11/19 at  9:30 AM EDT by MyChart Video Encounter at home and verified that I am speaking with the correct person using two identifiers.   I discussed the limitations, risks, security and privacy concerns of performing an evaluation and management service virtually and the availability of in person appointments. I also discussed with the patient that there may be a patient responsible charge related to this service. The patient expressed understanding and agreed to proceed. Subjective:  Courtney Barnett is a 29 y.o. G3P1011 at [redacted]w[redacted]d being seen today for ongoing prenatal care.  She is currently monitored for the following issues for this low-risk pregnancy and has Vasovagal syndrome; Bouchard's node; Supervision of other normal pregnancy, antepartum; Nausea and vomiting of pregnancy, antepartum; and Rosacea on their problem list.  Patient reports irregular contractions after a significant amount of walking on Sunday 12/08/2019. She reports mild ctx q 30 minutes x 2 hours. They resolved with rest and hydration.   Contractions: Irregular. Vag. Bleeding: None.  Movement: Present. Denies any leaking of fluid.   The following portions of the patient's history were reviewed and updated as appropriate: allergies, current medications, past family history, past medical history, past social history, past surgical history and problem list.   Objective:   Vitals:   12/11/19 0931  BP: 96/71  Weight: 146 lb (66.2 kg)    Fetal Status:     Movement: Present     General:  Alert, oriented and cooperative. Patient is in no acute distress.  Respiratory: Normal respiratory effort, no problems with respiration noted  Mental Status: Normal mood and affect. Normal behavior. Normal judgment and thought content.  Rest of physical exam deferred due to  type of encounter  Imaging: No results found.  Assessment and Plan:  Pregnancy: G3P1011 at [redacted]w[redacted]d 1. Supervision of other normal pregnancy, antepartum - Routine care - Prefers Mirena for pp contraception  2. [redacted] weeks gestation of pregnancy   3. Braxton Hick's contraction - Discussed BH r/t physical exertion vs preterm labor - Present to MAU if > 6 in one hour or if no relief after rest and hydration  Preterm labor symptoms and general obstetric precautions including but not limited to vaginal bleeding, contractions, leaking of fluid and fetal movement were reviewed in detail with the patient. I discussed the assessment and treatment plan with the patient. The patient was provided an opportunity to ask questions and all were answered. The patient agreed with the plan and demonstrated an understanding of the instructions. The patient was advised to call back or seek an in-person office evaluation/go to [facility] at [facility] for any urgent or concerning symptoms. Please refer to After Visit Summary for other counseling recommendations.   I provided 8 minutes of face-to-face time during this encounter.   Future Appointments  Date Time Provider Department Center  12/25/2019 10:00 AM Calvert Cantor, [place] [facility]  01/08/2020 10:30 AM Calvert Cantor, CNM [facility]    Calvert Cantor, CNM [facility] [employer], [facility]

## 2019-12-25 ENCOUNTER — Ambulatory Visit (INDEPENDENT_AMBULATORY_CARE_PROVIDER_SITE_OTHER): Payer: No Typology Code available for payment source | Admitting: Advanced Practice Midwife

## 2019-12-25 ENCOUNTER — Other Ambulatory Visit: Payer: Self-pay

## 2019-12-25 VITALS — BP 105/70 | HR 92 | Wt 150.6 lb

## 2019-12-25 DIAGNOSIS — J302 Other seasonal allergic rhinitis: Secondary | ICD-10-CM

## 2019-12-25 DIAGNOSIS — Z3480 Encounter for supervision of other normal pregnancy, unspecified trimester: Secondary | ICD-10-CM

## 2019-12-25 DIAGNOSIS — Z3A34 34 weeks gestation of pregnancy: Secondary | ICD-10-CM

## 2019-12-25 MED ORDER — CETIRIZINE HCL 10 MG PO TABS: 10.0000 mg | ORAL_TABLET | Freq: Every day | ORAL | 0 refills | Status: DC

## 2019-12-25 NOTE — Patient Instructions (Addendum)
Sinusitis, Adult Sinusitis is soreness and swelling (inflammation) of your sinuses. Sinuses are hollow spaces in the bones around your face. They are located:  Around your eyes.  In the middle of your forehead.  Behind your nose.  In your cheekbones. Your sinuses and nasal passages are lined with a fluid called mucus. Mucus drains out of your sinuses. Swelling can trap mucus in your sinuses. This lets germs (bacteria, virus, or fungus) grow, which leads to infection. Most of the time, this condition is caused by a virus. What are the causes? This condition is caused by:  Allergies.  Asthma.  Germs.  Things that block your nose or sinuses.  Growths in the nose (nasal polyps).  Chemicals or irritants in the air.  Fungus (rare). What increases the risk? You are more likely to develop this condition if:  You have a weak body defense system (immune system).  You do a lot of swimming or diving.  You use nasal sprays too much.  You smoke. What are the signs or symptoms? The main symptoms of this condition are pain and a feeling of pressure around the sinuses. Other symptoms include:  Stuffy nose (congestion).  Runny nose (drainage).  Swelling and warmth in the sinuses.  Headache.  Toothache.  A cough that may get worse at night.  Mucus that collects in the throat or the back of the nose (postnasal drip).  Being unable to smell and taste.  Being very tired (fatigue).  A fever.  Sore throat.  Bad breath. How is this diagnosed? This condition is diagnosed based on:  Your symptoms.  Your medical history.  A physical exam.  Tests to find out if your condition is short-term (acute) or long-term (chronic). Your doctor may: ? Check your nose for growths (polyps). ? Check your sinuses using a tool that has a light (endoscope). ? Check for allergies or germs. ? Do imaging tests, such as an MRI or CT scan. How is this treated? Treatment for this condition  depends on the cause and whether it is short-term or long-term.  If caused by a virus, your symptoms should go away on their own within 10 days. You may be given medicines to relieve symptoms. They include: ? Medicines that shrink swollen tissue in the nose. ? Medicines that treat allergies (antihistamines). ? A spray that treats swelling of the nostrils. ? Rinses that help get rid of thick mucus in your nose (nasal saline washes).  If caused by bacteria, your doctor may wait to see if you will get better without treatment. You may be given antibiotic medicine if you have: ? A very bad infection. ? A weak body defense system.  If caused by growths in the nose, you may need to have surgery. Follow these instructions at home: Medicines  Take, use, or apply over-the-counter and prescription medicines only as told by your doctor. These may include nasal sprays.  If you were prescribed an antibiotic medicine, take it as told by your doctor. Do not stop taking the antibiotic even if you start to feel better. Hydrate and humidify   Drink enough water to keep your pee (urine) pale yellow.  Use a cool mist humidifier to keep the humidity level in your home above 50%.  Breathe in steam for 10-15 minutes, 3-4 times a day, or as told by your doctor. You can do this in the bathroom while a hot shower is running.  Try not to spend time in cool or dry air.   Rest  Rest as much as you can.  Sleep with your head raised (elevated).  Make sure you get enough sleep each night. General instructions   Put a warm, moist washcloth on your face 3-4 times a day, or as often as told by your doctor. This will help with discomfort.  Wash your hands often with soap and water. If there is no soap and water, use hand sanitizer.  Do not smoke. Avoid being around people who are smoking (secondhand smoke).  Keep all follow-up visits as told by your doctor. This is important. Contact a doctor if:  You  have a fever.  Your symptoms get worse.  Your symptoms do not get better within 10 days. Get help right away if:  You have a very bad headache.  You cannot stop throwing up (vomiting).  You have very bad pain or swelling around your face or eyes.  You have trouble seeing.  You feel confused.  Your neck is stiff.  You have trouble breathing. Summary  Sinusitis is swelling of your sinuses. Sinuses are hollow spaces in the bones around your face.  This condition is caused by tissues in your nose that become inflamed or swollen. This traps germs. These can lead to infection.  If you were prescribed an antibiotic medicine, take it as told by your doctor. Do not stop taking it even if you start to feel better.  Keep all follow-up visits as told by your doctor. This is important. This information is not intended to replace advice given to you by your health care provider. Make sure you discuss any questions you have with your health care provider. Document Revised: 07/03/2017 Document Reviewed: 07/03/2017 Elsevier Patient Education  2020 [employer].  Safe Medications in Pregnancy   Acne: Benzoyl Peroxide Salicylic Acid  Backache/Headache: Tylenol: 2 regular strength every 4 hours OR              2 Extra strength every 6 hours  Colds/Coughs/Allergies: Benadryl (alcohol free) 25 mg every 6 hours as needed Breath right strips Claritin Cepacol throat lozenges Chloraseptic throat spray Cold-Eeze- up to three times per day Cough drops, alcohol free Flonase (by prescription only) Guaifenesin Mucinex Robitussin DM (plain only, alcohol free) Saline nasal spray/drops Sudafed (pseudoephedrine) & Actifed ** use only after [redacted] weeks gestation and if you do not have high blood pressure Tylenol Vicks Vaporub Zinc lozenges Zyrtec   Constipation: Colace Ducolax suppositories Fleet enema Glycerin suppositories Metamucil Milk of magnesia Miralax Senokot Smooth move  tea  Diarrhea: Kaopectate Imodium A-D  *NO pepto Bismol  Hemorrhoids: Anusol Anusol HC Preparation H Tucks  Indigestion: Tums Maalox Mylanta Zantac  Pepcid  Insomnia: Benadryl (alcohol free) 25mg  every 6 hours as needed Tylenol PM Unisom, no Gelcaps  Leg Cramps: Tums MagGel  Nausea/Vomiting:  Bonine Dramamine Emetrol Ginger extract Sea bands Meclizine  Nausea medication to take during pregnancy:  Unisom (doxylamine succinate 25 mg tablets) Take one tablet daily at bedtime. If symptoms are not adequately controlled, the dose can be increased to a maximum recommended dose of two tablets daily (1/2 tablet in the morning, 1/2 tablet mid-afternoon and one at bedtime). Vitamin B6 100mg  tablets. Take one tablet twice a day (up to 200 mg per day).  Skin Rashes: Aveeno products Benadryl cream or 25mg  every 6 hours as needed Calamine Lotion 1% cortisone cream  Yeast infection: Gyne-lotrimin 7 Monistat 7   **If taking multiple medications, please check labels to avoid duplicating the same active ingredients **take  medication as directed on the label ** Do not exceed 4000 mg of tylenol in 24 hours **Do not take medications that contain aspirin or ibuprofen   Group B Streptococcus Test During Pregnancy Why am I having this test? Routine testing, also called screening, for group B streptococcus (GBS) is recommended for all pregnant women between the 36th and 37th week of pregnancy. GBS is a type of bacteria that can be passed from mother to baby during childbirth. Screening will help guide whether or not you will need treatment during labor and delivery to prevent complications such as:  An infection in your uterus during labor.  An infection in your uterus after delivery.  A serious infection in your baby after delivery, such as pneumonia, meningitis, or sepsis. GBS screening is not often done before 36 weeks of pregnancy unless you go into labor  prematurely. What happens if I have group B streptococcus? If testing shows that you have GBS, your health care provider will recommend treatment with IV antibiotics during labor and delivery. This treatment significantly decreases the risk of complications for you and your baby. If you have a planned C-section and you have GBS, you may not need to be treated with antibiotics because GBS is usually passed to babies after labor starts and your water breaks. If you are in labor or your water breaks before your C-section, it is possible for GBS to get into your uterus and be passed to your baby, so you might need treatment. Is there a chance I may not need to be tested? You may not need to be tested for GBS if:  You have a urine test that shows GBS before 36 to 37 weeks.  You had a baby with GBS infection after a previous delivery. In these cases, you will automatically be treated for GBS during labor and delivery. What is being tested? This test is done to check if you have group B streptococcus in your vagina or rectum. What kind of sample is taken? To collect samples for this test, your health care provider will swab your vagina and rectum with a cotton swab. The sample is then sent to the lab to see if GBS is present. What happens during the test?   You will remove your clothing from the waist down.  You will lie down on an exam table in the same position as you would for a pelvic exam.  Your health care provider will swab your vagina and rectum to collect samples for a culture test.  You will be able to go home after the test and do all your usual activities. How are the results reported? The test results are reported as positive or negative. What do the results mean?  A positive test means you are at risk for passing GBS to your baby during labor and delivery. Your health care provider will recommend that you are treated with an IV antibiotic during labor and delivery.  A negative  test means you are at very low risk of passing GBS to your baby. There is still a low risk of passing GBS to your baby because sometimes test results may report that you do not have a condition when you do (false-negative result) or there is a chance that you may become infected with GBS after the test is done. You most likely will not need to be treated with an antibiotic during labor and delivery. Talk with your health care provider about what your results mean. Questions to  ask your health care provider Ask your health care provider, or the department that is doing the test:  When will my results be ready?  How will I get my results?  What are my treatment options? Summary  Routine testing (screening) for group B streptococcus (GBS) is recommended for all pregnant women between the 36th and 37th week of pregnancy.  GBS is a type of bacteria that can be passed from mother to baby during childbirth.  If testing shows that you have GBS, your health care provider will recommend that you are treated with IV antibiotics during labor and delivery. This treatment almost always prevents infection in newborns. This information is not intended to replace advice given to you by your health care provider. Make sure you discuss any questions you have with your health care provider. Document Revised: 05/24/2018 Document Reviewed: 02/28/2018 Elsevier Patient Education  2020 [employer].

## 2019-12-25 NOTE — Progress Notes (Signed)
   PRENATAL VISIT NOTE  Subjective:  Courtney Barnett is a 29 y.o. G3P1011 at [redacted]w[redacted]d being seen today for ongoing prenatal care.  She is currently monitored for the following issues for this low-risk pregnancy and has Vasovagal syndrome; Bouchard's node; Supervision of other normal pregnancy, antepartum; Nausea and vomiting of pregnancy, antepartum; and Rosacea on their problem list.  Patient reports no complaints.  Contractions: Irregular. Vag. Bleeding: None.  Movement: Present. Denies leaking of fluid.   The following portions of the patient's history were reviewed and updated as appropriate: allergies, current medications, past family history, past medical history, past social history, past surgical history and problem list. Problem list updated.  Objective:   Vitals:   12/25/19 0952  BP: 105/70  Pulse: 92  Weight: 150 lb 9.6 oz (68.3 kg)    Fetal Status: Fetal Heart Rate (bpm): 152 Fundal Height: 34 cm Movement: Present     General:  Alert, oriented and cooperative. Patient is in no acute distress.  Skin: Skin is warm and dry. No rash noted.   Cardiovascular: Normal heart rate noted  Respiratory: Normal respiratory effort, no problems with respiration noted  Abdomen: Soft, gravid, appropriate for gestational age.  Pain/Pressure: Present     Pelvic: Cervical exam deferred        Extremities: Normal range of motion.  Edema: None  Mental Status: Normal mood and affect. Normal behavior. Normal judgment and thought content.   Assessment and Plan:  Pregnancy: G3P1011 at [redacted]w[redacted]d  1. Supervision of other normal pregnancy, antepartum - LOB, routine care - Preemptive teaching GBS next visit  2. [redacted] weeks gestation of pregnancy   3. Seasonal allergies - Continue Neti pot, Flonase, consider reserving Mucinex for first thing in the morning - cetirizine (ZYRTEC ALLERGY) 10 MG tablet; Take 1 tablet (10 mg total) by mouth daily.  Dispense: 30 tablet; Refill: 0  Preterm labor symptoms  and general obstetric precautions including but not limited to vaginal bleeding, contractions, leaking of fluid and fetal movement were reviewed in detail with the patient. Please refer to After Visit Summary for other counseling recommendations.  Return in about 2 weeks (around 01/08/2020) for 36 week Swabs, with CNM.  Future Appointments  Date Time Provider Department Center  01/08/2020 10:30 AM Calvert Cantor, CNM [facility]    Calvert Cantor, [place]

## 2020-01-08 ENCOUNTER — Other Ambulatory Visit: Payer: Self-pay

## 2020-01-08 ENCOUNTER — Other Ambulatory Visit (HOSPITAL_COMMUNITY)
Admission: RE | Admit: 2020-01-08 | Discharge: 2020-01-08 | Disposition: A | Discharge status: Home / Self Care | Payer: No Typology Code available for payment source | Source: Ambulatory Visit | Attending: Advanced Practice Midwife | Admitting: Advanced Practice Midwife

## 2020-01-08 ENCOUNTER — Ambulatory Visit (INDEPENDENT_AMBULATORY_CARE_PROVIDER_SITE_OTHER): Payer: No Typology Code available for payment source | Admitting: Advanced Practice Midwife

## 2020-01-08 VITALS — BP 112/75 | HR 94 | Wt 153.0 lb

## 2020-01-08 DIAGNOSIS — Z3480 Encounter for supervision of other normal pregnancy, unspecified trimester: Secondary | ICD-10-CM

## 2020-01-08 DIAGNOSIS — Z3A36 36 weeks gestation of pregnancy: Secondary | ICD-10-CM

## 2020-01-08 NOTE — Progress Notes (Signed)
   PRENATAL VISIT NOTE  Subjective:  Courtney Barnett is a 29 y.o. G3P1011 at [redacted]w[redacted]d being seen today for ongoing prenatal care.  She is currently monitored for the following issues for this low-risk pregnancy and has Vasovagal syndrome; Bouchard's node; Supervision of other normal pregnancy, antepartum; Nausea and vomiting of pregnancy, antepartum; and Rosacea on their problem list.  Patient reports no complaints. Her previous sinus congestion has resolved. No lingering issues  Contractions: Not present. Vag. Bleeding: None.  Movement: Present. Denies leaking of fluid.   The following portions of the patient's history were reviewed and updated as appropriate: allergies, current medications, past family history, past medical history, past social history, past surgical history and problem list. Problem list updated.  Objective:   Vitals:   01/08/20 1042  BP: 112/75  Pulse: 94  Weight: 153 lb (69.4 kg)    Fetal Status: Fetal Heart Rate (bpm): 141 Fundal Height: 36 cm Movement: Present  Presentation: Vertex  General:  Alert, oriented and cooperative. Patient is in no acute distress.  Skin: Skin is warm and dry. No rash noted.   Cardiovascular: Normal heart rate noted  Respiratory: Normal respiratory effort, no problems with respiration noted  Abdomen: Soft, gravid, appropriate for gestational age.  Pain/Pressure: Present     Pelvic: Cervical exam performed per patient request Dilation: Fingertip Effacement (%): Thick Station: Ballotable  Extremities: Normal range of motion.  Edema: None  Mental Status: Normal mood and affect. Normal behavior. Normal judgment and thought content.   Assessment and Plan:  Pregnancy: G3P1011 at [redacted]w[redacted]d  1. Supervision of other normal pregnancy, antepartum - LOB, no concerns - Reviewed typical labor triage for  - Hx SOL, Pit augmentation, VD at [redacted]w[redacted]d - Discussed timing of PDIOL as a convenience backup plan in absence of SOL.  - Might wait until 40 weeks  to schedule PRN  2. [redacted] weeks gestation of pregnancy - Preemptive teaching in event of positive result. Underscored availability of wireless monitors, GBS + does not automatically mean tied to primary line - Strep Gp B NAA - GC/Chlamydia probe amp ([place])not at [facility]  Preterm labor symptoms and general obstetric precautions including but not limited to vaginal bleeding, contractions, leaking of fluid and fetal movement were reviewed in detail with the patient. Please refer to After Visit Summary for other counseling recommendations.  Return in about 1 week (around 01/15/2020).  Future Appointments  Date Time Provider Department Center  01/15/2020  1:15 PM Federico Flake, MD [facility]  01/22/2020  9:30 AM Calvert Cantor, CNM [facility]  01/30/2020  8:45 AM Willodean Rosenthal, MD [facility]    Calvert Cantor, CNM

## 2020-01-08 NOTE — Patient Instructions (Signed)

## 2020-01-10 LAB — GC/CHLAMYDIA PROBE AMP (~~LOC~~) NOT AT ARMC
Chlamydia: NEGATIVE
Comment: NEGATIVE
Comment: NORMAL
Neisseria Gonorrhea: NEGATIVE

## 2020-01-10 LAB — STREP GP B NAA: Strep Gp B NAA: NEGATIVE

## 2020-01-15 ENCOUNTER — Encounter: Payer: Self-pay | Admitting: Family Medicine

## 2020-01-15 ENCOUNTER — Other Ambulatory Visit: Payer: Self-pay

## 2020-01-15 ENCOUNTER — Ambulatory Visit (INDEPENDENT_AMBULATORY_CARE_PROVIDER_SITE_OTHER): Payer: No Typology Code available for payment source | Admitting: Family Medicine

## 2020-01-15 VITALS — BP 109/73 | HR 94 | Wt 156.0 lb

## 2020-01-15 DIAGNOSIS — Z3480 Encounter for supervision of other normal pregnancy, unspecified trimester: Secondary | ICD-10-CM

## 2020-01-15 DIAGNOSIS — Z3A37 37 weeks gestation of pregnancy: Secondary | ICD-10-CM

## 2020-01-15 NOTE — Progress Notes (Signed)
   PRENATAL VISIT NOTE  Subjective:  Courtney Barnett is a 29 y.o. G3P1011 at [redacted]w[redacted]d being seen today for ongoing prenatal care.  She is currently monitored for the following issues for this low-risk pregnancy and has Vasovagal syndrome; Bouchard's node; Supervision of other normal pregnancy, antepartum; Nausea and vomiting of pregnancy, antepartum; and Rosacea on their problem list.  Patient reports no complaints.  Contractions: Irregular. Vag. Bleeding: None.  Movement: Present. Denies leaking of fluid.   The following portions of the patient's history were reviewed and updated as appropriate: allergies, current medications, past family history, past medical history, past social history, past surgical history and problem list.   Objective:   Vitals:   01/15/20 1313  BP: 109/73  Pulse: 94  Weight: 156 lb (70.8 kg)    Fetal Status: Fetal Heart Rate (bpm): 172   Movement: Present     General:  Alert, oriented and cooperative. Patient is in no acute distress.  Skin: Skin is warm and dry. No rash noted.   Cardiovascular: Normal heart rate noted  Respiratory: Normal respiratory effort, no problems with respiration noted  Abdomen: Soft, gravid, appropriate for gestational age.  Pain/Pressure: Present     Pelvic: Cervical exam performed in the presence of a chaperone        Extremities: Normal range of motion.  Edema: None  Mental Status: Normal mood and affect. Normal behavior. Normal judgment and thought content.   Assessment and Plan:  Pregnancy: G3P1011 at [redacted]w[redacted]d  1. Supervision of other normal pregnancy, antepartum Up to date Discussed timing for delivery- possibly interested in IOL on EDD but discussed low risk nature of pregnancy and safety to wait until delivery in the 41st week with antenatal surveillance. She will discuss with family  2. [redacted] weeks gestation of pregnancy   Term labor symptoms and general obstetric precautions including but not limited to vaginal bleeding,  contractions, leaking of fluid and fetal movement were reviewed in detail with the patient. Please refer to After Visit Summary for other counseling recommendations.   Return in about 1 week (around 01/22/2020) for Routine prenatal care.  Future Appointments  Date Time Provider Department Center  01/22/2020  9:30 AM Briant Sites [facility]  01/30/2020  8:45 AM Willodean Rosenthal, MD [facility]    Federico Flake, MD

## 2020-01-22 ENCOUNTER — Other Ambulatory Visit: Payer: Self-pay

## 2020-01-22 ENCOUNTER — Ambulatory Visit (INDEPENDENT_AMBULATORY_CARE_PROVIDER_SITE_OTHER): Payer: No Typology Code available for payment source | Admitting: Advanced Practice Midwife

## 2020-01-22 VITALS — BP 117/79 | HR 98 | Wt 156.2 lb

## 2020-01-22 DIAGNOSIS — Z3483 Encounter for supervision of other normal pregnancy, third trimester: Secondary | ICD-10-CM

## 2020-01-22 NOTE — Patient Instructions (Addendum)
 Third Trimester of Pregnancy The third trimester is from week 28 through week 40 (months 7 through 9). The third trimester is a time when the unborn baby (fetus) is growing rapidly. At the end of the ninth month, the fetus is about 20 inches in length and weighs 6-10 pounds. Body changes during your third trimester Your body will continue to go through many changes during pregnancy. The changes vary from woman to woman. During the third trimester:  Your weight will continue to increase. You can expect to gain 25-35 pounds (11-16 kg) by the end of the pregnancy.  You may begin to get stretch marks on your hips, abdomen, and breasts.  You may urinate more often because the fetus is moving lower into your pelvis and pressing on your bladder.  You may develop or continue to have heartburn. This is caused by increased hormones that slow down muscles in the digestive tract.  You may develop or continue to have constipation because increased hormones slow digestion and cause the muscles that push waste through your intestines to relax.  You may develop hemorrhoids. These are swollen veins (varicose veins) in the rectum that can itch or be painful.  You may develop swollen, bulging veins (varicose veins) in your legs.  You may have increased body aches in the pelvis, back, or thighs. This is due to weight gain and increased hormones that are relaxing your joints.  You may have changes in your hair. These can include thickening of your hair, rapid growth, and changes in texture. Some women also have hair loss during or after pregnancy, or hair that feels dry or thin. Your hair will most likely return to normal after your baby is born.  Your breasts will continue to grow and they will continue to become tender. A yellow fluid (colostrum) may leak from your breasts. This is the first milk you are producing for your baby.  Your belly button may stick out.  You may notice more swelling in your  hands, face, or ankles.  You may have increased tingling or numbness in your hands, arms, and legs. The skin on your belly may also feel numb.  You may feel short of breath because of your expanding uterus.  You may have more problems sleeping. This can be caused by the size of your belly, increased need to urinate, and an increase in your body's metabolism.  You may notice the fetus "dropping," or moving lower in your abdomen (lightening).  You may have increased vaginal discharge.  You may notice your joints feel loose and you may have pain around your pelvic bone. What to expect at prenatal visits You will have prenatal exams every 2 weeks until week 36. Then you will have weekly prenatal exams. During a routine prenatal visit:  You will be weighed to make sure you and the baby are growing normally.  Your blood pressure will be taken.  Your abdomen will be measured to track your baby's growth.  The fetal heartbeat will be listened to.  Any test results from the previous visit will be discussed.  You may have a cervical check near your due date to see if your cervix has softened or thinned (effaced).  You will be tested for Group B streptococcus. This happens between 35 and 37 weeks. Your health care provider may ask you:  What your birth plan is.  How you are feeling.  If you are feeling the baby move.  If you have had any   abnormal symptoms, such as leaking fluid, bleeding, severe headaches, or abdominal cramping.  If you are using any tobacco products, including cigarettes, chewing tobacco, and electronic cigarettes.  If you have any questions. Other tests or screenings that may be performed during your third trimester include:  Blood tests that check for low iron levels (anemia).  Fetal testing to check the health, activity level, and growth of the fetus. Testing is done if you have certain medical conditions or if there are problems during the  pregnancy.  Nonstress test (NST). This test checks the health of your baby to make sure there are no signs of problems, such as the baby not getting enough oxygen. During this test, a belt is placed around your belly. The baby is made to move, and its heart rate is monitored during movement. What is false labor? False labor is a condition in which you feel small, irregular tightenings of the muscles in the womb (contractions) that usually go away with rest, changing position, or drinking water. These are called Braxton Hicks contractions. Contractions may last for hours, days, or even weeks before true labor sets in. If contractions come at regular intervals, become more frequent, increase in intensity, or become painful, you should see your health care provider. What are the signs of labor?  Abdominal cramps.  Regular contractions that start at 10 minutes apart and become stronger and more frequent with time.  Contractions that start on the top of the uterus and spread down to the lower abdomen and back.  Increased pelvic pressure and dull back pain.  A watery or bloody mucus discharge that comes from the vagina.  Leaking of amniotic fluid. This is also known as your "water breaking." It could be a slow trickle or a gush. Let your health care provider know if it has a color or strange odor. If you have any of these signs, call your health care provider right away, even if it is before your due date. Follow these instructions at home: Medicines  Follow your health care provider's instructions regarding medicine use. Specific medicines may be either safe or unsafe to take during pregnancy.  Take a prenatal vitamin that contains at least 600 micrograms (mcg) of folic acid.  If you develop constipation, try taking a stool softener if your health care provider approves. Eating and drinking   Eat a balanced diet that includes fresh fruits and vegetables, whole grains, good sources of protein  such as meat, eggs, or tofu, and low-fat dairy. Your health care provider will help you determine the amount of weight gain that is right for you.  Avoid raw meat and uncooked cheese. These carry germs that can cause birth defects in the baby.  If you have low calcium intake from food, talk to your health care provider about whether you should take a daily calcium supplement.  Eat four or five small meals rather than three large meals a day.  Limit foods that are high in fat and processed sugars, such as fried and sweet foods.  To prevent constipation: ? Drink enough fluid to keep your urine clear or pale yellow. ? Eat foods that are high in fiber, such as fresh fruits and vegetables, whole grains, and beans. Activity  Exercise only as directed by your health care provider. Most women can continue their usual exercise routine during pregnancy. Try to exercise for 30 minutes at least 5 days a week. Stop exercising if you experience uterine contractions.  Avoid heavy lifting.    Do not exercise in extreme heat or humidity, or at high altitudes.  Wear low-heel, comfortable shoes.  Practice good posture.  You may continue to have sex unless your health care provider tells you otherwise. Relieving pain and discomfort  Take frequent breaks and rest with your legs elevated if you have leg cramps or low back pain.  Take warm sitz baths to soothe any pain or discomfort caused by hemorrhoids. Use hemorrhoid cream if your health care provider approves.  Wear a good support bra to prevent discomfort from breast tenderness.  If you develop varicose veins: ? Wear support pantyhose or compression stockings as told by your healthcare provider. ? Elevate your feet for 15 minutes, 3-4 times a day. Prenatal care  Write down your questions. Take them to your prenatal visits.  Keep all your prenatal visits as told by your health care provider. This is important. Safety  Wear your seat belt at  all times when driving.  Make a list of emergency phone numbers, including numbers for family, friends, the hospital, and police and fire departments. General instructions  Avoid cat litter boxes and soil used by cats. These carry germs that can cause birth defects in the baby. If you have a cat, ask someone to clean the litter box for you.  Do not travel far distances unless it is absolutely necessary and only with the approval of your health care provider.  Do not use hot tubs, steam rooms, or saunas.  Do not drink alcohol.  Do not use any products that contain nicotine or tobacco, such as cigarettes and e-cigarettes. If you need help quitting, ask your health care provider.  Do not use any medicinal herbs or unprescribed drugs. These chemicals affect the formation and growth of the baby.  Do not douche or use tampons or scented sanitary pads.  Do not cross your legs for long periods of time.  To prepare for the arrival of your baby: ? Take prenatal classes to understand, practice, and ask questions about labor and delivery. ? Make a trial run to the hospital. ? Visit the hospital and tour the maternity area. ? Arrange for maternity or paternity leave through employers. ? Arrange for family and friends to take care of pets while you are in the hospital. ? Purchase a rear-facing car seat and make sure you know how to install it in your car. ? Pack your hospital bag. ? Prepare the baby's nursery. Make sure to remove all pillows and stuffed animals from the baby's crib to prevent suffocation.  Visit your dentist if you have not gone during your pregnancy. Use a soft toothbrush to brush your teeth and be gentle when you floss. Contact a health care provider if:  You are unsure if you are in labor or if your water has broken.  You become dizzy.  You have mild pelvic cramps, pelvic pressure, or nagging pain in your abdominal area.  You have lower back pain.  You have persistent  nausea, vomiting, or diarrhea.  You have an unusual or bad smelling vaginal discharge.  You have pain when you urinate. Get help right away if:  Your water breaks before 37 weeks.  You have regular contractions less than 5 minutes apart before 37 weeks.  You have a fever.  You are leaking fluid from your vagina.  You have spotting or bleeding from your vagina.  You have severe abdominal pain or cramping.  You have rapid weight loss or weight gain.  You   have shortness of breath with chest pain.  You notice sudden or extreme swelling of your face, hands, ankles, feet, or legs.  Your baby makes fewer than 10 movements in 2 hours.  You have severe headaches that do not go away when you take medicine.  You have vision changes. Summary  The third trimester is from week 28 through week 40, months 7 through 9. The third trimester is a time when the unborn baby (fetus) is growing rapidly.  During the third trimester, your discomfort may increase as you and your baby continue to gain weight. You may have abdominal, leg, and back pain, sleeping problems, and an increased need to urinate.  During the third trimester your breasts will keep growing and they will continue to become tender. A yellow fluid (colostrum) may leak from your breasts. This is the first milk you are producing for your baby.  False labor is a condition in which you feel small, irregular tightenings of the muscles in the womb (contractions) that eventually go away. These are called Braxton Hicks contractions. Contractions may last for hours, days, or even weeks before true labor sets in.  Signs of labor can include: abdominal cramps; regular contractions that start at 10 minutes apart and become stronger and more frequent with time; watery or bloody mucus discharge that comes from the vagina; increased pelvic pressure and dull back pain; and leaking of amniotic fluid. This information is not intended to replace advice  given to you by your health care provider. Make sure you discuss any questions you have with your health care provider. Document Revised: 05/24/2018 Document Reviewed: 03/08/2016 Elsevier Patient Education  2020 [employer].  First Stage of Labor Labor is your body's natural process of moving your baby and other structures, including the placenta and umbilical cord, out of your uterus. There are three stages of labor. How long each stage lasts is different for every woman. But certain events happen during each stage that are the same for everyone.  The first stage starts when true labor begins. This stage ends when your cervix, which is the opening from your uterus into your vagina, is completely open (dilated).  The second stage begins when your cervix is fully dilated and you start pushing. This stage ends when your baby is born.  The third stage is the delivery of the organ that nourished your baby during pregnancy (placenta). First stage of labor As your due date gets closer, you may start to notice certain physical changes that mean labor is going to start soon. You may feel that your baby has dropped lower into your pelvis. You may experience irregular, often painless, contractions that go away when you walk around or lie down ([employer] contractions). This is also called false labor. The first stage of labor begins when you start having contractions that come at regular (evenly spaced) intervals and your cervix starts to get thinner and wider in preparation for your baby to pass through. Birth care providers measure the dilation of your cervix in centimeters (cm). One centimeter is a little less than one-half of an inch. The first stage ends when your cervix is dilated to 10 cm. The first stage of labor is divided into three phases:  Early phase.  Active phase.  Transitional phase. The length of the first stage of labor varies. It may be longer if this is your first pregnancy. You  may spend most of this stage at home trying to relax and stay comfortable.  How does this affect me? During the first stage of labor, you will move through three phases. What happens in the early phase?  You will start to have regular contractions that last 30-60 seconds. Contractions may come every 5-20 minutes. Keep track of your contractions and call your birth care provider.  Your water may break during this phase.  You may notice a clear or slightly bloody discharge of mucus (mucus plug) from your vagina.  Your cervix will dilate to 3-6 cm. What happens in the active phase? The active phase usually lasts 3-5 hours. You may go to the hospital or birth center around this time. During the active phase:  Your contractions will become stronger, longer, and more uncomfortable.  Your contractions may last 45-90 seconds and come every 3-5 minutes.  You may feel lower back pain.  Your birth care providers may examine your cervix and feel your belly to find the position of your baby.  You may have a monitor strapped to your belly to measure your contractions and your baby's heart rate.  You may start using your pain management options.  Your cervix may be dilated to 6 cm and may start to dilate more quickly. What happens in the transitional phase? The transitional phase typically lasts from 30 minutes to 2 hours. At the end of this phase, your cervix will be fully dilated to 10 cm. During the transitional phase:  Contractions will get stronger and longer.  Contractions may last 60-90 seconds and come less than 2 minutes apart.  You may feel hot flashes, chills, or nausea. How does this affect my baby? During the first stage of labor, your baby will gradually move down into your birth canal. Follow these instructions at home and in the hospital or birth center:   When labor first begins, try to stay calm. You are still in the early phase. If it is night, try to get some sleep. If it  is day, try to relax and save your energy. You may want to make some calls and get ready to go to the hospital or birth center.  When you are in the early phase, try these methods to help ease discomfort: ? Deep breathing and muscle relaxation. ? Taking a walk. ? Taking a warm bath or shower.  Drink some fluids and have a light snack if you feel like it.  Keep track of your contractions.  Based on the plan you created with your birth care provider, call when your contractions indicate it is time.  If your water breaks, note the time, color, and odor of the fluid.  When you are in the active phase, do your breathing exercises and rely on your support people and your team of birth care providers. Contact a health care provider if:  Your contractions are strong and regular.  You have lower back pain or cramping.  Your water breaks.  You lose your mucus plug. Get help right away if you:  Have a severe headache that does not go away.  Have changes in your vision.  Have severe pain in your upper belly.  Do not feel the baby move.  Have bright red bleeding. Summary  The first stage of labor starts when true labor begins, and it ends when your cervix is dilated to 10 cm.  The first stage of labor has three phases: early, active, and transitional.  Your baby moves into the birth canal during the first stage of labor.  You may have  contractions that become stronger and longer. You may also lose your mucus plug and have your water break.  Call your birth care provider when your contractions are frequent and strong enough to go to the hospital or birth center. This information is not intended to replace advice given to you by your health care provider. Make sure you discuss any questions you have with your health care provider. Document Revised: 05/24/2018 Document Reviewed: 04/16/2017 Elsevier Patient Education  2020 [employer].  Third Trimester of Pregnancy The third  trimester is from week 28 through week 40 (months 7 through 9). The third trimester is a time when the unborn baby (fetus) is growing rapidly. At the end of the ninth month, the fetus is about 20 inches in length and weighs 6-10 pounds. Body changes during your third trimester Your body will continue to go through many changes during pregnancy. The changes vary from woman to woman. During the third trimester:  Your weight will continue to increase. You can expect to gain 25-35 pounds (11-16 kg) by the end of the pregnancy.  You may begin to get stretch marks on your hips, abdomen, and breasts.  You may urinate more often because the fetus is moving lower into your pelvis and pressing on your bladder.  You may develop or continue to have heartburn. This is caused by increased hormones that slow down muscles in the digestive tract.  You may develop or continue to have constipation because increased hormones slow digestion and cause the muscles that push waste through your intestines to relax.  You may develop hemorrhoids. These are swollen veins (varicose veins) in the rectum that can itch or be painful.  You may develop swollen, bulging veins (varicose veins) in your legs.  You may have increased body aches in the pelvis, back, or thighs. This is due to weight gain and increased hormones that are relaxing your joints.  You may have changes in your hair. These can include thickening of your hair, rapid growth, and changes in texture. Some women also have hair loss during or after pregnancy, or hair that feels dry or thin. Your hair will most likely return to normal after your baby is born.  Your breasts will continue to grow and they will continue to become tender. A yellow fluid (colostrum) may leak from your breasts. This is the first milk you are producing for your baby.  Your belly button may stick out.  You may notice more swelling in your hands, face, or ankles.  You may have  increased tingling or numbness in your hands, arms, and legs. The skin on your belly may also feel numb.  You may feel short of breath because of your expanding uterus.  You may have more problems sleeping. This can be caused by the size of your belly, increased need to urinate, and an increase in your body's metabolism.  You may notice the fetus "dropping," or moving lower in your abdomen (lightening).  You may have increased vaginal discharge.  You may notice your joints feel loose and you may have pain around your pelvic bone. What to expect at prenatal visits You will have prenatal exams every 2 weeks until week 36. Then you will have weekly prenatal exams. During a routine prenatal visit:  You will be weighed to make sure you and the baby are growing normally.  Your blood pressure will be taken.  Your abdomen will be measured to track your baby's growth.  The fetal heartbeat will  be listened to.  Any test results from the previous visit will be discussed.  You may have a cervical check near your due date to see if your cervix has softened or thinned (effaced).  You will be tested for Group B streptococcus. This happens between 35 and 37 weeks. Your health care provider may ask you:  What your birth plan is.  How you are feeling.  If you are feeling the baby move.  If you have had any abnormal symptoms, such as leaking fluid, bleeding, severe headaches, or abdominal cramping.  If you are using any tobacco products, including cigarettes, chewing tobacco, and electronic cigarettes.  If you have any questions. Other tests or screenings that may be performed during your third trimester include:  Blood tests that check for low iron levels (anemia).  Fetal testing to check the health, activity level, and growth of the fetus. Testing is done if you have certain medical conditions or if there are problems during the pregnancy.  Nonstress test (NST). This test checks the  health of your baby to make sure there are no signs of problems, such as the baby not getting enough oxygen. During this test, a belt is placed around your belly. The baby is made to move, and its heart rate is monitored during movement. What is false labor? False labor is a condition in which you feel small, irregular tightenings of the muscles in the womb (contractions) that usually go away with rest, changing position, or drinking water. These are called Braxton Hicks contractions. Contractions may last for hours, days, or even weeks before true labor sets in. If contractions come at regular intervals, become more frequent, increase in intensity, or become painful, you should see your health care provider. What are the signs of labor?  Abdominal cramps.  Regular contractions that start at 10 minutes apart and become stronger and more frequent with time.  Contractions that start on the top of the uterus and spread down to the lower abdomen and back.  Increased pelvic pressure and dull back pain.  A watery or bloody mucus discharge that comes from the vagina.  Leaking of amniotic fluid. This is also known as your "water breaking." It could be a slow trickle or a gush. Let your health care provider know if it has a color or strange odor. If you have any of these signs, call your health care provider right away, even if it is before your due date. Follow these instructions at home: Medicines  Follow your health care provider's instructions regarding medicine use. Specific medicines may be either safe or unsafe to take during pregnancy.  Take a prenatal vitamin that contains at least 600 micrograms (mcg) of folic acid.  If you develop constipation, try taking a stool softener if your health care provider approves. Eating and drinking   Eat a balanced diet that includes fresh fruits and vegetables, whole grains, good sources of protein such as meat, eggs, or tofu, and low-fat dairy. Your  health care provider will help you determine the amount of weight gain that is right for you.  Avoid raw meat and uncooked cheese. These carry germs that can cause birth defects in the baby.  If you have low calcium intake from food, talk to your health care provider about whether you should take a daily calcium supplement.  Eat four or five small meals rather than three large meals a day.  Limit foods that are high in fat and processed sugars, such as  fried and sweet foods.  To prevent constipation: ? Drink enough fluid to keep your urine clear or pale yellow. ? Eat foods that are high in fiber, such as fresh fruits and vegetables, whole grains, and beans. Activity  Exercise only as directed by your health care provider. Most women can continue their usual exercise routine during pregnancy. Try to exercise for 30 minutes at least 5 days a week. Stop exercising if you experience uterine contractions.  Avoid heavy lifting.  Do not exercise in extreme heat or humidity, or at high altitudes.  Wear low-heel, comfortable shoes.  Practice good posture.  You may continue to have sex unless your health care provider tells you otherwise. Relieving pain and discomfort  Take frequent breaks and rest with your legs elevated if you have leg cramps or low back pain.  Take warm sitz baths to soothe any pain or discomfort caused by hemorrhoids. Use hemorrhoid cream if your health care provider approves.  Wear a good support bra to prevent discomfort from breast tenderness.  If you develop varicose veins: ? Wear support pantyhose or compression stockings as told by your healthcare provider. ? Elevate your feet for 15 minutes, 3-4 times a day. Prenatal care  Write down your questions. Take them to your prenatal visits.  Keep all your prenatal visits as told by your health care provider. This is important. Safety  Wear your seat belt at all times when driving.  Make a list of emergency  phone numbers, including numbers for family, friends, the hospital, and police and fire departments. General instructions  Avoid cat litter boxes and soil used by cats. These carry germs that can cause birth defects in the baby. If you have a cat, ask someone to clean the litter box for you.  Do not travel far distances unless it is absolutely necessary and only with the approval of your health care provider.  Do not use hot tubs, steam rooms, or saunas.  Do not drink alcohol.  Do not use any products that contain nicotine or tobacco, such as cigarettes and e-cigarettes. If you need help quitting, ask your health care provider.  Do not use any medicinal herbs or unprescribed drugs. These chemicals affect the formation and growth of the baby.  Do not douche or use tampons or scented sanitary pads.  Do not cross your legs for long periods of time.  To prepare for the arrival of your baby: ? Take prenatal classes to understand, practice, and ask questions about labor and delivery. ? Make a trial run to the hospital. ? Visit the hospital and tour the maternity area. ? Arrange for maternity or paternity leave through employers. ? Arrange for family and friends to take care of pets while you are in the hospital. ? Purchase a rear-facing car seat and make sure you know how to install it in your car. ? Pack your hospital bag. ? Prepare the baby's nursery. Make sure to remove all pillows and stuffed animals from the baby's crib to prevent suffocation.  Visit your dentist if you have not gone during your pregnancy. Use a soft toothbrush to brush your teeth and be gentle when you floss. Contact a health care provider if:  You are unsure if you are in labor or if your water has broken.  You become dizzy.  You have mild pelvic cramps, pelvic pressure, or nagging pain in your abdominal area.  You have lower back pain.  You have persistent nausea, vomiting, or diarrhea.  You have an  unusual or bad smelling vaginal discharge.  You have pain when you urinate. Get help right away if:  Your water breaks before 37 weeks.  You have regular contractions less than 5 minutes apart before 37 weeks.  You have a fever.  You are leaking fluid from your vagina.  You have spotting or bleeding from your vagina.  You have severe abdominal pain or cramping.  You have rapid weight loss or weight gain.  You have shortness of breath with chest pain.  You notice sudden or extreme swelling of your face, hands, ankles, feet, or legs.  Your baby makes fewer than 10 movements in 2 hours.  You have severe headaches that do not go away when you take medicine.  You have vision changes. Summary  The third trimester is from week 28 through week 40, months 7 through 9. The third trimester is a time when the unborn baby (fetus) is growing rapidly.  During the third trimester, your discomfort may increase as you and your baby continue to gain weight. You may have abdominal, leg, and back pain, sleeping problems, and an increased need to urinate.  During the third trimester your breasts will keep growing and they will continue to become tender. A yellow fluid (colostrum) may leak from your breasts. This is the first milk you are producing for your baby.  False labor is a condition in which you feel small, irregular tightenings of the muscles in the womb (contractions) that eventually go away. These are called Braxton Hicks contractions. Contractions may last for hours, days, or even weeks before true labor sets in.  Signs of labor can include: abdominal cramps; regular contractions that start at 10 minutes apart and become stronger and more frequent with time; watery or bloody mucus discharge that comes from the vagina; increased pelvic pressure and dull back pain; and leaking of amniotic fluid. This information is not intended to replace advice given to you by your health care provider.  Make sure you discuss any questions you have with your health care provider. Document Revised: 05/24/2018 Document Reviewed: 03/08/2016 Elsevier Patient Education  2020 [employer].

## 2020-01-22 NOTE — Progress Notes (Signed)
   PRENATAL VISIT NOTE  Subjective:  Courtney Barnett is a 29 y.o. G3P1011 at [redacted]w[redacted]d being seen today for ongoing prenatal care.  She is currently monitored for the following issues for this low-risk pregnancy and has Vasovagal syndrome; Bouchard's node; Supervision of other normal pregnancy, antepartum; Nausea and vomiting of pregnancy, antepartum; and Rosacea on their problem list.  Patient reports no complaints.  Contractions: Not present. Vag. Bleeding: None.  Movement: Present. Denies leaking of fluid.   The following portions of the patient's history were reviewed and updated as appropriate: allergies, current medications, past family history, past medical history, past social history, past surgical history and problem list.   Objective:   Vitals:   01/22/20 0937  BP: 117/79  Pulse: 98  Weight: 156 lb 3.2 oz (70.9 kg)    Fetal Status: Fetal Heart Rate (bpm): 145 Fundal Height: 38 cm Movement: Present     General:  Alert, oriented and cooperative. Patient is in no acute distress.  Skin: Skin is warm and dry. No rash noted.   Cardiovascular: Normal heart rate noted  Respiratory: Normal respiratory effort, no problems with respiration noted  Abdomen: Soft, gravid, appropriate for gestational age.  Pain/Pressure: Present     Pelvic: Cervical exam performed in the presence of a chaperone Dilation: 1 Effacement (%): Thick    Extremities: Normal range of motion.  Edema: None  Mental Status: Normal mood and affect. Normal behavior. Normal judgment and thought content.   Assessment and Plan:  Pregnancy: G3P1011 at [redacted]w[redacted]d 1. Encounter for supervision of other normal pregnancy in third trimester Discussed that on next visit can do sweeping of membranes. Will schedule for induction in the event patient does not go in labor prior to 41 weeks.  Term labor symptoms and general obstetric precautions including but not limited to vaginal bleeding, contractions, leaking of fluid and fetal  movement were reviewed in detail with the patient. Please refer to After Visit Summary for other counseling recommendations.   Return in 1 week (on 01/29/2020).  Future Appointments  Date Time Provider Department Center  01/30/2020  8:45 AM Willodean Rosenthal, MD [facility]    Kerrie Buffalo, NP

## 2020-01-28 ENCOUNTER — Encounter (HOSPITAL_COMMUNITY): Payer: Self-pay | Admitting: Obstetrics & Gynecology

## 2020-01-28 ENCOUNTER — Inpatient Hospital Stay (HOSPITAL_COMMUNITY)
Admission: AD | Admit: 2020-01-28 | Discharge: 2020-01-28 | Disposition: A | Discharge status: Home / Self Care | Payer: No Typology Code available for payment source | Attending: Obstetrics & Gynecology | Admitting: Obstetrics & Gynecology

## 2020-01-28 ENCOUNTER — Other Ambulatory Visit: Payer: Self-pay

## 2020-01-28 DIAGNOSIS — Z3A39 39 weeks gestation of pregnancy: Secondary | ICD-10-CM | POA: Diagnosis not present

## 2020-01-28 DIAGNOSIS — O471 False labor at or after 37 completed weeks of gestation: Secondary | ICD-10-CM | POA: Diagnosis present

## 2020-01-28 DIAGNOSIS — O479 False labor, unspecified: Secondary | ICD-10-CM

## 2020-01-28 NOTE — Progress Notes (Signed)
S: Ms. Tuwanna Krausz is a 29 y.o. G3P1011 at [redacted]w[redacted]d  who presents to [facility]U today for labor evaluation.     Cervical exam by RN:  Dilation: 2 Effacement (%): 40 Cervical Position: Posterior Station: -2 Presentation: Vertex Exam by:: Lamont Snowball, RN  Fetal Monitoring: Baseline: 130bpm Variability: mod Accelerations: present Decelerations: absent Contractions: q2-47min  MDM Discussed patient with RN. NST reviewed.   A: SIUP at [redacted]w[redacted]d  False labor  P: Discharge home Labor precautions and kick counts included in AVS Patient to follow-up with OBGYN as scheduled  Patient may return to MAU as needed or when in labor   Alric Seton, MD 01/28/2020 4:06 AM

## 2020-01-28 NOTE — Progress Notes (Signed)
I have communicated with Germaine Pomfret, MD and reviewed vital signs:  Vitals:   01/28/20 0103 01/28/20 0358  BP: 115/83 117/83  Pulse: 97 89  Resp: 16 16  Temp: 98.5 F (36.9 C) 98.2 F (36.8 C)  SpO2: 100%     Vaginal exam:  Dilation: 2 Effacement (%): 40 Cervical Position: Posterior Station: -2 Presentation: Vertex Exam by:: Lamont Snowball, RN,   Also reviewed contraction pattern and that non-stress test is reactive.  It has been documented that patient is contracting every 4-9 minutes with minimal cervical change over 3 hours not indicating active labor.  Patient denies any other complaints.  Based on this report provider has given order for discharge.  A discharge order and diagnosis entered by a provider.   Labor discharge instructions reviewed with patient.

## 2020-01-28 NOTE — Discharge Instructions (Signed)
Braxton Hicks Contractions °Contractions of the uterus can occur throughout pregnancy, but they are not always a sign that you are in labor. You may have practice contractions called Braxton Hicks contractions. These false labor contractions are sometimes confused with true labor. °What are Braxton Hicks contractions? °Braxton Hicks contractions are tightening movements that occur in the muscles of the uterus before labor. Unlike true labor contractions, these contractions do not result in opening (dilation) and thinning of the cervix. Toward the end of pregnancy (32-34 weeks), Braxton Hicks contractions can happen more often and may become stronger. These contractions are sometimes difficult to tell apart from true labor because they can be very uncomfortable. You should not feel embarrassed if you go to the hospital with false labor. °Sometimes, the only way to tell if you are in true labor is for your health care provider to look for changes in the cervix. The health care provider will do a physical exam and may monitor your contractions. If you are not in true labor, the exam should show that your cervix is not dilating and your water has not broken. °If there are no other health problems associated with your pregnancy, it is completely safe for you to be sent home with false labor. You may continue to have Braxton Hicks contractions until you go into true labor. °How to tell the difference between true labor and false labor °True labor °· Contractions last 30-70 seconds. °· Contractions become very regular. °· Discomfort is usually felt in the top of the uterus, and it spreads to the lower abdomen and low back. °· Contractions do not go away with walking. °· Contractions usually become more intense and increase in frequency. °· The cervix dilates and gets thinner. °False labor °· Contractions are usually shorter and not as strong as true labor contractions. °· Contractions are usually irregular. °· Contractions  are often felt in the front of the lower abdomen and in the groin. °· Contractions may go away when you walk around or change positions while lying down. °· Contractions get weaker and are shorter-lasting as time goes on. °· The cervix usually does not dilate or become thin. °Follow these instructions at home: ° °· Take over-the-counter and prescription medicines only as told by your health care provider. °· Keep up with your usual exercises and follow other instructions from your health care provider. °· Eat and drink lightly if you think you are going into labor. °· If Braxton Hicks contractions are making you uncomfortable: °? Change your position from lying down or resting to walking, or change from walking to resting. °? Sit and rest in a tub of warm water. °? Drink enough fluid to keep your urine pale yellow. Dehydration may cause these contractions. °? Do slow and deep breathing several times an hour. °· Keep all follow-up prenatal visits as told by your health care provider. This is important. °Contact a health care provider if: °· You have a fever. °· You have continuous pain in your abdomen. °Get help right away if: °· Your contractions become stronger, more regular, and closer together. °· You have fluid leaking or gushing from your vagina. °· You pass blood-tinged mucus (bloody show). °· You have bleeding from your vagina. °· You have low back pain that you never had before. °· You feel your baby’s head pushing down and causing pelvic pressure. °· Your baby is not moving inside you as much as it used to. °Summary °· Contractions that occur before labor are   called Braxton Hicks contractions, false labor, or practice contractions. °· Braxton Hicks contractions are usually shorter, weaker, farther apart, and less regular than true labor contractions. True labor contractions usually become progressively stronger and regular, and they become more frequent. °· Manage discomfort from Braxton Hicks contractions  by changing position, resting in a warm bath, drinking plenty of water, or practicing deep breathing. °This information is not intended to replace advice given to you by your health care provider. Make sure you discuss any questions you have with your health care provider. °Document Revised: 01/13/2017 Document Reviewed: 06/16/2016 °Elsevier Patient Education © 2020 Elsevier Inc. ° °

## 2020-01-28 NOTE — MAU Note (Signed)
Pt reports contractions all day , getting stronger tonight. Denies bleeding or ROM. Reports positive fetal movement

## 2020-01-30 ENCOUNTER — Telehealth (HOSPITAL_COMMUNITY): Payer: Self-pay | Admitting: *Deleted

## 2020-01-30 ENCOUNTER — Other Ambulatory Visit: Payer: Self-pay

## 2020-01-30 ENCOUNTER — Other Ambulatory Visit (HOSPITAL_COMMUNITY)
Admission: RE | Admit: 2020-01-30 | Discharge: 2020-01-30 | Disposition: A | Discharge status: Home / Self Care | Payer: No Typology Code available for payment source | Source: Ambulatory Visit | Attending: Obstetrics and Gynecology | Admitting: Obstetrics and Gynecology

## 2020-01-30 ENCOUNTER — Encounter (HOSPITAL_COMMUNITY): Payer: Self-pay | Admitting: *Deleted

## 2020-01-30 ENCOUNTER — Ambulatory Visit (INDEPENDENT_AMBULATORY_CARE_PROVIDER_SITE_OTHER): Payer: No Typology Code available for payment source | Admitting: Student

## 2020-01-30 VITALS — BP 120/87 | HR 86 | Wt 155.0 lb

## 2020-01-30 DIAGNOSIS — Z3A39 39 weeks gestation of pregnancy: Secondary | ICD-10-CM

## 2020-01-30 DIAGNOSIS — Z20822 Contact with and (suspected) exposure to covid-19: Secondary | ICD-10-CM | POA: Insufficient documentation

## 2020-01-30 DIAGNOSIS — Z01812 Encounter for preprocedural laboratory examination: Secondary | ICD-10-CM | POA: Insufficient documentation

## 2020-01-30 LAB — SARS CORONAVIRUS 2 (TAT 6-24 HRS): SARS Coronavirus 2: NEGATIVE

## 2020-01-30 NOTE — Progress Notes (Signed)
   PRENATAL VISIT NOTE  Subjective:  Courtney Barnett is a 29 y.o. G3P1011 at [redacted]w[redacted]d being seen today for ongoing prenatal care.  She is currently monitored for the following issues for this low-risk pregnancy and has Vasovagal syndrome; Bouchard's node; Supervision of other normal pregnancy, antepartum; Nausea and vomiting of pregnancy, antepartum; and Rosacea on their problem list.  Patient reports no complaints. Some irregular contractions.  Contractions: Regular. Vag. Bleeding: None.  Movement: Present. Denies leaking of fluid.   The following portions of the patient's history were reviewed and updated as appropriate: allergies, current medications, past family history, past medical history, past social history, past surgical history and problem list.   Objective:   Vitals:   01/30/20 0838  BP: 120/87  Pulse: 86  Weight: 155 lb (70.3 kg)    Fetal Status: Fetal Heart Rate (bpm): 145 Fundal Height: 40 cm Movement: Present  Presentation: Vertex  General:  Alert, oriented and cooperative. Patient is in no acute distress.  Skin: Skin is warm and dry. No rash noted.   Cardiovascular: Normal heart rate noted  Respiratory: Normal respiratory effort, no problems with respiration noted  Abdomen: Soft, gravid, appropriate for gestational age.  Pain/Pressure: Present     Pelvic: Cervical exam performed in the presence of a chaperone Dilation: 2.5 Effacement (%): 50 Station: Ballotable  Extremities: Normal range of motion.  Edema: None  Mental Status: Normal mood and affect. Normal behavior. Normal judgment and thought content.   Assessment and Plan:  Pregnancy: G3P1011 at [redacted]w[redacted]d  1. [redacted] weeks gestation of pregnancy   2. Patient doing well, cervix checked and swept and she is now 2.5 cm, ballotable. Confirmed patient wants IOL for this weekend. Orders are placed.   Preterm labor symptoms and general obstetric precautions including but not limited to vaginal bleeding, contractions, leaking  of fluid and fetal movement were reviewed in detail with the patient. Please refer to After Visit Summary for other counseling recommendations.   No follow-ups on file.  Future Appointments  Date Time Provider Department Center  02/01/2020  8:40 AM [facility] SCHED ROOM [facility] None    Marylene Land, [place]

## 2020-01-30 NOTE — Patient Instructions (Signed)

## 2020-01-30 NOTE — Telephone Encounter (Signed)
Preadmission screen  

## 2020-02-01 ENCOUNTER — Inpatient Hospital Stay (HOSPITAL_COMMUNITY): Payer: No Typology Code available for payment source | Admitting: Anesthesiology

## 2020-02-01 ENCOUNTER — Other Ambulatory Visit: Payer: Self-pay

## 2020-02-01 ENCOUNTER — Encounter (HOSPITAL_COMMUNITY): Payer: Self-pay | Admitting: Student

## 2020-02-01 ENCOUNTER — Inpatient Hospital Stay (HOSPITAL_COMMUNITY)
Admission: AD | Admit: 2020-02-01 | Discharge: 2020-02-03 | DRG: 807 | Disposition: A | Discharge status: Home / Self Care | Payer: No Typology Code available for payment source | Attending: Obstetrics & Gynecology | Admitting: Obstetrics & Gynecology

## 2020-02-01 ENCOUNTER — Inpatient Hospital Stay (HOSPITAL_COMMUNITY): Payer: No Typology Code available for payment source

## 2020-02-01 DIAGNOSIS — Z20822 Contact with and (suspected) exposure to covid-19: Secondary | ICD-10-CM | POA: Diagnosis present

## 2020-02-01 DIAGNOSIS — Z3A40 40 weeks gestation of pregnancy: Secondary | ICD-10-CM

## 2020-02-01 DIAGNOSIS — O26893 Other specified pregnancy related conditions, third trimester: Secondary | ICD-10-CM | POA: Diagnosis present

## 2020-02-01 LAB — CBC
HCT: 32.2 % — ABNORMAL LOW (ref 36.0–46.0)
Hemoglobin: 10.8 g/dL — ABNORMAL LOW (ref 12.0–15.0)
MCH: 29.1 pg (ref 26.0–34.0)
MCHC: 33.5 g/dL (ref 30.0–36.0)
MCV: 86.8 fL (ref 80.0–100.0)
Platelets: 324 10*3/uL (ref 150–400)
RBC: 3.71 MIL/uL — ABNORMAL LOW (ref 3.87–5.11)
RDW: 12.9 % (ref 11.5–15.5)
WBC: 7.8 10*3/uL (ref 4.0–10.5)
nRBC: 0 % (ref 0.0–0.2)

## 2020-02-01 LAB — TYPE AND SCREEN
ABO/RH(D): O POS
Antibody Screen: NEGATIVE

## 2020-02-01 MED ORDER — LACTATED RINGERS IV SOLN: INTRAVENOUS | Status: DC

## 2020-02-01 MED ORDER — TERBUTALINE SULFATE 1 MG/ML IJ SOLN: 0.2500 mg | Freq: Once | INTRAMUSCULAR | Status: DC | PRN

## 2020-02-01 MED ORDER — OXYTOCIN-SODIUM CHLORIDE 30-0.9 UT/500ML-% IV SOLN
2.5000 [IU]/h | INTRAVENOUS | Status: DC
  Administered 2020-02-02: 10:00:00 2.5 [IU]/h via INTRAVENOUS

## 2020-02-01 MED ORDER — EPHEDRINE 5 MG/ML INJ: 10.0000 mg | INTRAVENOUS | Status: DC | PRN

## 2020-02-01 MED ORDER — PHENYLEPHRINE 40 MCG/ML (10ML) SYRINGE FOR IV PUSH (FOR BLOOD PRESSURE SUPPORT)
80.0000 ug | PREFILLED_SYRINGE | INTRAVENOUS | Status: DC | PRN
  Administered 2020-02-02: 01:00:00 80 ug via INTRAVENOUS

## 2020-02-01 MED ORDER — OXYTOCIN-SODIUM CHLORIDE 30-0.9 UT/500ML-% IV SOLN
1.0000 m[IU]/min | INTRAVENOUS | Status: DC
  Administered 2020-02-02: 2 m[IU]/min via INTRAVENOUS
  Filled 2020-02-01: qty 500

## 2020-02-01 MED ORDER — OXYTOCIN BOLUS FROM INFUSION
333.0000 mL | Freq: Once | INTRAVENOUS | Status: AC
  Administered 2020-02-02: 09:00:00 333 mL via INTRAVENOUS

## 2020-02-01 MED ORDER — FENTANYL CITRATE (PF) 100 MCG/2ML IJ SOLN
100.0000 ug | INTRAMUSCULAR | Status: DC | PRN
Start: 2020-02-01 — End: 2020-02-03

## 2020-02-01 MED ORDER — PHENYLEPHRINE 40 MCG/ML (10ML) SYRINGE FOR IV PUSH (FOR BLOOD PRESSURE SUPPORT)
80.0000 ug | PREFILLED_SYRINGE | INTRAVENOUS | Status: DC | PRN
  Filled 2020-02-01: qty 10

## 2020-02-01 MED ORDER — MISOPROSTOL 25 MCG QUARTER TABLET
ORAL_TABLET | ORAL | Status: AC
  Filled 2020-02-01: qty 1

## 2020-02-01 MED ORDER — LACTATED RINGERS IV SOLN
500.0000 mL | Freq: Once | INTRAVENOUS | Status: AC
  Administered 2020-02-01: 23:00:00 500 mL via INTRAVENOUS

## 2020-02-01 MED ORDER — ACETAMINOPHEN 325 MG PO TABS
650.0000 mg | ORAL_TABLET | ORAL | Status: DC | PRN
Start: 2020-02-01 — End: 2020-02-02

## 2020-02-01 MED ORDER — ONDANSETRON HCL 4 MG/2ML IJ SOLN
4.0000 mg | Freq: Four times a day (QID) | INTRAMUSCULAR | Status: DC | PRN
  Administered 2020-02-02 (×2): 4 mg via INTRAVENOUS
  Filled 2020-02-01 (×2): qty 2

## 2020-02-01 MED ORDER — MISOPROSTOL 50MCG HALF TABLET: 50.0000 ug | ORAL_TABLET | ORAL | Status: DC | PRN

## 2020-02-01 MED ORDER — FENTANYL-BUPIVACAINE-NACL 0.5-0.125-0.9 MG/250ML-% EP SOLN
12.0000 mL/h | EPIDURAL | Status: DC | PRN
  Filled 2020-02-01: qty 250

## 2020-02-01 MED ORDER — OXYCODONE-ACETAMINOPHEN 5-325 MG PO TABS: 2.0000 | ORAL_TABLET | ORAL | Status: DC | PRN

## 2020-02-01 MED ORDER — DIPHENHYDRAMINE HCL 50 MG/ML IJ SOLN: 12.5000 mg | INTRAMUSCULAR | Status: DC | PRN

## 2020-02-01 MED ORDER — MISOPROSTOL 50MCG HALF TABLET
ORAL_TABLET | ORAL | Status: AC
  Administered 2020-02-01: 19:00:00 50 ug via BUCCAL
  Filled 2020-02-01: qty 1

## 2020-02-01 MED ORDER — SOD CITRATE-CITRIC ACID 500-334 MG/5ML PO SOLN: 30.0000 mL | ORAL | Status: DC | PRN

## 2020-02-01 MED ORDER — OXYCODONE-ACETAMINOPHEN 5-325 MG PO TABS: 1.0000 | ORAL_TABLET | ORAL | Status: DC | PRN

## 2020-02-01 MED ORDER — LIDOCAINE HCL (PF) 1 % IJ SOLN
30.0000 mL | INTRAMUSCULAR | Status: DC | PRN
Start: 2020-02-01 — End: 2020-02-02

## 2020-02-01 MED ORDER — LACTATED RINGERS IV SOLN
500.0000 mL | INTRAVENOUS | Status: DC | PRN
  Administered 2020-02-01: 22:00:00 500 mL via INTRAVENOUS
  Administered 2020-02-02: 06:00:00 1000 mL via INTRAVENOUS
  Administered 2020-02-02: 09:00:00 500 mL via INTRAVENOUS

## 2020-02-01 NOTE — Progress Notes (Signed)
Labor Progress Note Courtney Barnett is a 29 y.o. G3P1011 at [redacted]w[redacted]d presented for eIOL.  S: Pt reporting increasing discomfort with contractions. Desires epidural at this time.   O:  BP 132/86   Pulse 67   Temp 98.1 F (36.7 C) (Oral)   Resp 18   Ht 5\' 3"  (1.6 m)   Wt 70.3 kg   LMP 04/26/2019 (Exact Date)   BMI 27.46 kg/m  EFM: baseline 125/moderate variability/+accels, no decels Toco: q3-4 min  CVE: Dilation: 4 Effacement (%): 60 Station: -1 Presentation: Vertex Exam by:: Nalia Honeycutt, MD   A&P: 29 y.o. G3P1011 [redacted]w[redacted]d presented for eIOL. #Labor: Progressing well. S/p cytotec x1 at 1546. Pt desires epidural at this time. Will plan to start pitocin s/p placement of epidural. #Pain: plan for epidural #FWB: Category 1 strip #GBS negative  [redacted]w[redacted]d, MD 11:17 PM

## 2020-02-01 NOTE — H&P (Signed)
OBSTETRIC ADMISSION HISTORY AND PHYSICAL  Courtney Barnett is a 29 y.o. female G3P1011 with IUP at [redacted]w[redacted]d by LMP presenting for elective IOL. She reports +FMs, No LOF, no VB, no blurry vision, headaches or peripheral edema, and RUQ pain.  She plans on breast and bottle feeding. She request IUD for birth control. She received her prenatal care at [facility]   Dating: By LMP --->  Estimated Date of Delivery: 01/31/20  Prenatal History/Complications: none  Past Medical History: Past Medical History:  Diagnosis Date  . Anxiety   . Biliary colic 12/13/2016  . GAD (generalized anxiety disorder) 03/19/2018  . Vasovagal syncope     Past Surgical History: Past Surgical History:  Procedure Laterality Date  . BREAST SURGERY Left 03/2018   biopsy  . CHOLECYSTECTOMY  12/20/2016   Procedure: LAPAROSCOPIC CHOLECYSTECTOMY WITH INTRAOPERATIVE CHOLANGIOGRAM;  Surgeon: Ancil Linsey, MD;  Location: [facility] ORS;  Service: General;;  . INTRAUTERINE DEVICE (IUD) INSERTION  09/2013  . WISDOM TOOTH EXTRACTION      Obstetrical History: OB History    Gravida  3   Para  1   Term  1   Preterm      AB  1   Living  1     SAB  1   IAB      Ectopic      Multiple      Live Births  1           Social History Social History   Socioeconomic History  . Marital status: Married    Spouse name: Not on file  . Number of children: Not on file  . Years of education: Not on file  . Highest education level: Not on file  Occupational History  . Not on file  Tobacco Use  . Smoking status: Never Smoker  . Smokeless tobacco: Never Used  Vaping Use  . Vaping Use: Never used  Substance and Sexual Activity  . Alcohol use: No  . Drug use: No  . Sexual activity: Yes    Partners: Male    Birth control/protection: None  Other Topics Concern  . Not on file  Social History Narrative  . Not on file   Social Determinants of Health   Financial Resource Strain: Not on file  Food Insecurity:  Not on file  Transportation Needs: Not on file  Physical Activity: Not on file  Stress: Not on file  Social Connections: Not on file    Family History: Family History  Problem Relation Age of Onset  . Diabetes Maternal Grandfather   . Colon cancer Neg Hx   . Stomach cancer Neg Hx   . Pancreatic cancer Neg Hx     Allergies: No Known Allergies  Medications Prior to Admission  Medication Sig Dispense Refill Last Dose  . Prenatal Vit-Fe Fumarate-FA (PRENATAL VITAMIN PO) Take by mouth.        Review of Systems   All systems reviewed and negative except as stated in HPI  Last menstrual period 04/26/2019, unknown if currently breastfeeding. General appearance: alert, cooperative and no distress Lungs: clear to auscultation bilaterally Heart: regular rate and rhythm Abdomen: soft, non-tender; bowel sounds normal Pelvic: n/a Extremities: Homans sign is negative, no sign of DVT DTR's +2 Presentation: cephalic Fetal monitoringBaseline: 140 bpm, Variability: Good {> 6 bpm), Accelerations: Reactive and Decelerations: Absent Uterine activity: occasional uc's     Prenatal labs: ABO, Rh: O/Positive/-- (06/01 1031) Antibody: Negative (06/01 1031) Rubella: 2.21 (06/01 1031)  RPR: Non Reactive (09/29 0856)  HBsAg: Negative (06/01 1031)  HIV: Non Reactive (09/29 0858)  GBS: Negative/-- (11/24 1100)   Prenatal Transfer Tool  Maternal Diabetes: No Genetic Screening: Normal Maternal Ultrasounds/Referrals: Normal Fetal Ultrasounds or other Referrals:  None Maternal Substance Abuse:  No Significant Maternal Medications:  None Significant Maternal Lab Results: Group B Strep negative  No results found for this or any previous visit (from the past 24 hour(s)).  Patient Active Problem List   Diagnosis Date Noted  . Indication for care in labor and delivery, antepartum 02/01/2020  . Nausea and vomiting of pregnancy, antepartum 07/16/2019  . Rosacea 07/16/2019  . Supervision of  other normal pregnancy, antepartum 07/12/2019  . 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 Vasovagal syndrome 04/07/2016    Assessment/Plan:  Courtney Barnett is a 29 y.o. G3P1011 at [redacted]w[redacted]d here for elective IOL  #Labor: cytotec #Pain: Per patient request #FWB: Cat 1 #ID:  GBS neg #MOF: both  #MOC:IUD #Circ:  n/a  Rolm Bookbinder, CNM  02/01/2020, 6:32 PM

## 2020-02-02 ENCOUNTER — Encounter (HOSPITAL_COMMUNITY): Payer: Self-pay

## 2020-02-02 DIAGNOSIS — Z3A40 40 weeks gestation of pregnancy: Secondary | ICD-10-CM

## 2020-02-02 LAB — RPR: RPR Ser Ql: NONREACTIVE

## 2020-02-02 MED ORDER — ONDANSETRON HCL 4 MG PO TABS: 4.0000 mg | ORAL_TABLET | ORAL | Status: DC | PRN

## 2020-02-02 MED ORDER — PRENATAL MULTIVITAMIN CH
1.0000 | ORAL_TABLET | Freq: Every day | ORAL | Status: DC
  Administered 2020-02-03: 12:00:00 1 via ORAL
  Filled 2020-02-02: qty 1

## 2020-02-02 MED ORDER — ACETAMINOPHEN 325 MG PO TABS
650.0000 mg | ORAL_TABLET | ORAL | Status: DC | PRN
  Administered 2020-02-02: 650 mg via ORAL
  Filled 2020-02-02: qty 2

## 2020-02-02 MED ORDER — COCONUT OIL OIL
1.0000 "application " | TOPICAL_OIL | Status: DC | PRN
  Administered 2020-02-03: 1 via TOPICAL

## 2020-02-02 MED ORDER — SIMETHICONE 80 MG PO CHEW: 80.0000 mg | CHEWABLE_TABLET | ORAL | Status: DC | PRN

## 2020-02-02 MED ORDER — DIPHENHYDRAMINE HCL 25 MG PO CAPS: 25.0000 mg | ORAL_CAPSULE | Freq: Four times a day (QID) | ORAL | Status: DC | PRN

## 2020-02-02 MED ORDER — FERROUS SULFATE 325 (65 FE) MG PO TABS
325.0000 mg | ORAL_TABLET | Freq: Two times a day (BID) | ORAL | Status: DC
  Administered 2020-02-02 – 2020-02-03 (×2): 325 mg via ORAL
  Filled 2020-02-02 (×2): qty 1

## 2020-02-02 MED ORDER — OXYCODONE-ACETAMINOPHEN 5-325 MG PO TABS: 1.0000 | ORAL_TABLET | ORAL | Status: DC | PRN

## 2020-02-02 MED ORDER — SODIUM CHLORIDE (PF) 0.9 % IJ SOLN
INTRAMUSCULAR | Status: DC | PRN
  Administered 2020-02-01: 12 mL/h via EPIDURAL

## 2020-02-02 MED ORDER — MEASLES, MUMPS & RUBELLA VAC IJ SOLR: 0.5000 mL | Freq: Once | INTRAMUSCULAR | Status: DC

## 2020-02-02 MED ORDER — BENZOCAINE-MENTHOL 20-0.5 % EX AERO: 1.0000 "application " | INHALATION_SPRAY | CUTANEOUS | Status: DC | PRN

## 2020-02-02 MED ORDER — TETANUS-DIPHTH-ACELL PERTUSSIS 5-2.5-18.5 LF-MCG/0.5 IM SUSY
0.5000 mL | PREFILLED_SYRINGE | Freq: Once | INTRAMUSCULAR | Status: DC
Start: 2020-02-03 — End: 2020-02-03

## 2020-02-02 MED ORDER — DIBUCAINE (PERIANAL) 1 % EX OINT: 1.0000 "application " | TOPICAL_OINTMENT | CUTANEOUS | Status: DC | PRN

## 2020-02-02 MED ORDER — OXYCODONE-ACETAMINOPHEN 5-325 MG PO TABS: 2.0000 | ORAL_TABLET | ORAL | Status: DC | PRN

## 2020-02-02 MED ORDER — ZOLPIDEM TARTRATE 5 MG PO TABS: 5.0000 mg | ORAL_TABLET | Freq: Every evening | ORAL | Status: DC | PRN

## 2020-02-02 MED ORDER — IBUPROFEN 600 MG PO TABS
600.0000 mg | ORAL_TABLET | Freq: Four times a day (QID) | ORAL | Status: DC
  Administered 2020-02-02 – 2020-02-03 (×4): 600 mg via ORAL
  Filled 2020-02-02 (×4): qty 1

## 2020-02-02 MED ORDER — WITCH HAZEL-GLYCERIN EX PADS: 1.0000 "application " | MEDICATED_PAD | CUTANEOUS | Status: DC | PRN

## 2020-02-02 MED ORDER — MAGNESIUM HYDROXIDE 400 MG/5ML PO SUSP: 30.0000 mL | ORAL | Status: DC | PRN

## 2020-02-02 MED ORDER — ONDANSETRON HCL 4 MG/2ML IJ SOLN: 4.0000 mg | INTRAMUSCULAR | Status: DC | PRN

## 2020-02-02 MED ORDER — LIDOCAINE HCL (PF) 1 % IJ SOLN
INTRAMUSCULAR | Status: DC | PRN
  Administered 2020-02-01: 7 mL via EPIDURAL

## 2020-02-02 NOTE — Anesthesia Preprocedure Evaluation (Signed)
Anesthesia Evaluation  Patient identified by MRN, date of birth, ID band Patient awake    Reviewed: Allergy & Precautions, NPO status , Patient's Chart, lab work & pertinent test results  Airway Mallampati: II  TM Distance: >3 FB Neck ROM: Full    Dental  (+) Teeth Intact   Pulmonary neg pulmonary ROS,    Pulmonary exam normal        Cardiovascular negative cardio ROS   Rhythm:Regular Rate:Normal     Neuro/Psych Anxiety negative neurological ROS     GI/Hepatic negative GI ROS, Biliary colic   Endo/Other  negative endocrine ROS  Renal/GU negative Renal ROS  negative genitourinary   Musculoskeletal negative musculoskeletal ROS (+)   Abdominal (+)  Abdomen: soft. Bowel sounds: normal.  Peds  Hematology negative hematology ROS (+)   Anesthesia Other Findings   Reproductive/Obstetrics (+) Pregnancy                             Anesthesia Physical Anesthesia Plan  ASA: II  Anesthesia Plan: Epidural   Post-op Pain Management:    Induction:   PONV Risk Score and Plan:   Airway Management Planned: Natural Airway  Additional Equipment: None  Intra-op Plan:   Post-operative Plan:   Informed Consent: I have reviewed the patients History and Physical, chart, labs and discussed the procedure including the risks, benefits and alternatives for the proposed anesthesia with the patient or authorized representative who has indicated his/her understanding and acceptance.     Dental advisory given  Plan Discussed with:   Anesthesia Plan Comments: (Lab Results      Component                Value               Date                      WBC                      7.8                 02/01/2020                HGB                      10.8 (L)            02/01/2020                HCT                      32.2 (L)            02/01/2020                MCV                      86.8                 02/01/2020                PLT                      324                 02/01/2020          )  Anesthesia Quick Evaluation

## 2020-02-02 NOTE — Discharge Summary (Signed)
Postpartum Discharge Summary    Patient Name: Courtney Barnett DOB: [birth year] MRN: [id number]  Date of admission: 02/01/2020 Delivery date:02/02/2020  Delivering provider: Manya Silvas  Date of discharge: 02/03/2020  Admitting diagnosis: Indication for care in labor and delivery, antepartum [O75.9]. Elective induction at term Intrauterine pregnancy: [redacted]w[redacted]d    Secondary diagnosis:  Principal Problem:   Vaginal delivery Active Problems:   Indication for care in labor and delivery, antepartum  Additional problems: None    Discharge diagnosis: Term Pregnancy Delivered                                              Post partum procedures:None Augmentation: Pitocin and Cytotec Complications: None  Hospital course: Induction of Labor With Vaginal Delivery   29y.o. yo G3P1011 at 460w2das admitted to the hospital 02/01/2020 for induction of labor.  Indication for induction: Elective. Induced with Cytotec x 1 and pitocin.  Patient had an uncomplicated labor course as follows: Membrane Rupture Time/Date: 12:56 AM ,02/02/2020   Delivery Method:Vaginal, Spontaneous  Episiotomy: None  Lacerations:  Labial  Details of delivery can be found in separate delivery note.  Patient had an uncomplicated postpartum course. Patient is discharged home 02/03/20.  Newborn Data: Birth date:02/02/2020  Birth time:9:13 AM  Gender:Female  Living status:Living  Apgars:9 ,9  [id number]   Magnesium Sulfate received: No BMZ received: No Rhophylac:N/A MMR:N/A T-DaP:Given prenatally Flu: N/A Transfusion:No  Physical exam  Vitals:   02/02/20 1830 02/02/20 2211 02/03/20 0207 02/03/20 0603  BP: 112/77 102/66 96/72 108/74  Pulse: 61 67 63 (!) 58  Resp: _0 Temp: 98.1 F (36.7 C) 98.1 F (36.7 C) 97.9 F (36.6 C) 98.2 F (36.8 C)  TempSrc: Oral Oral Oral Oral  SpO2: 100% 99% 99% 98%  Weight:      Height:       General: alert, cooperative and no distress Lochia:  appropriate Uterine Fundus: firm Incision: N/A DVT Evaluation: No evidence of DVT seen on physical exam. Labs: Lab Results  Component Value Date   WBC 7.8 02/01/2020   HGB 10.8 (L) 02/01/2020   HCT 32.2 (L) 02/01/2020   MCV 86.8 02/01/2020   PLT 324 02/01/2020   CMP Latest Ref Rng & Units 06/01/2019  Glucose 70 - 99 mg/dL 98  BUN 6 - 20 mg/dL 10  Creatinine 0.44 - 1.00 mg/dL 0.74  Sodium 135 - 145 mmol/L 140  Potassium 3.5 - 5.1 mmol/L 3.9  Chloride 98 - 111 mmol/L 105  CO2 22 - 32 mmol/L 24  Calcium 8.9 - 10.3 mg/dL 9.6  Total Protein 6.5 - 8.1 g/dL 7.0  Total Bilirubin 0.3 - 1.2 mg/dL 1.8(H)  Alkaline Phos 38 - 126 U/L 45  AST 15 - 41 U/L 18  ALT 0 - 44 U/L 18   Edinburgh Score: No flowsheet data found.   After visit meds:  Allergies as of 02/03/2020   No Known Allergies     Medication List    TAKE these medications   acetaminophen 325 MG tablet Commonly known as: Tylenol Take 2 tablets (650 mg total) by mouth every 4 (four) hours as needed for mild pain (for pain scale < 4).   ferrous sulfate 325 (65 FE) MG tablet Take 1 tablet (325 mg total) by mouth every other day.   ibuprofen 600  MG tablet Commonly known as: ADVIL Take 1 tablet (600 mg total) by mouth every 6 (six) hours.   PRENATAL VITAMIN PO Take by mouth.        Discharge home in stable condition Infant Feeding: Bottle and Breast Infant Disposition:home with mother Discharge instruction: per After Visit Summary and Postpartum booklet. Activity: Advance as tolerated. Pelvic rest for 6 weeks.  Diet: routine diet Future Appointments: Future Appointments  Date Time Provider [place]  03/11/2020  1:30 PM Caren Macadam, MD [facility]   Follow up Visit:   Please schedule this patient for a In person postpartum visit in 4 weeks with the following provider: Any provider. Additional Postpartum F/U:None  Low risk pregnancy complicated by: nothing Delivery mode:   Vaginal, Spontaneous  Anticipated Birth Control:  IUD   Mallie Snooks, MSN, CNM Certified Nurse Midwife, [employer] for [employer], [place] 02/03/20 9:01 AM

## 2020-02-02 NOTE — Anesthesia Postprocedure Evaluation (Signed)
Anesthesia Post Note  Patient: Courtney Barnett  Procedure(s) Performed: AN AD HOC LABOR EPIDURAL     Patient location during evaluation: Mother Baby Anesthesia Type: Epidural Level of consciousness: awake and alert Pain management: pain level controlled Vital Signs Assessment: post-procedure vital signs reviewed and stable Respiratory status: spontaneous breathing, nonlabored ventilation and respiratory function stable Cardiovascular status: stable Postop Assessment: no headache, no backache and epidural receding Anesthetic complications: no   No complications documented.  Last Vitals:  Vitals:   02/02/20 1258 02/02/20 1300  BP: 122/72 114/80  Pulse:    Resp: 16 16  Temp: 37 C 36.6 C  SpO2: 99% 99%    Last Pain:  Vitals:   02/02/20 1427  TempSrc:   PainSc: 7    Pain Goal:                   Julie Nay

## 2020-02-02 NOTE — Progress Notes (Signed)
Labor Progress Note Courtney Barnett is a 29 y.o. G3P1011 at [redacted]w[redacted]d presented for eIOL.  S: Pt more comfortable now with epidural in place but still endorsing sharp discomfort in abdomen with contractions rated 5/10. Request for additional bolus via epidural.  O:  BP 118/77   Pulse 83   Temp 98 F (36.7 C) (Oral)   Resp 20   Ht 5\' 3"  (1.6 m)   Wt 70.3 kg   LMP 04/26/2019 (Exact Date)   SpO2 96%   BMI 27.46 kg/m  EFM: baseline 120/moderate variability/+accels, no decels Toco: q3-4 min  CVE: Dilation: 4 Effacement (%): 60 Station: -1 Presentation: Vertex Exam by:: [email or web address], RN   A&P: 29 y.o. 26 [redacted]w[redacted]d presented for eIOL. #Labor: Progressing well. S/p cytotec x1 at 1546. Epidural now in place. Pitocin started at 0014 and SROM for clear fluid at 0056. Will continue to up-titrate pitocin as clinically indicated. #Pain: epidural in place #FWB: Category 1 strip #GBS negative  [redacted]w[redacted]d, MD 4:20 AM

## 2020-02-02 NOTE — Anesthesia Procedure Notes (Signed)
Epidural Patient location during procedure: OB Start time: 02/01/2020 11:25 PM End time: 02/01/2020 11:32 PM  Staffing Anesthesiologist: Atilano Median, DO Performed: anesthesiologist   Preanesthetic Checklist Completed: patient identified, IV checked, site marked, risks and benefits discussed, surgical consent, monitors and equipment checked, pre-op evaluation and timeout performed  Epidural Patient position: sitting Prep: ChloraPrep Patient monitoring: heart rate, continuous pulse ox and blood pressure Approach: midline Location: L3-L4 Injection technique: LOR saline  Needle:  Needle type: Tuohy  Needle gauge: 17 G Needle length: 9 cm Needle insertion depth: 6 cm Catheter type: closed end flexible Catheter size: 20 Guage Catheter at skin depth: 10 cm Test dose: negative and 1.5% lidocaine  Assessment Events: blood not aspirated, injection not painful, no injection resistance and no paresthesia  Additional Notes Patient identified. Risks/Benefits/Options discussed with patient including but not limited to bleeding, infection, nerve damage, paralysis, failed block, incomplete pain control, headache, blood pressure changes, nausea, vomiting, reactions to medications, itching and postpartum back pain. Confirmed with bedside nurse the patient's most recent platelet count. Confirmed with patient that they are not currently taking any anticoagulation, have any bleeding history or any family history of bleeding disorders. Patient expressed understanding and wished to proceed. All questions were answered. Sterile technique was used throughout the entire procedure. Please see nursing notes for vital signs. Test dose was given through epidural catheter and negative prior to continuing to dose epidural or start infusion. Warning signs of high block given to the patient including shortness of breath, tingling/numbness in hands, complete motor block, or any concerning symptoms with  instructions to call for help. Patient was given instructions on fall risk and not to get out of bed. All questions and concerns addressed with instructions to call with any issues or inadequate analgesia.    Reason for block:procedure for pain

## 2020-02-03 MED ORDER — FERROUS SULFATE 325 (65 FE) MG PO TABS: 325.0000 mg | ORAL_TABLET | ORAL | 0 refills | Status: DC

## 2020-02-03 MED ORDER — IBUPROFEN 600 MG PO TABS: 600.0000 mg | ORAL_TABLET | Freq: Four times a day (QID) | ORAL | 0 refills | Status: DC

## 2020-02-03 MED ORDER — ACETAMINOPHEN 325 MG PO TABS: 650.0000 mg | ORAL_TABLET | ORAL | 0 refills | Status: AC | PRN

## 2020-02-03 NOTE — Discharge Instructions (Signed)

## 2020-02-05 ENCOUNTER — Other Ambulatory Visit: Payer: Self-pay | Admitting: *Deleted

## 2020-02-05 MED ORDER — CEPHALEXIN 500 MG PO CAPS: 500.0000 mg | ORAL_CAPSULE | Freq: Two times a day (BID) | ORAL | 0 refills | Status: AC

## 2020-02-14 ENCOUNTER — Ambulatory Visit
Admission: EM | Admit: 2020-02-14 | Discharge: 2020-02-14 | Disposition: A | Discharge status: Home / Self Care | Payer: No Typology Code available for payment source | Attending: Internal Medicine | Admitting: Internal Medicine

## 2020-02-14 DIAGNOSIS — J208 Acute bronchitis due to other specified organisms: Secondary | ICD-10-CM

## 2020-02-14 MED ORDER — PREDNISONE 20 MG PO TABS: 20.0000 mg | ORAL_TABLET | Freq: Every day | ORAL | 0 refills | Status: DC

## 2020-02-14 MED ORDER — ALBUTEROL SULFATE HFA 108 (90 BASE) MCG/ACT IN AERS: 2.0000 | INHALATION_SPRAY | RESPIRATORY_TRACT | 0 refills | Status: DC | PRN

## 2020-02-14 MED ORDER — BENZONATATE 200 MG PO CAPS: 200.0000 mg | ORAL_CAPSULE | Freq: Two times a day (BID) | ORAL | 0 refills | Status: DC | PRN

## 2020-02-14 NOTE — ED Triage Notes (Signed)
Patient presents to Urgent Care with complaints of cough, chest congestion, hoarseness since 12/26. Treating with tylenol and cough drops. Negative covid test a week ago.   Denies Fever, N/V, abdominal pain, or diarrhea.

## 2020-02-14 NOTE — Discharge Instructions (Signed)
Virall illness last up to 10 days and after that you should be almost over it, if you get worse after day 10, you could have developed secondary bacterial infection specially if you are running a fever, so come back.

## 2020-02-14 NOTE — ED Provider Notes (Signed)
[name]    CSN: [id number] Arrival date & time: 02/14/20  6433      History   Chief Complaint Chief Complaint  Patient presents with  . Cough  . Hoarse    Chest congestion x 5 days     HPI Courtney Barnett is a 29 y.o. female who presents with cough, hoarsed, congestion since 12/26. Has been taking Tylenol and cough drops. Has neg covid test one week ago. No fever or GI symptoms.     Past Medical History:  Diagnosis Date  . Anxiety   . Biliary colic 12/13/2016  . GAD (generalized anxiety disorder) 03/19/2018  . Vasovagal syncope     Patient Active Problem List   Diagnosis Date Noted  . Vaginal delivery 02/02/2020  . Indication for care in labor and delivery, antepartum 02/01/2020  . Nausea and vomiting of pregnancy, antepartum 07/16/2019  . Rosacea 07/16/2019  . Supervision of other normal pregnancy, antepartum 07/12/2019  . 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 BREAST SURGERY Left 03/2018   biopsy  . CHOLECYSTECTOMY  12/20/2016   Procedure: LAPAROSCOPIC CHOLECYSTECTOMY WITH INTRAOPERATIVE CHOLANGIOGRAM;  Surgeon: Ancil Linsey, MD;  Location: [facility] ORS;  Service: General;;  . INTRAUTERINE DEVICE (IUD) INSERTION  09/2013  . WISDOM TOOTH EXTRACTION      OB History    Gravida  3   Para  2   Term  2   Preterm      AB  1   Living  2     SAB  1   IAB      Ectopic      Multiple  0   Live Births  2            Home Medications    Prior to Admission medications   Medication Sig Start Date End Date Taking? Authorizing Provider  albuterol (VENTOLIN HFA) 108 (90 Base) MCG/ACT inhaler Inhale 2 puffs into the lungs every 4 (four) hours as needed for wheezing or shortness of breath. 02/14/20  Yes Rodriguez-Southworth, Nettie Elm, PA-C  benzonatate (TESSALON) 200 MG capsule Take 1 capsule (200 mg total) by mouth 2 (two) times daily as needed for cough.  02/14/20  Yes Rodriguez-Southworth, Nettie Elm, PA-C  predniSONE (DELTASONE) 20 MG tablet Take 1 tablet (20 mg total) by mouth daily with breakfast. 02/14/20  Yes Rodriguez-Southworth, Nettie Elm, PA-C  acetaminophen (TYLENOL) 325 MG tablet Take 2 tablets (650 mg total) by mouth every 4 (four) hours as needed for mild pain (for pain scale < 4). 02/03/20 03/04/20  Calvert Cantor, CNM  ferrous sulfate 325 (65 FE) MG tablet Take 1 tablet (325 mg total) by mouth every other day. 02/03/20 03/04/20  Calvert Cantor, CNM  ibuprofen (ADVIL) 600 MG tablet Take 1 tablet (600 mg total) by mouth every 6 (six) hours. 02/03/20   Calvert Cantor, CNM  Prenatal Vit-Fe Fumarate-FA (PRENATAL VITAMIN PO) Take by mouth.    [provider]    Family History Family History  Problem Relation Age of Onset  . Diabetes Maternal Grandfather   . Colon cancer Neg Hx   . Stomach cancer Neg Hx   . Pancreatic cancer Neg Hx     Social History Social History   Tobacco Use  . Smoking status: Never Smoker  . Smokeless tobacco: Never Used  Vaping Use  . Vaping Use: Never used  Substance Use Topics  . Alcohol  use: No  . Drug use: No     Allergies   Patient has no known allergies.   Review of Systems Review of Systems  Constitutional: Negative for activity change, appetite change, chills, diaphoresis and fever.  HENT: Positive for congestion, postnasal drip and voice change. Negative for ear discharge, ear pain and sore throat.   Eyes: Negative for discharge.  Respiratory: Positive for cough. Negative for chest tightness.   Cardiovascular: Negative for chest pain.  Gastrointestinal: Negative for diarrhea, nausea and vomiting.  Musculoskeletal: Negative for gait problem and myalgias.  Skin: Negative for rash.  Hematological: Negative for adenopathy.     Physical Exam Triage Vital Signs ED Triage Vitals  Enc Vitals Group     BP 02/14/20 0826 121/84     Pulse Rate 02/14/20 0826 97      Resp 02/14/20 0826 16     Temp 02/14/20 0826 98.7 F (37.1 C)     Temp Source 02/14/20 0826 Oral     SpO2 02/14/20 0826 97 %     Weight --      Height --      Head Circumference --      Peak Flow --      Pain Score 02/14/20 0827 0     Pain Loc --      Pain Edu? --      Excl. in GC? --    No data found.  Updated Vital Signs BP 121/84 (BP Location: Left Arm)   Pulse 97   Temp 98.7 F (37.1 C) (Oral)   Resp 16   LMP 02/01/2020   SpO2 97%   Visual Acuity Right Eye Distance:   Left Eye Distance:   Bilateral Distance:    Right Eye Near:   Left Eye Near:    Bilateral Near:     Physical Exam Physical Exam Vitals signs and nursing note reviewed.  Constitutional:      General: She is not in acute distress.    Appearance: Normal appearance. She is not ill-appearing, toxic-appearing or diaphoretic.  HENT:     Head: Normocephalic.     Right Ear: Tympanic membrane, ear canal and external ear normal.     Left Ear: Tympanic membrane, ear canal and external ear normal.     Nose: Nose normal.     Mouth/Throat:     Mouth: Mucous membranes are moist.  Eyes:     General: No scleral icterus.       Right eye: No discharge.        Left eye: No discharge.     Conjunctiva/sclera: Conjunctivae normal.  Neck:     Musculoskeletal: Neck supple. No neck rigidity.  Cardiovascular:     Rate and Rhythm: Normal rate and regular rhythm.     Heart sounds: No murmur.  Pulmonary:     Effort: Pulmonary effort is normal.     Breath sounds: has mild wheezing on bases   Musculoskeletal: Normal range of motion.  Lymphadenopathy:     Cervical: No cervical adenopathy.  Skin:    General: Skin is warm and dry.     Coloration: Skin is not jaundiced.     Findings: No rash.  Neurological:     Mental Status: She is alert and oriented to person, place, and time.     Gait: Gait normal.  Psychiatric:        Mood and Affect: Mood normal.        Behavior: Behavior normal.  Thought Content:  Thought content normal.        Judgment: Judgment normal.     UC Treatments / Results  Labs (all labs ordered are listed, but only abnormal results are displayed) Labs Reviewed  NOVEL CORONAVIRUS, NAA    EKG   Radiology No results found.  Procedures Procedures (including critical care time)  Medications Ordered in UC Medications - No data to display  Initial Impression / Assessment and Plan / UC Course  I have reviewed the triage vital signs and the nursing notes. Has viral bronchitis and I placed her on Albuterol inhaler , prednisone and tessalon. See instructions.  Final Clinical Impressions(s) / UC Diagnoses   Final diagnoses:  Viral bronchitis     Discharge Instructions     Virall illness last up to 10 days and after that you should be almost over it, if you get worse after day 10, you could have developed secondary bacterial infection specially if you are running a fever, so come back.     ED Prescriptions    Medication Sig Dispense Auth. Provider   benzonatate (TESSALON) 200 MG capsule Take 1 capsule (200 mg total) by mouth 2 (two) times daily as needed for cough. 30 capsule Rodriguez-Southworth, Nettie Elm, PA-C   albuterol (VENTOLIN HFA) 108 (90 Base) MCG/ACT inhaler Inhale 2 puffs into the lungs every 4 (four) hours as needed for wheezing or shortness of breath. 18 g Rodriguez-Southworth, Nettie Elm, PA-C   predniSONE (DELTASONE) 20 MG tablet Take 1 tablet (20 mg total) by mouth daily with breakfast. 5 tablet Rodriguez-Southworth, Nettie Elm, PA-C     PDMP not reviewed this encounter.   Garey Ham, PA-C 02/16/20 0003

## 2020-02-18 LAB — NOVEL CORONAVIRUS, NAA: SARS-CoV-2, NAA: NOT DETECTED

## 2020-03-11 ENCOUNTER — Ambulatory Visit (INDEPENDENT_AMBULATORY_CARE_PROVIDER_SITE_OTHER): Payer: No Typology Code available for payment source | Admitting: Family Medicine

## 2020-03-11 ENCOUNTER — Other Ambulatory Visit: Payer: Self-pay

## 2020-03-11 DIAGNOSIS — Z30430 Encounter for insertion of intrauterine contraceptive device: Secondary | ICD-10-CM

## 2020-03-11 DIAGNOSIS — Z01812 Encounter for preprocedural laboratory examination: Secondary | ICD-10-CM

## 2020-03-11 LAB — POCT URINE PREGNANCY: Preg Test, Ur: NEGATIVE

## 2020-03-11 MED ORDER — LEVONORGESTREL 20 MCG/24HR IU IUD: INTRAUTERINE_SYSTEM | Freq: Once | INTRAUTERINE | Status: AC

## 2020-03-11 NOTE — Progress Notes (Signed)
Post Partum Visit Note  Courtney Barnett is a 30 y.o. [phone] female who presents for a postpartum visit. She is 6 weeks postpartum following a normal spontaneous vaginal delivery.  I have fully reviewed the prenatal and intrapartum course. The delivery was at 40.2 gestational weeks.  Anesthesia: epidural. Postpartum course has been uncomplicated. Baby is doing well. Baby is feeding by both breast and bottle - [place]. Mainly breast feeding. . Bleeding no bleeding. Bowel function is normal. Bladder function is normal. Patient is not sexually active. Contraception method is IUD. Postpartum depression screening: negative.   The pregnancy intention screening data noted above was reviewed. Potential methods of contraception were discussed. The patient elected to proceed with IUD or IUS.    The following portions of the patient's history were reviewed and updated as appropriate: allergies, current medications, past family history, past medical history, past social history, past surgical history and problem list.  Review of Systems Pertinent items are noted in HPI.    Objective:  BP 110/75   Pulse 76   Wt 133 lb (60.3 kg)   BMI 23.56 kg/m    General:  alert, cooperative and appears stated age   Breasts:  no concern  Lungs: clear to auscultation bilaterally  Heart:  regular rate and rhythm, S1, S2 normal, no murmur, click, rub or gallop  Abdomen: soft, non-tender; bowel sounds normal; no masses,  no organomegaly   Vulva:  normal  Vagina: normal vagina  Cervix:  multiparous appearance  Corpus: normal  Adnexa:  normal adnexa  Rectal Exam: Not performed.        Her GC/CT screening was found to be up to date and using WHO criteria we can be reasonably certain she is not pregnant or a pregnancy test was obtained which was Urine pregnancy test  today was Negative.  See Flowsheet for IUD check list  IUD Insertion Procedure Note Patient identified, informed consent performed, consent  signed.   Discussed risks of irregular bleeding, cramping, infection, malpositioning or misplacement of the IUD outside the uterus which may require further procedure such as laparoscopy. Time out was performed.    Speculum placed in the vagina.  Cervix visualized.  Cleaned with Betadine x 2.  Grasped anteriorly with a single tooth tenaculum.  Uterus sounded to 8 cm.  IUD placed per manufacturer's recommendations.  Strings trimmed to 3 cm. Tenaculum was removed, good hemostasis noted.  Patient tolerated procedure well.   Patient was given post-procedure instructions- both agency handout and verbally by provider.  She was advised to have backup contraception for one week.  Patient was also asked to check IUD strings periodically or follow up in 4 weeks for IUD check.  Assessment:    Normal postpartum exam. Pap smear not done at today's visit.   Plan:   Essential components of care per ACOG recommendations:  1.  Mood and well being: Patient with negative depression screening today. Reviewed local resources for support.  - Patient does not use tobacco.  - hx of drug use? No    2. Infant care and feeding:  -Patient currently breastmilk feeding? Yes. No work note needed. Providing 2-4 oz formula supplement daily. Reviewed importance of draining breast regularly to support lactation. -Social determinants of health (SDOH) reviewed in EPIC. No concerns  3. Sexuality, contraception and birth spacing - Patient does not want a pregnancy in the next year.  Desired family size is 3 children.  - Reviewed forms of contraception in tiered fashion.  Patient desired IUD today.   - Discussed birth spacing of 18 months  4. Sleep and fatigue -Encouraged family/partner/community support of 4 hrs of uninterrupted sleep to help with mood and fatigue  5. Physical Recovery  - Discussed patients delivery and complications - Patient had a labial laceration, perineal healing reviewed. Patient expressed  understanding - Patient has urinary incontinence? No  - Patient is safe to resume physical and sexual activity  6.  Health Maintenance - Last pap smear done 03/2018 and was normal with negative HPV.  7.Chronic Disease - PCP follow up  Scheryl Marten, RN [facility] [employer], [facility]

## 2020-03-12 DIAGNOSIS — Z30430 Encounter for insertion of intrauterine contraceptive device: Secondary | ICD-10-CM

## 2020-03-12 DIAGNOSIS — R509 Fever, unspecified: Secondary | ICD-10-CM

## 2020-03-12 DIAGNOSIS — R102 Pelvic and perineal pain: Secondary | ICD-10-CM

## 2020-03-13 ENCOUNTER — Other Ambulatory Visit: Payer: Self-pay | Admitting: *Deleted

## 2020-03-13 ENCOUNTER — Telehealth: Payer: Self-pay | Admitting: *Deleted

## 2020-03-13 MED ORDER — METRONIDAZOLE 500 MG PO TABS
500.0000 mg | ORAL_TABLET | Freq: Two times a day (BID) | ORAL | 0 refills | Status: AC
Start: 2020-03-13 — End: 2020-03-23

## 2020-03-13 MED ORDER — DOXYCYCLINE HYCLATE 100 MG PO CAPS: 100.0000 mg | ORAL_CAPSULE | Freq: Two times a day (BID) | ORAL | 0 refills | Status: DC

## 2020-03-13 MED ORDER — DOXYCYCLINE HYCLATE 100 MG PO CAPS: 100.0000 mg | ORAL_CAPSULE | Freq: Two times a day (BID) | ORAL | 0 refills | Status: AC

## 2020-03-13 NOTE — Telephone Encounter (Signed)
Called opt to let her know we will send in antibiotic for her and she does need to get Covid test to rule that out and if she is not improved in 24 hrs to go to th ED. Pt verbalizes and understands.

## 2020-04-08 DIAGNOSIS — N61 Mastitis without abscess: Secondary | ICD-10-CM

## 2020-04-08 MED ORDER — DICLOXACILLIN SODIUM 500 MG PO CAPS: 500.0000 mg | ORAL_CAPSULE | Freq: Four times a day (QID) | ORAL | 0 refills | Status: AC

## 2020-04-29 ENCOUNTER — Encounter: Payer: Self-pay | Admitting: Nurse Practitioner

## 2020-04-29 ENCOUNTER — Other Ambulatory Visit: Payer: Self-pay

## 2020-04-29 ENCOUNTER — Ambulatory Visit (INDEPENDENT_AMBULATORY_CARE_PROVIDER_SITE_OTHER): Payer: BC Managed Care – PPO | Admitting: Nurse Practitioner

## 2020-04-29 VITALS — BP 95/66 | HR 97 | Temp 98.6°F | Ht 63.0 in | Wt 129.8 lb

## 2020-04-29 DIAGNOSIS — R5383 Other fatigue: Secondary | ICD-10-CM | POA: Diagnosis not present

## 2020-04-29 DIAGNOSIS — Z7689 Persons encountering health services in other specified circumstances: Secondary | ICD-10-CM | POA: Insufficient documentation

## 2020-04-29 DIAGNOSIS — M159 Polyosteoarthritis, unspecified: Secondary | ICD-10-CM | POA: Diagnosis not present

## 2020-04-29 DIAGNOSIS — D509 Iron deficiency anemia, unspecified: Secondary | ICD-10-CM | POA: Insufficient documentation

## 2020-04-29 NOTE — Progress Notes (Signed)
New Patient Office Visit  Subjective:  Patient ID: Courtney Barnett, female    DOB: 1991-01-15  Age: 30 y.o. MRN: [id number]  CC:  Chief Complaint  Patient presents with  . New Patient (Initial Visit)    HPI Courtney Barnett presents to establish new primary care provider. She has been seen in this office in a few years. She recently gave birth to her second daughter. Courtney Barnett is now three months old. The patient states that she has been dealing with some alarming symptoms for the past several years. She states that symptoms seemed to have started after having mono about 10 years ago. The first thing she noted was a rash across her cheeks and nose. She did see her PCP at the time. Was told that she had eczema and was given a cream to help with itchiness and redness. This does help. Rash comes and goes. The patient states that she also has moderate to severe fatigue. She sometimes runs a low grade fever. She states this has happened two times since the birth of her new baby. COVID 19 tests were negative. The patient states that she will have generalized joint and muscle pain, headaches, and light sensitivity. She states that all symptoms are intermittent, but have become more frequent and more severe since birth of second child. Patient states that her pregnancy was normal. She was negative for COVID 19 and screening for sexually transmitted diseases, including HIV, was negative.  She denies chest pain, chest pressure, or shortness of breath. She sometimes has nausea without vomiting. Her last menstrual period was 04/16/2020. Stats that cycles are not regular since she got Mirena IUD.    Past Medical History:  Diagnosis Date  . Anxiety   . Biliary colic 12/13/2016  . GAD (generalized anxiety disorder) 03/19/2018  . Vasovagal syncope     Past Surgical History:  Procedure Laterality Date  . BREAST SURGERY Left 03/2018   biopsy  . CHOLECYSTECTOMY  12/20/2016   Procedure: LAPAROSCOPIC  CHOLECYSTECTOMY WITH INTRAOPERATIVE CHOLANGIOGRAM;  Surgeon: Ancil Linsey, MD;  Location: [facility] ORS;  Service: General;;  . INTRAUTERINE DEVICE (IUD) INSERTION  09/2013  . WISDOM TOOTH EXTRACTION      Family History  Problem Relation Age of Onset  . Diabetes Maternal Grandfather   . Colon cancer Neg Hx   . Stomach cancer Neg Hx   . Pancreatic cancer Neg Hx     Social History   Socioeconomic History  . Marital status: Married    Spouse name: Not on file  . Number of children: Not on file  . Years of education: Not on file  . Highest education level: Not on file  Occupational History  . Not on file  Tobacco Use  . Smoking status: Never Smoker  . Smokeless tobacco: Never Used  Vaping Use  . Vaping Use: Never used  Substance and Sexual Activity  . Alcohol use: No  . Drug use: No  . Sexual activity: Yes    Partners: Male    Birth control/protection: None  Other Topics Concern  . Not on file  Social History Narrative  . Not on file   Social Determinants of Health   Financial Resource Strain: Not on file  Food Insecurity: Not on file  Transportation Needs: Not on file  Physical Activity: Not on file  Stress: Not on file  Social Connections: Not on file  Intimate Partner Violence: Not on file    ROS Review of Systems  Constitutional: Positive for fatigue and fever. Negative for chills.  HENT: Negative for congestion and sinus pain.   Respiratory: Negative for cough, shortness of breath and wheezing.   Cardiovascular: Negative for chest pain and palpitations.  Gastrointestinal: Negative for constipation, diarrhea, nausea and vomiting.  Endocrine: Negative.   Musculoskeletal: Positive for arthralgias and myalgias. Negative for back pain.       Generalized joint pain and tenderness.  Skin: Negative for rash.  Neurological: Positive for headaches. Negative for dizziness and weakness.  Psychiatric/Behavioral: The patient is nervous/anxious.   All other systems  reviewed and are negative.   Objective:   Today's Vitals   04/29/20 0912  BP: 95/66  Pulse: 97  Temp: 98.6 F (37 C)  SpO2: 100%  Weight: 129 lb 12.8 oz (58.9 kg)  Height: 5\' 3"  (1.6 m)   Body mass index is 22.99 kg/m.   Physical Exam Vitals and nursing note reviewed.  Constitutional:      Appearance: Normal appearance. She is well-developed.  HENT:     Head: Normocephalic.     Right Ear: Ear canal and external ear normal.     Left Ear: Ear canal and external ear normal.     Nose: Nose normal.  Eyes:     Pupils: Pupils are equal, round, and reactive to light.  Cardiovascular:     Rate and Rhythm: Normal rate and regular rhythm.     Pulses: Normal pulses.     Heart sounds: Normal heart sounds.  Pulmonary:     Effort: Pulmonary effort is normal.     Breath sounds: Normal breath sounds.  Abdominal:     General: Bowel sounds are normal.     Palpations: Abdomen is soft.     Tenderness: There is no abdominal tenderness.  Musculoskeletal:        General: Normal range of motion.     Cervical back: Normal range of motion and neck supple.     Comments: There is some mild joint pain present without point tenderness.   Skin:    General: Skin is warm and dry.     Capillary Refill: Capillary refill takes less than 2 seconds.  Neurological:     General: No focal deficit present.     Mental Status: She is alert and oriented to person, place, and time.  Psychiatric:        Mood and Affect: Mood normal.        Behavior: Behavior normal.        Thought Content: Thought content normal.        Judgment: Judgment normal.     Assessment & Plan:  1. Encounter to establish care Patient appointment to establish new priary care provider   2. Other fatigue Check labs including connective tissue and anemia panels. Add vitamin d and HgbA1c for further evaluation.  - ANA w/Reflex if Positive - Rheumatoid factor - Sedimentation rate - CBC with Differential/Platelet - Comprehensive  metabolic panel - TSH + free T4 - Iron, TIBC and Ferritin Panel - Vitamin B12 - Vitamin D 1,25 dihydroxy - Hemoglobin A1c  3. Generalized osteoarthritis Check connective tissue panel. Also check thyroid panel and vitamin d - ANA w/Reflex if Positive - Rheumatoid factor - Sedimentation rate - Comprehensive metabolic panel - TSH + free T4 - Vitamin D 1,25 dihydroxy  4. Iron deficiency anemia, unspecified iron deficiency anemia type Patient iron deficient upon discharge from hospital after having her daughter. Will check CBC and anemia panel for further  evaluation.  - CBC with Differential/Platelet - Iron, TIBC and Ferritin Panel - Vitamin B12  Problem List Items Addressed This Visit      Musculoskeletal and Integument   Generalized osteoarthritis   Relevant Orders   ANA w/Reflex if Positive   Rheumatoid factor   Sedimentation rate   Comprehensive metabolic panel   TSH + free T4   Vitamin D 1,25 dihydroxy     Other   Encounter to establish care - Primary   Other fatigue   Relevant Orders   ANA w/Reflex if Positive   Rheumatoid factor   Sedimentation rate   CBC with Differential/Platelet   Comprehensive metabolic panel   TSH + free T4   Iron, TIBC and Ferritin Panel   Vitamin B12   Vitamin D 1,25 dihydroxy   Hemoglobin A1c   Iron deficiency anemia   Relevant Orders   CBC with Differential/Platelet   Iron, TIBC and Ferritin Panel   Vitamin B12      Outpatient Encounter Medications as of 04/29/2020  Medication Sig  . levonorgestrel (MIRENA) 20 MCG/24HR IUD 1 each by Intrauterine route once.  . ferrous sulfate 325 (65 FE) MG tablet Take 1 tablet (325 mg total) by mouth every other day.  . ibuprofen (ADVIL) 600 MG tablet Take 1 tablet (600 mg total) by mouth every 6 (six) hours.  . Prenatal Vit-Fe Fumarate-FA (PRENATAL VITAMIN PO) Take by mouth.   No facility-administered encounter medications on file as of 04/29/2020.   Time spent with the patient was  approximately 45 minutes. This time included reviewing progress notes, labs, imaging studies, and discussing plan for follow up.   Follow-up: Return in about 6 months (around 10/30/2020) for routine and as needed .   Carlean Jews, NP

## 2020-04-29 NOTE — Patient Instructions (Signed)

## 2020-04-30 NOTE — Progress Notes (Signed)
Waiting on all results

## 2020-05-01 NOTE — Progress Notes (Signed)
Please let the patient know that her labs look good. Her calcium level was just a little elevated. We will check this again at her next visit. Thanks

## 2020-05-08 LAB — TSH+FREE T4
Free T4: 1.33 ng/dL (ref 0.82–1.77)
TSH: 0.759 u[IU]/mL (ref 0.450–4.500)

## 2020-05-08 LAB — CBC WITH DIFFERENTIAL/PLATELET
Basophils Absolute: 0.1 10*3/uL (ref 0.0–0.2)
Basos: 1 %
EOS (ABSOLUTE): 0.2 10*3/uL (ref 0.0–0.4)
Eos: 2 %
Hematocrit: 38.8 % (ref 34.0–46.6)
Hemoglobin: 13.1 g/dL (ref 11.1–15.9)
Immature Grans (Abs): 0 10*3/uL (ref 0.0–0.1)
Immature Granulocytes: 0 %
Lymphocytes Absolute: 1.4 10*3/uL (ref 0.7–3.1)
Lymphs: 20 %
MCH: 28.7 pg (ref 26.6–33.0)
MCHC: 33.8 g/dL (ref 31.5–35.7)
MCV: 85 fL (ref 79–97)
Monocytes Absolute: 0.3 10*3/uL (ref 0.1–0.9)
Monocytes: 4 %
Neutrophils Absolute: 5.1 10*3/uL (ref 1.4–7.0)
Neutrophils: 73 %
Platelets: 367 10*3/uL (ref 150–450)
RBC: 4.56 x10E6/uL (ref 3.77–5.28)
RDW: 13.8 % (ref 11.7–15.4)
WBC: 7 10*3/uL (ref 3.4–10.8)

## 2020-05-08 LAB — COMPREHENSIVE METABOLIC PANEL
ALT: 14 IU/L (ref 0–32)
AST: 20 IU/L (ref 0–40)
Albumin/Globulin Ratio: 2 (ref 1.2–2.2)
Albumin: 5.1 g/dL — ABNORMAL HIGH (ref 3.9–5.0)
Alkaline Phosphatase: 87 IU/L (ref 44–121)
BUN/Creatinine Ratio: 17 (ref 9–23)
BUN: 13 mg/dL (ref 6–20)
Bilirubin Total: 1.2 mg/dL (ref 0.0–1.2)
CO2: 21 mmol/L (ref 20–29)
Calcium: 10.3 mg/dL — ABNORMAL HIGH (ref 8.7–10.2)
Chloride: 102 mmol/L (ref 96–106)
Creatinine, Ser: 0.78 mg/dL (ref 0.57–1.00)
Globulin, Total: 2.5 g/dL (ref 1.5–4.5)
Glucose: 70 mg/dL (ref 65–99)
Potassium: 4.3 mmol/L (ref 3.5–5.2)
Sodium: 141 mmol/L (ref 134–144)
Total Protein: 7.6 g/dL (ref 6.0–8.5)
eGFR: 106 mL/min/{1.73_m2} (ref >59)

## 2020-05-08 LAB — VITAMIN D 1,25 DIHYDROXY
Vitamin D 1, 25 (OH)2 Total: 43 pg/mL
Vitamin D2 1, 25 (OH)2: <10 pg/mL
Vitamin D3 1, 25 (OH)2: 42 pg/mL

## 2020-05-08 LAB — RHEUMATOID FACTOR: Rheumatoid fact SerPl-aCnc: <10 IU/mL (ref <14.0)

## 2020-05-08 LAB — IRON,TIBC AND FERRITIN PANEL
Ferritin: 38 ng/mL (ref 15–150)
Iron Saturation: 26 % (ref 15–55)
Iron: 90 ug/dL (ref 27–159)
Total Iron Binding Capacity: 350 ug/dL (ref 250–450)
UIBC: 260 ug/dL (ref 131–425)

## 2020-05-08 LAB — VITAMIN B12: Vitamin B-12: 1075 pg/mL (ref 232–1245)

## 2020-05-08 LAB — HEMOGLOBIN A1C
Est. average glucose Bld gHb Est-mCnc: 97 mg/dL
Hgb A1c MFr Bld: 5 % (ref 4.8–5.6)

## 2020-05-08 LAB — SEDIMENTATION RATE: Sed Rate: 2 mm/hr (ref 0–32)

## 2020-05-08 LAB — ANA W/REFLEX IF POSITIVE: Anti Nuclear Antibody (ANA): NEGATIVE

## 2020-10-22 ENCOUNTER — Encounter: Payer: Self-pay | Admitting: Nurse Practitioner

## 2020-10-23 ENCOUNTER — Ambulatory Visit (INDEPENDENT_AMBULATORY_CARE_PROVIDER_SITE_OTHER): Payer: 59 | Admitting: Nurse Practitioner

## 2020-10-23 ENCOUNTER — Other Ambulatory Visit: Payer: Self-pay

## 2020-10-23 ENCOUNTER — Encounter: Payer: Self-pay | Admitting: Nurse Practitioner

## 2020-10-23 VITALS — Ht 63.0 in | Wt 129.8 lb

## 2020-10-23 DIAGNOSIS — U099 Post covid-19 condition, unspecified: Secondary | ICD-10-CM

## 2020-10-23 DIAGNOSIS — J300 Vasomotor rhinitis: Secondary | ICD-10-CM

## 2020-10-23 MED ORDER — METHYLPREDNISOLONE 4 MG PO TBPK: ORAL_TABLET | ORAL | 0 refills | Status: DC

## 2020-10-23 MED ORDER — FLUTICASONE PROPIONATE 50 MCG/ACT NA SUSP: 2.0000 | Freq: Every day | NASAL | 6 refills | Status: DC

## 2020-10-23 NOTE — Progress Notes (Signed)
Virtual Visit via Telephone Note  I connected with Courtney Barnett on 11/04/20 at  9:00 AM EDT by telephone and verified that I am speaking with the correct person using two identifiers.  Location: Patient: home Provider: [facility]     I discussed the limitations, risks, security and privacy concerns of performing an evaluation and management service by telephone and the availability of in person appointments. I also discussed with the patient that there may be a patient responsible charge related to this service. The patient expressed understanding and agreed to proceed.   History of Present Illness: The patient presents for acute visit.  She states she started having upper respiratory symptoms in the beginning of September.  She tested positive for COVID on 10/16/2020.  She states that most of the major symptoms are gone which include diarrhea, fever, and vomiting.  She states she is still having congestion both nasally and in the chest.  She has severe fatigue and brain fog.  She is reporting a cough.  Cough is dry.  She does not feel like she is wheezing she does not feel short of breath.  States she is sleeping okay.  Taking Tylenol along with Tylenol cold and flu.  She states she does get some relief from acute symptoms after taking medications.  Symptoms do come right back when the medication wears off.   Observations/Objective:  The patient is alert and oriented. She is pleasant and answers all questions appropriately. Breathing is non-labored. She is in no acute distress at this time.  The patient is nasally congested.  She does have a dry, nonproductive cough which can be heard during today's visit.  Today's Vitals   10/23/20 0811  Weight: 129 lb 12.8 oz (58.9 kg)  Height: 5\' 3"  (1.6 m)   Body mass index is 22.99 kg/m.   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  Take as directed for 6 days.  Add Flonase nasal spray.  2  sprays in both nostrils daily.  Continue taking Tylenol and or Tylenol sinus for acute symptoms.  Rest and increase fluids.  Encouraged her to contact the office if symptoms begin to worsen or fever develops and does not come down with over-the-counter.  She voiced understanding and agreement. - fluticasone (FLONASE) 50 MCG/ACT nasal spray; Place 2 sprays into both nostrils daily.  Dispense: 16 g; Refill: 6 - methylPREDNISolone (MEDROL) 4 MG TBPK tablet; Take by mouth as directed for 6 days  Dispense: 21 tablet; Refill: 0  2. Vasomotor rhinitis Add Flonase nasal spray.  Use 2 sprays in both nostrils.  Follow Up Instructions:  This note was dictated using . Rapid proofreading was performed to expedite the delivery of the information. Despite proofreading, phonetic errors will occur which are common with this voice recognition software. Please take this into consideration. If there are any concerns, please contact our office.     I discussed the assessment and treatment plan with the patient. The patient was provided an opportunity to ask questions and all were answered. The patient agreed with the plan and demonstrated an understanding of the instructions.   The patient was advised to call back or seek an in-person evaluation if the symptoms worsen or if the condition fails to improve as anticipated.  I provided 10 minutes of non-face-to-face time during this encounter.   Conservation officer, historic buildings, NP

## 2020-11-04 DIAGNOSIS — J300 Vasomotor rhinitis: Secondary | ICD-10-CM | POA: Insufficient documentation

## 2020-11-04 DIAGNOSIS — U099 Post covid-19 condition, unspecified: Secondary | ICD-10-CM | POA: Insufficient documentation

## 2021-07-10 IMAGING — US US OB < 14 WEEKS - US OB TV
1 series · 15 of 28 positions shown · non-contrast
Comparison: None.

CLINICAL DATA: Pregnant patient with vaginal bleeding.

EXAM:
OBSTETRIC <14 WK US AND TRANSVAGINAL OB US
TECHNIQUE: Both transabdominal and transvaginal ultrasound examinations were
performed for complete evaluation of the gestation as well as the
maternal uterus, adnexal regions, and pelvic cul-de-sac.
Transvaginal technique was performed to assess early pregnancy.

[Series 1: us ob < 14 weeks - us ob tv · 58 acquisitions, 15 frames shown]
[im 1/58]
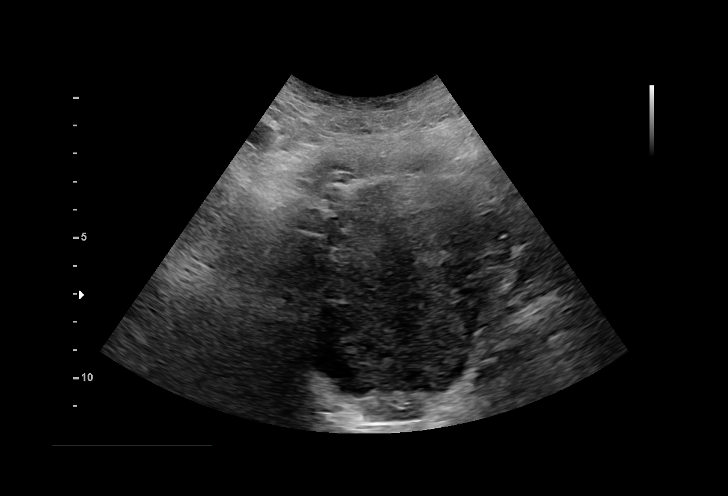
[im 5/58]
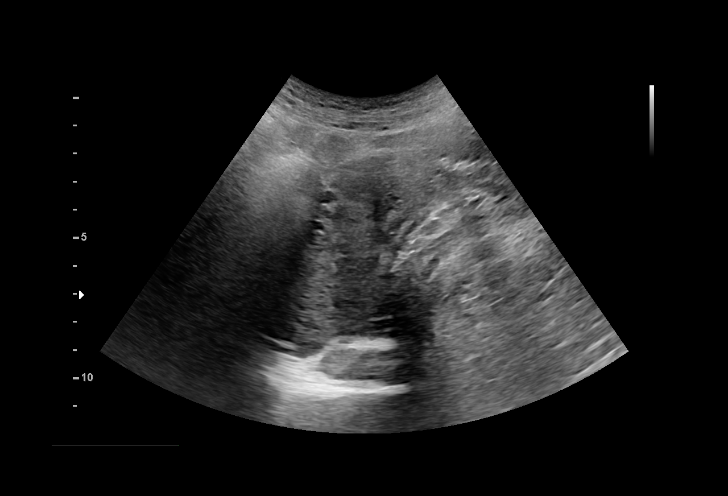
[im 9/58]
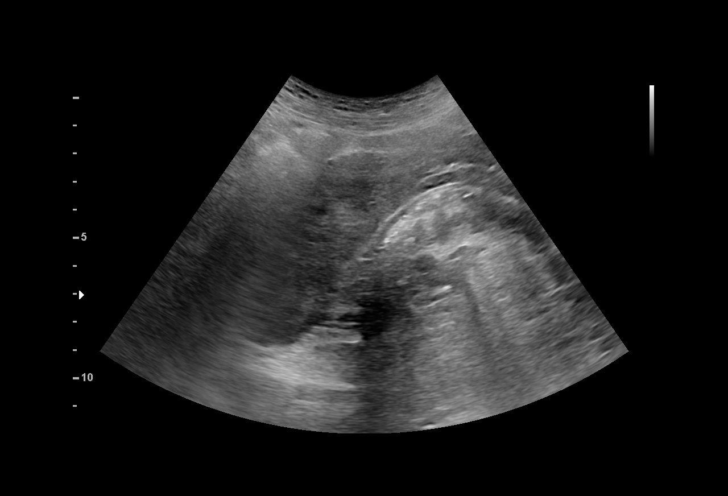
[im 13/58]
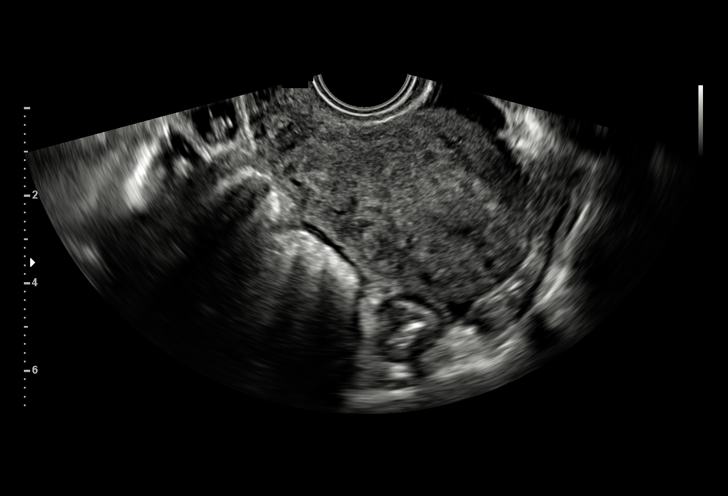
[im 17/58]
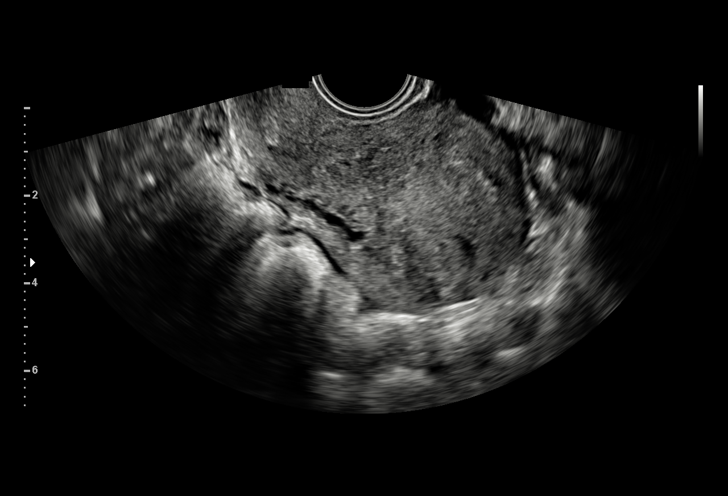
[im 22/58]
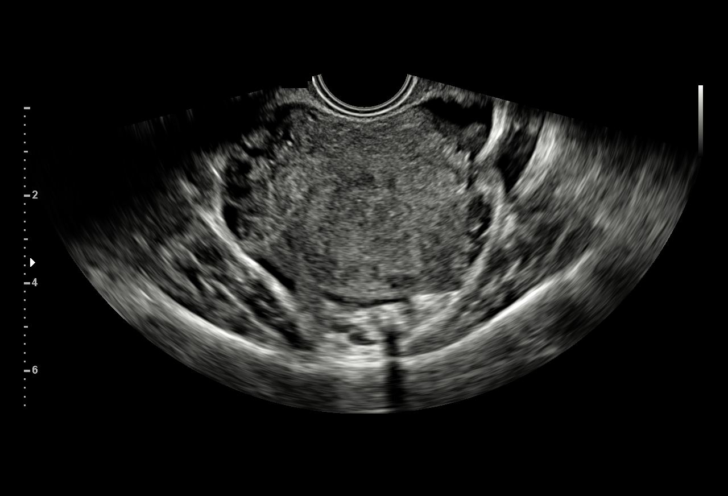
[im 26/58]
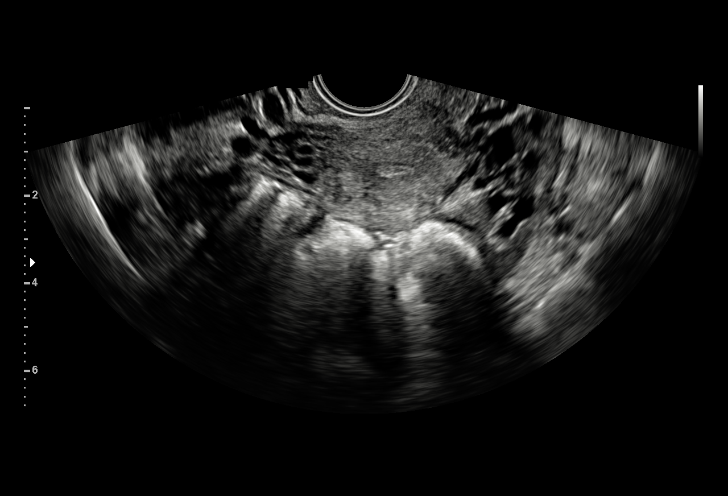
[im 30/58]
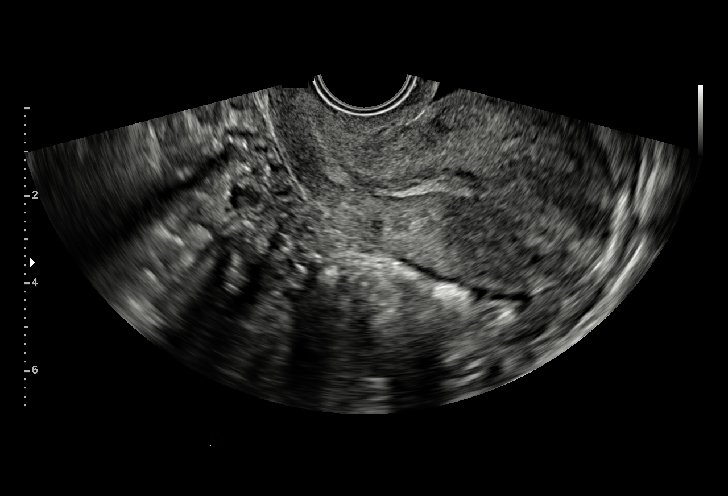
[im 32/58]
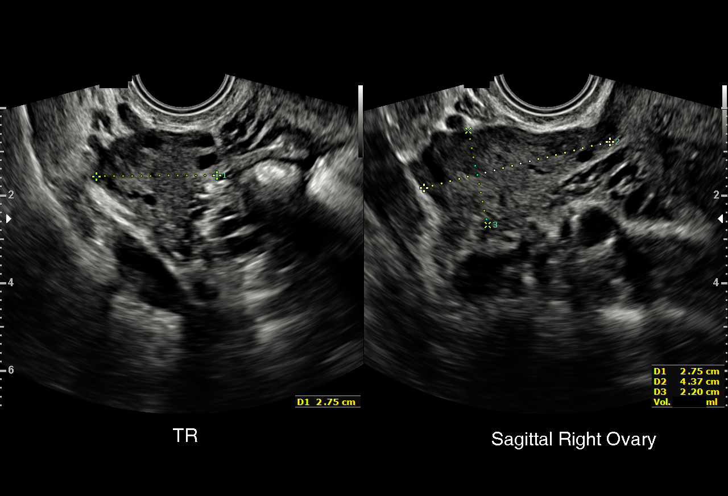
[im 36/58]
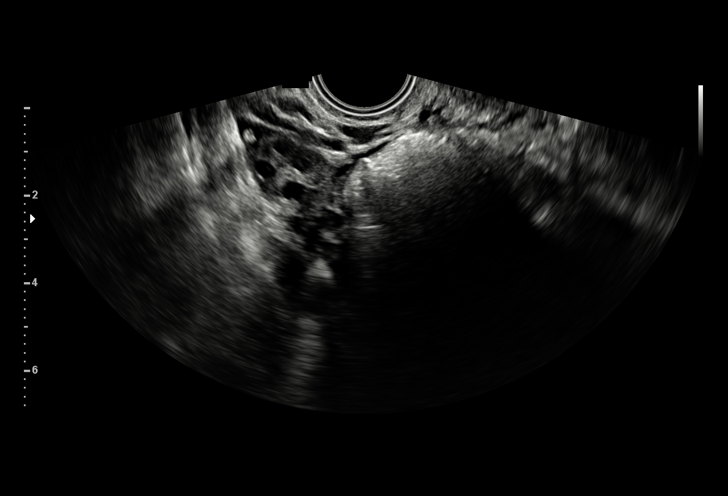
[im 41/58]
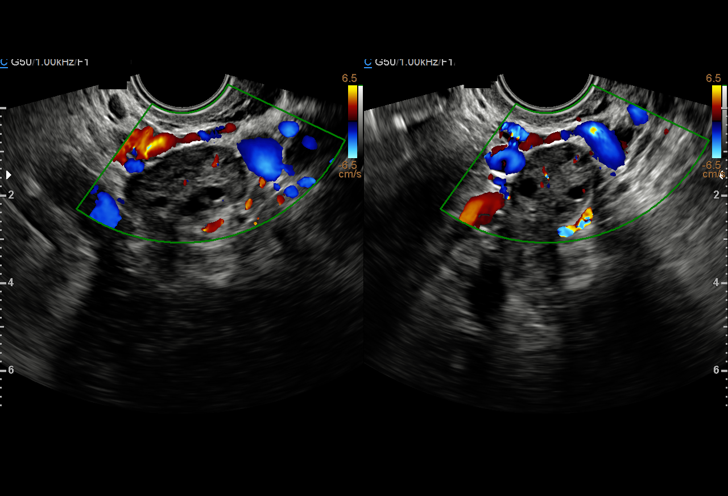
[im 45/58]
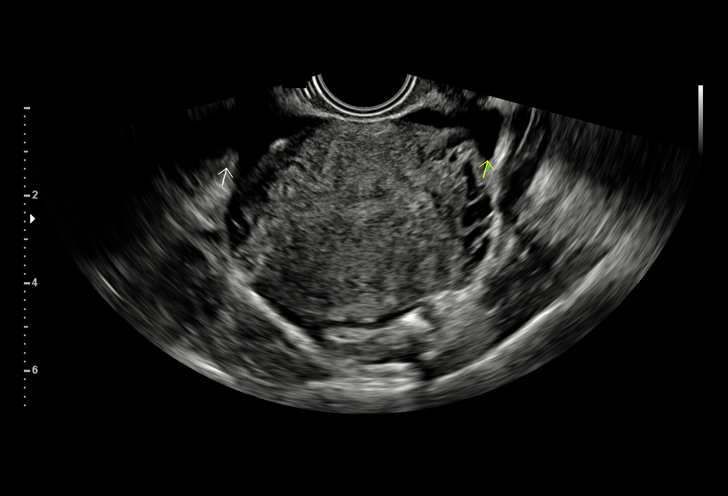
[im 49/58]
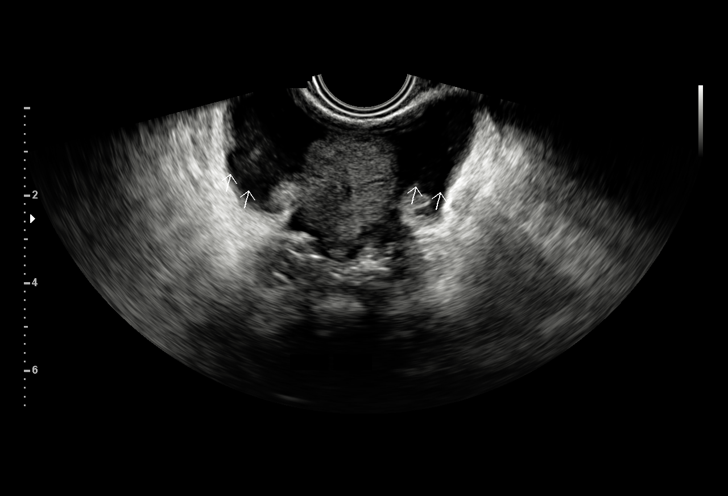
[im 53/58]
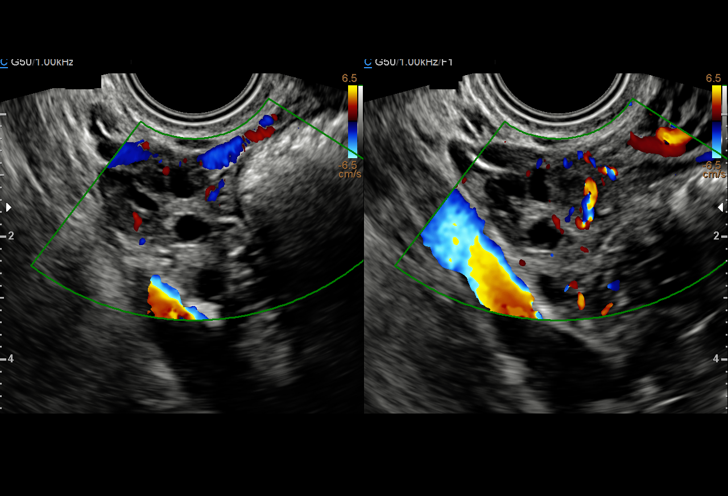
[im 58/58]
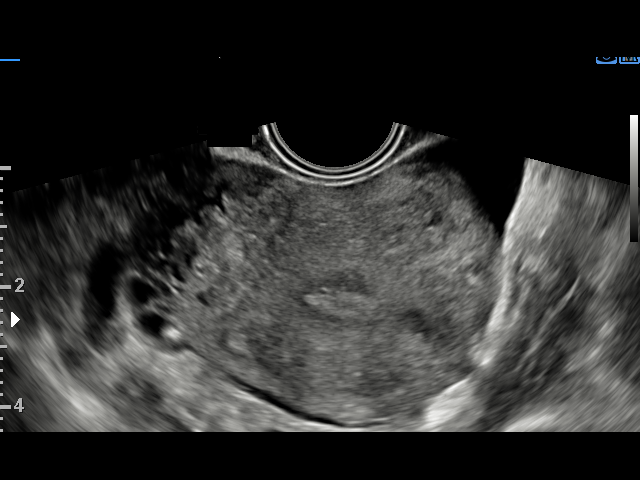

[15 of 28 positions shown; findings below may reference images not displayed]

FINDINGS: Intrauterine gestational sac: None

Yolk sac:  Not Visualized.

Embryo:  Not Visualized.

Cardiac Activity: Not Visualized.

Maternal uterus/adnexae: Multiple follicles within the right and
left ovary. Moderate volume fluid within the pelvis. No definite
adnexal mass identified separate from the right or left ovary.
IMPRESSION: No intrauterine gestation identified. In the setting of positive
pregnancy test and no definite intrauterine pregnancy, this reflects
a pregnancy of unknown location. Differential considerations include
early normal IUP, abnormal IUP, or nonvisualized ectopic pregnancy.
Differentiation is achieved with serial beta HCG supplemented by
repeat sonography as clinically warranted.

Moderate volume free fluid in the pelvis.

## 2021-10-22 ENCOUNTER — Telehealth: Payer: Self-pay | Admitting: Nurse Practitioner

## 2021-10-22 NOTE — Telephone Encounter (Signed)
Called pt she was advised to make an appt for a office visit pt hasn't been here over a year

## 2021-10-22 NOTE — Telephone Encounter (Signed)
Patient called asking about vaccination records and asking if we give a specific vaccine. Patient is requesting a call back.

## 2021-11-08 NOTE — Progress Notes (Signed)
Complete physical exam   Patient: Courtney Barnett   DOB: 05-09-1990   31 y.o. Female  MRN: [id number] Visit Date: 11/09/2021    Chief Complaint  Patient presents with   Annual Exam   Subjective    Courtney Barnett is a 31 y.o. female who presents today for a complete physical exam.  She reports consuming a  generally healthy  diet. The patient does not participate in regular exercise at present. She generally feels well. She does not have additional problems to discuss today.   HPI  Annual physical  -unsure about pap smear. Has seen GYN in the past.  -has not been in office for over a year.  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 She needs to have basic physical and Quantiferon Gold test for TB and Varicella Titer.    Past Medical History:  Diagnosis Date   Anxiety    Biliary colic 37/11/6267   GAD (generalized anxiety disorder) 03/19/2018   Vasovagal syncope    Past Surgical History:  Procedure Laterality Date   BREAST SURGERY Left 03/2018   biopsy   CHOLECYSTECTOMY  12/20/2016   Procedure: LAPAROSCOPIC CHOLECYSTECTOMY WITH INTRAOPERATIVE CHOLANGIOGRAM;  Surgeon: Vickie Epley, MD;  Location: [facility] ORS;  Service: General;;   INTRAUTERINE DEVICE (IUD) INSERTION  09/2013   WISDOM TOOTH EXTRACTION     Social History   Socioeconomic History   Marital status: Married    Spouse name: Not on file   Number of children: Not on file   Years of education: Not on file   Highest education level: Not on file  Occupational History   Not on file  Tobacco Use   Smoking status: Never   Smokeless tobacco: Never  Vaping Use   Vaping Use: Never used  Substance and Sexual Activity   Alcohol use: No   Drug use: No   Sexual activity: Yes    Partners: Male    Birth control/protection: None  Other Topics Concern   Not on file  Social History Narrative   Not on file   Social Determinants of Health    Financial Resource Strain: Not on file  Food Insecurity: Not on file  Transportation Needs: Not on file  Physical Activity: Not on file  Stress: Not on file  Social Connections: Not on file  Intimate Partner Violence: Not on file   Family Status  Relation Name Status   Mother  Alive   Father  Alive   Brother  Alive   MGM  Alive   MGF  Deceased   PGM  Deceased   PGF  Deceased   Brother  Alive   Brother  Alive   Neg Hx  (Not Specified)   Family History  Problem Relation Age of Onset   Diabetes Maternal Grandfather    Colon cancer Neg Hx    Stomach cancer Neg Hx    Pancreatic cancer Neg Hx    No Known Allergies  Patient Care Team: Ronnell Freshwater, NP as PCP - General (Family Medicine)   Medications: Outpatient Medications Prior to Visit  Medication Sig   levonorgestrel (MIRENA) 20 MCG/24HR IUD 1 each by Intrauterine route once.   [DISCONTINUED] fluticasone (FLONASE) 50 MCG/ACT nasal spray Place 2 sprays into both nostrils daily.   [DISCONTINUED] methylPREDNISolone (MEDROL) 4 MG TBPK tablet Take by mouth as directed for 6 days   No facility-administered medications prior to visit.    Review of  Systems  Constitutional:  Negative for activity change, appetite change, chills, fatigue and fever.  HENT:  Negative for congestion, postnasal drip, rhinorrhea, sinus pressure, sinus pain, sneezing and sore throat.   Eyes: Negative.   Respiratory:  Negative for cough, chest tightness, shortness of breath and wheezing.   Cardiovascular:  Negative for chest pain and palpitations.  Gastrointestinal:  Negative for abdominal pain, constipation, diarrhea, nausea and vomiting.  Endocrine: Negative for cold intolerance, heat intolerance, polydipsia and polyuria.  Genitourinary:  Negative for dyspareunia, dysuria, flank pain, frequency and urgency.  Musculoskeletal:  Negative for arthralgias, back pain and myalgias.  Skin:  Negative for rash.  Allergic/Immunologic: Negative for  environmental allergies.  Neurological:  Negative for dizziness, weakness and headaches.  Hematological:  Negative for adenopathy.  Psychiatric/Behavioral:  The patient is not nervous/anxious.        Objective  Today's Vitals   11/09/21 1110  BP: 94/62  Pulse: 62  SpO2: 97%  Weight: 112 lb (50.8 kg)   Body mass index is 19.84 kg/m.   BP Readings from Last 3 Encounters:  11/09/21 94/62  04/29/20 95/66  03/11/20 110/75    Wt Readings from Last 3 Encounters:  11/09/21 112 lb (50.8 kg)  10/23/20 129 lb 12.8 oz (58.9 kg)  04/29/20 129 lb 12.8 oz (58.9 kg)     Physical Exam Vitals and nursing note reviewed.  Constitutional:      Appearance: Normal appearance. She is well-developed.  HENT:     Head: Normocephalic and atraumatic.     Right Ear: Tympanic membrane, ear canal and external ear normal.     Left Ear: Tympanic membrane, ear canal and external ear normal.     Nose: Nose normal.     Mouth/Throat:     Mouth: Mucous membranes are moist.     Pharynx: Oropharynx is clear.  Eyes:     Extraocular Movements: Extraocular movements intact.     Conjunctiva/sclera: Conjunctivae normal.     Pupils: Pupils are equal, round, and reactive to light.  Cardiovascular:     Rate and Rhythm: Normal rate and regular rhythm.     Pulses: Normal pulses.     Heart sounds: Normal heart sounds.  Pulmonary:     Effort: Pulmonary effort is normal.     Breath sounds: Normal breath sounds.  Abdominal:     General: Bowel sounds are normal. There is no distension.     Palpations: Abdomen is soft. There is no mass.     Tenderness: There is no abdominal tenderness. There is no right CVA tenderness, left CVA tenderness, guarding or rebound.     Hernia: No hernia is present.  Musculoskeletal:        General: Normal range of motion.     Cervical back: Normal range of motion and neck supple.  Lymphadenopathy:     Cervical: No cervical adenopathy.  Skin:    General: Skin is warm and dry.      Capillary Refill: Capillary refill takes less than 2 seconds.  Neurological:     General: No focal deficit present.     Mental Status: She is alert and oriented to person, place, and time.  Psychiatric:        Mood and Affect: Mood normal.        Behavior: Behavior normal.        Thought Content: Thought content normal.        Judgment: Judgment normal.      Last depression screening scores  Row Labels 11/09/2021   11:13 AM 10/23/2020    8:13 AM 04/29/2020    9:17 AM  PHQ 2/9 Scores   Section Header. No data exists in this row.     PHQ - 2 Score   0 0 0  PHQ- 9 Score   0 2 3   Last fall risk screening   Row Labels 10/23/2020    8:14 AM  Fall Risk    Section Header. No data exists in this row.   Falls in the past year?   0  Number falls in past yr:   0  Injury with Fall?   0  Follow up   Falls evaluation completed    Results for orders placed or performed in visit on 11/09/21  TSH + free T4  Result Value Ref Range   TSH 1.230 0.450 - 4.500 uIU/mL   Free T4 1.25 0.82 - 1.77 ng/dL  VITAMIN D 25 Hydroxy (Vit-D Deficiency, Fractures)  Result Value Ref Range   Vit D, 25-Hydroxy 44.1 30.0 - 100.0 ng/mL  Hemoglobin A1c  Result Value Ref Range   Hgb A1c MFr Bld 5.2 4.8 - 5.6 %   Est. average glucose Bld gHb Est-mCnc 103 mg/dL  Comprehensive metabolic panel  Result Value Ref Range   Glucose 86 70 - 99 mg/dL   BUN 12 6 - 20 mg/dL   Creatinine, Ser 0.75 0.57 - 1.00 mg/dL   eGFR 110 >59 mL/min/1.73   BUN/Creatinine Ratio 16 9 - 23   Sodium 140 134 - 144 mmol/L   Potassium 4.3 3.5 - 5.2 mmol/L   Chloride 101 96 - 106 mmol/L   CO2 23 20 - 29 mmol/L   Calcium 10.1 8.7 - 10.2 mg/dL   Total Protein 7.3 6.0 - 8.5 g/dL   Albumin 5.1 (H) 4.0 - 5.0 g/dL   Globulin, Total 2.2 1.5 - 4.5 g/dL   Albumin/Globulin Ratio 2.3 (H) 1.2 - 2.2   Bilirubin Total 1.7 (H) 0.0 - 1.2 mg/dL   Alkaline Phosphatase 88 44 - 121 IU/L   AST 18 0 - 40 IU/L   ALT 19 0 - 32 IU/L  CBC  Result Value Ref  Range   WBC 6.2 3.4 - 10.8 x10E3/uL   RBC 4.61 3.77 - 5.28 x10E6/uL   Hemoglobin 13.7 11.1 - 15.9 g/dL   Hematocrit 40.0 34.0 - 46.6 %   MCV 87 79 - 97 fL   MCH 29.7 26.6 - 33.0 pg   MCHC 34.3 31.5 - 35.7 g/dL   RDW 12.1 11.7 - 15.4 %   Platelets 323 150 - 450 x10E3/uL  Varicella zoster antibody, IgG  Result Value Ref Range   Varicella zoster IgG 419 Immune >165 index  QuantiFERON-TB Gold Plus  Result Value Ref Range   QuantiFERON Incubation Incubation performed.    QuantiFERON Criteria Comment    QuantiFERON TB1 Ag Value 0.00 IU/mL   QuantiFERON TB2 Ag Value 0.00 IU/mL   QuantiFERON Nil Value 0.01 IU/mL   QuantiFERON Mitogen Value >10.00 IU/mL   QuantiFERON-TB Gold Plus Negative Negative    Assessment & Plan    1. Encounter for general adult medical examination with abnormal findings Annual physical today.  2. School physical exam Will complete and return school physical forms after lab results available to review.  3. Other fatigue Check thyroid panel for further evaluation. - TSH + free T4  4. Vitamin D deficiency Check vitamin D level and treat deficiency as indicated. - VITAMIN D  25 Hydroxy (Vit-D Deficiency, Fractures)  5. History of varicella as a child Varicella titer for school evaluation. - Varicella zoster antibody, IgG  6. Screening for tuberculosis QuantiFERON gold test ordered for screening for pulmonary tuberculosis. - QuantiFERON-TB Gold Plus  7. Healthcare maintenance Routine, fasting labs drawn during today's visit.  - Hemoglobin A1c - Comprehensive metabolic panel - CBC    Immunization History  Administered Date(s) Administered   DTaP 02/14/2013   PFIZER(Purple Top)SARS-COV-2 Vaccination 11/06/2019, 10/15/2020   Tdap 11/27/2019    Health Maintenance  Topic Date Due   COVID-19 Vaccine (3 - Pfizer series) 12/10/2020   PAP SMEAR-Modifier  03/30/2021   INFLUENZA VACCINE  Never done   TETANUS/TDAP  11/26/2029   Hepatitis C Screening   Completed   HIV Screening  Completed   HPV VACCINES  Aged Out    Discussed health benefits of physical activity, and encouraged her to engage in regular exercise appropriate for her age and condition.  Problem List Items Addressed This Visit       Other   Other fatigue   Relevant Orders   TSH + free T4 (Completed)   Other Visit Diagnoses     Encounter for general adult medical examination with abnormal findings    -  Primary   School physical exam       Vitamin D deficiency       Relevant Orders   VITAMIN D 25 Hydroxy (Vit-D Deficiency, Fractures) (Completed)   History of varicella as a child       Relevant Orders   Varicella zoster antibody, IgG (Completed)   Screening for tuberculosis       Relevant Orders   QuantiFERON-TB Gold Plus (Completed)   Healthcare maintenance       Relevant Orders   Hemoglobin A1c (Completed)   Comprehensive metabolic panel (Completed)   CBC (Completed)        Return in about 1 year (around 11/10/2022) for health maintenance exam.        Ronnell Freshwater, NP  [facility] [phone] (phone) [phone] (fax)  [place]

## 2021-11-09 ENCOUNTER — Encounter: Payer: Self-pay | Admitting: Nurse Practitioner

## 2021-11-09 ENCOUNTER — Ambulatory Visit (INDEPENDENT_AMBULATORY_CARE_PROVIDER_SITE_OTHER): Payer: 59 | Admitting: Nurse Practitioner

## 2021-11-09 VITALS — BP 94/62 | HR 62 | Ht 63.0 in | Wt 112.0 lb

## 2021-11-09 DIAGNOSIS — E559 Vitamin D deficiency, unspecified: Secondary | ICD-10-CM | POA: Diagnosis not present

## 2021-11-09 DIAGNOSIS — Z8619 Personal history of other infectious and parasitic diseases: Secondary | ICD-10-CM

## 2021-11-09 DIAGNOSIS — Z0001 Encounter for general adult medical examination with abnormal findings: Secondary | ICD-10-CM

## 2021-11-09 DIAGNOSIS — Z020 Encounter for examination for admission to educational institution: Secondary | ICD-10-CM

## 2021-11-09 DIAGNOSIS — R5383 Other fatigue: Secondary | ICD-10-CM

## 2021-11-09 DIAGNOSIS — Z111 Encounter for screening for respiratory tuberculosis: Secondary | ICD-10-CM

## 2021-11-09 DIAGNOSIS — Z0000 Encounter for general adult medical examination without abnormal findings: Secondary | ICD-10-CM

## 2021-11-14 LAB — COMPREHENSIVE METABOLIC PANEL
ALT: 19 IU/L (ref 0–32)
AST: 18 IU/L (ref 0–40)
Albumin/Globulin Ratio: 2.3 — ABNORMAL HIGH (ref 1.2–2.2)
Albumin: 5.1 g/dL — ABNORMAL HIGH (ref 4.0–5.0)
Alkaline Phosphatase: 88 IU/L (ref 44–121)
BUN/Creatinine Ratio: 16 (ref 9–23)
BUN: 12 mg/dL (ref 6–20)
Bilirubin Total: 1.7 mg/dL — ABNORMAL HIGH (ref 0.0–1.2)
CO2: 23 mmol/L (ref 20–29)
Calcium: 10.1 mg/dL (ref 8.7–10.2)
Chloride: 101 mmol/L (ref 96–106)
Creatinine, Ser: 0.75 mg/dL (ref 0.57–1.00)
Globulin, Total: 2.2 g/dL (ref 1.5–4.5)
Glucose: 86 mg/dL (ref 70–99)
Potassium: 4.3 mmol/L (ref 3.5–5.2)
Sodium: 140 mmol/L (ref 134–144)
Total Protein: 7.3 g/dL (ref 6.0–8.5)
eGFR: 110 mL/min/{1.73_m2} (ref >59)

## 2021-11-14 LAB — QUANTIFERON-TB GOLD PLUS
QuantiFERON Mitogen Value: >10 IU/mL
QuantiFERON Nil Value: 0.01 IU/mL
QuantiFERON TB1 Ag Value: 0 IU/mL
QuantiFERON TB2 Ag Value: 0 IU/mL
QuantiFERON-TB Gold Plus: NEGATIVE

## 2021-11-14 LAB — CBC
Hematocrit: 40 % (ref 34.0–46.6)
Hemoglobin: 13.7 g/dL (ref 11.1–15.9)
MCH: 29.7 pg (ref 26.6–33.0)
MCHC: 34.3 g/dL (ref 31.5–35.7)
MCV: 87 fL (ref 79–97)
Platelets: 323 10*3/uL (ref 150–450)
RBC: 4.61 x10E6/uL (ref 3.77–5.28)
RDW: 12.1 % (ref 11.7–15.4)
WBC: 6.2 10*3/uL (ref 3.4–10.8)

## 2021-11-14 LAB — HEMOGLOBIN A1C
Est. average glucose Bld gHb Est-mCnc: 103 mg/dL
Hgb A1c MFr Bld: 5.2 % (ref 4.8–5.6)

## 2021-11-14 LAB — TSH+FREE T4
Free T4: 1.25 ng/dL (ref 0.82–1.77)
TSH: 1.23 u[IU]/mL (ref 0.450–4.500)

## 2021-11-14 LAB — VITAMIN D 25 HYDROXY (VIT D DEFICIENCY, FRACTURES): Vit D, 25-Hydroxy: 44.1 ng/mL (ref 30.0–100.0)

## 2021-11-14 LAB — VARICELLA ZOSTER ANTIBODY, IGG: Varicella zoster IgG: 419 index (ref >165)

## 2021-11-15 ENCOUNTER — Telehealth: Payer: Self-pay

## 2021-11-15 NOTE — Progress Notes (Signed)
Please let the patient know that her labs are back. All look good. TB screening was negative. I have completed her physical form and attached her lab results to it. She can pick up at anytime.  Thanks so much.   -HB

## 2021-11-15 NOTE — Telephone Encounter (Signed)
Called pt she is advised that her paper work is  ready for pick up

## 2022-04-29 ENCOUNTER — Ambulatory Visit
Admission: EM | Admit: 2022-04-29 | Discharge: 2022-04-29 | Disposition: A | Discharge status: Home / Self Care | Payer: 59

## 2022-04-29 DIAGNOSIS — J069 Acute upper respiratory infection, unspecified: Secondary | ICD-10-CM | POA: Diagnosis not present

## 2022-04-29 NOTE — ED Provider Notes (Signed)
Roderic Palau    CSN: [id number] Arrival date & time: 04/29/22  [id number]      History   Chief Complaint Chief Complaint  Patient presents with   Sore Throat   Cough    HPI Courtney Barnett is a 32 y.o. female.   Patient presents for body aches, increased fatigue, sore throat, intermittent generalized headaches, productive cough and a sensation of heaviness to the chest present for 3 days.  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  Tolerating food and liquids.  Has attempted use of Tylenol Cold and flu.  Denies fevers, shortness of breath or wheezing.  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   Iron deficiency anemia 04/29/2020   Indication for care in labor and delivery, antepartum 02/01/2020   Nausea and vomiting of pregnancy, antepartum 07/16/2019   Rosacea 07/16/2019   Supervision of other normal pregnancy, antepartum 07/12/2019   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   Vasovagal syndrome 04/07/2016    Past Surgical History:  Procedure Laterality Date   BREAST SURGERY Left 03/2018   biopsy   CHOLECYSTECTOMY  12/20/2016   Procedure: LAPAROSCOPIC CHOLECYSTECTOMY WITH INTRAOPERATIVE CHOLANGIOGRAM;  Surgeon: Vickie Epley, MD;  Location: [facility] ORS;  Service: General;;   INTRAUTERINE DEVICE (IUD) INSERTION  09/2013   WISDOM TOOTH EXTRACTION      OB History     Gravida  3   Para  2   Term  2   Preterm      AB  1   Living  2      SAB  1   IAB      Ectopic      Multiple  0   Live Births  2            Home Medications    Prior to Admission medications   Medication Sig Start Date End Date Taking?  Authorizing Provider  levonorgestrel (MIRENA) 20 MCG/24HR IUD 1 each by Intrauterine route once.   Yes [provider]    Family History Family History  Problem Relation Age of Onset   Diabetes Maternal Grandfather    Colon cancer Neg Hx    Stomach cancer Neg Hx    Pancreatic cancer Neg Hx     Social History Social History   Tobacco Use   Smoking status: Never   Smokeless tobacco: Never  Vaping Use   Vaping Use: Never used  Substance Use Topics   Alcohol use: No   Drug use: No     Allergies   Patient has no known allergies.   Review of Systems Review of Systems   Physical Exam Triage Vital Signs ED Triage Vitals  Enc Vitals Group     BP 04/29/22 0959 103/72     Pulse Rate 04/29/22 0959 98     Resp 04/29/22 0959 16     Temp 04/29/22 0959 98.6 F (37 C)     Temp Source 04/29/22 0959 Oral     SpO2 04/29/22 0959 98 %     Weight --      Height --      Head Circumference --  Peak Flow --      Pain Score 04/29/22 1003 7     Pain Loc --      Pain Edu? --      Excl. in [place]? --    No data found.  Updated Vital Signs BP 103/72 (BP Location: Left Arm)   Pulse 98   Temp 98.6 F (37 C) (Oral)   Resp 16   LMP 04/15/2022 (Approximate)   SpO2 98%   Visual Acuity Right Eye Distance:   Left Eye Distance:   Bilateral Distance:    Right Eye Near:   Left Eye Near:    Bilateral Near:     Physical Exam Constitutional:      Appearance: Normal appearance. She is well-developed.  HENT:     Head: Normocephalic.     Right Ear: Tympanic membrane and ear canal normal.     Left Ear: Tympanic membrane and ear canal normal.     Nose: Congestion present. No rhinorrhea.     Mouth/Throat:     Mouth: Mucous membranes are moist.     Pharynx: No posterior oropharyngeal erythema.     Tonsils: No tonsillar exudate. 2+ on the right. 2+ on the left.  Eyes:     Extraocular Movements: Extraocular movements intact.  Cardiovascular:     Rate and Rhythm: Normal  rate and regular rhythm.     Heart sounds: Normal heart sounds.  Pulmonary:     Effort: Pulmonary effort is normal.     Breath sounds: Normal breath sounds.  Musculoskeletal:     Cervical back: Normal range of motion and neck supple.  Skin:    General: Skin is warm and dry.  Neurological:     General: No focal deficit present.     Mental Status: She is alert and oriented to person, place, and time.  Psychiatric:        Mood and Affect: Mood normal.        Behavior: Behavior normal.      UC Treatments / Results  Labs (all labs ordered are listed, but only abnormal results are displayed) Labs Reviewed - No data to display  EKG   Radiology No results found.  Procedures Procedures (including critical care time)  Medications Ordered in UC Medications - No data to display  Initial Impression / Assessment and Plan / UC Course  I have reviewed the triage vital signs and the nursing notes.  Pertinent labs & imaging results that were available during my care of the patient were reviewed by me and considered in my medical decision making (see chart for details).  Viral URI with cough  Patient is in no signs of distress nor toxic appearing.  Vital signs are stable.  Low suspicion for pneumonia, pneumothorax or bronchitis and therefore will defer imaging.  Declined COVID testing.  Based on appearance of throat will defer strep testing, discussed with patient, in agreement.  Tonsils are enlarged but endorses enlargement at baseline.May use additional over-the-counter medications as needed for supportive care.  May follow-up with urgent care as needed if symptoms persist or worsen.  Note given.   Final Clinical Impressions(s) / UC Diagnoses   Final diagnoses:  Viral URI with cough     Discharge Instructions      Your symptoms today are most likely being caused by a virus and should steadily improve in time it can take up to 7 to 10 days before you truly start to see a  turnaround however things will  get better    You can take Tylenol and/or Ibuprofen as needed for fever reduction and pain relief.   For cough: honey 1/2 to 1 teaspoon (you can dilute the honey in water or another fluid).  You can also use guaifenesin and dextromethorphan for cough. You can use a humidifier for chest congestion and cough.  If you don't have a humidifier, you can sit in the bathroom with the hot shower running.      For sore throat: try warm salt water gargles, cepacol lozenges, throat spray, warm tea or water with lemon/honey, popsicles or ice, or OTC cold relief medicine for throat discomfort.   For congestion: take a daily anti-histamine like Zyrtec, Claritin, and a oral decongestant, such as pseudoephedrine.  You can also use Flonase 1-2 sprays in each nostril daily.   It is important to stay hydrated: drink plenty of fluids (water, gatorade/powerade/pedialyte, juices, or teas) to keep your throat moisturized and help further relieve irritation/discomfort.    ED Prescriptions   None    PDMP not reviewed this encounter.   Hans Eden, NP 04/29/22 1028

## 2022-04-29 NOTE — Discharge Instructions (Signed)

## 2022-04-29 NOTE — ED Triage Notes (Signed)
Sore throat, headache, congestion, that started 3 days ago. Taking tylenol cold and flu with no relief of symptoms. 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

## 2022-05-25 IMAGING — US US MFM OB COMP +14 WKS
1 series · 14 of 28 positions shown · non-contrast
Comparison: none

[Series 1: us mfm ob comp +14 wks · 91 acquisitions, 14 frames shown]
[im 4/91]
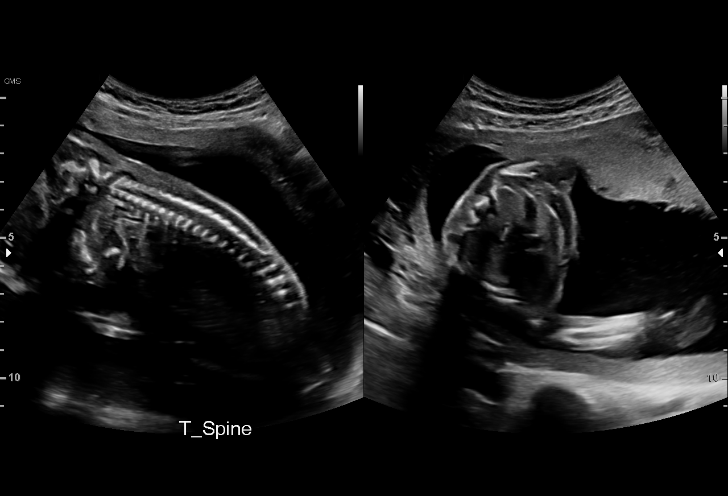
[im 11/91]
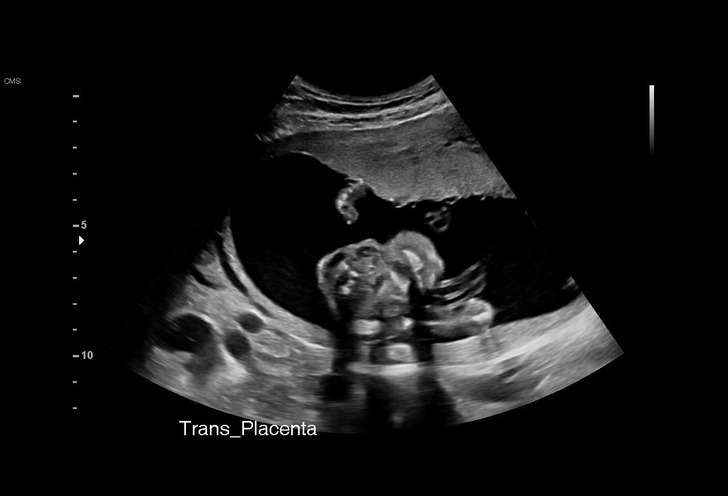
[im 17/91]
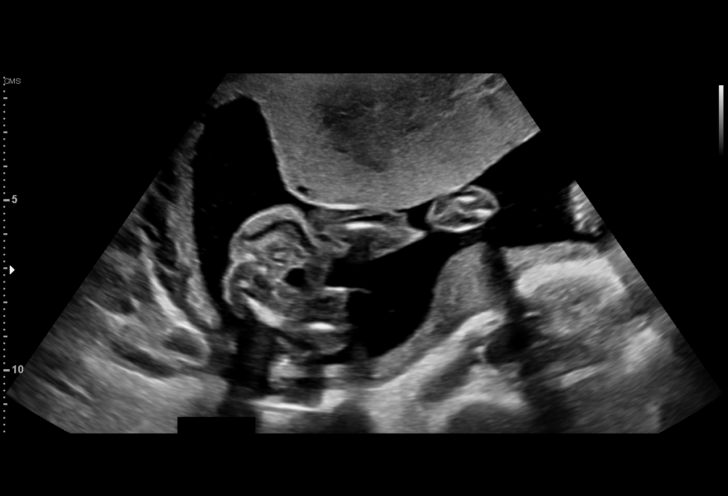
[im 24/91]
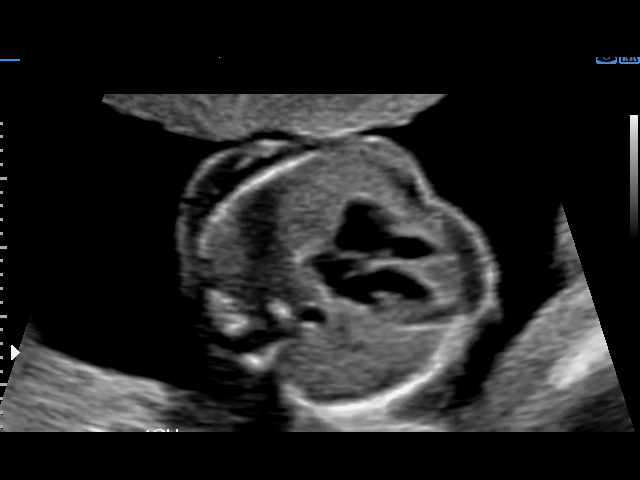
[im 31/91]
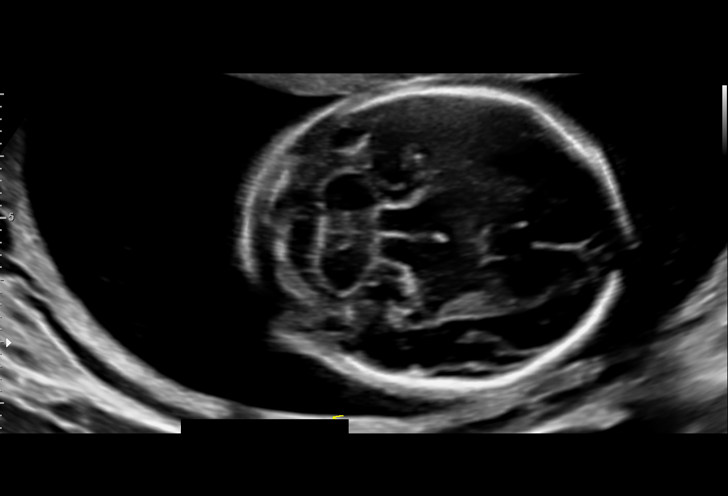
[im 37/91]
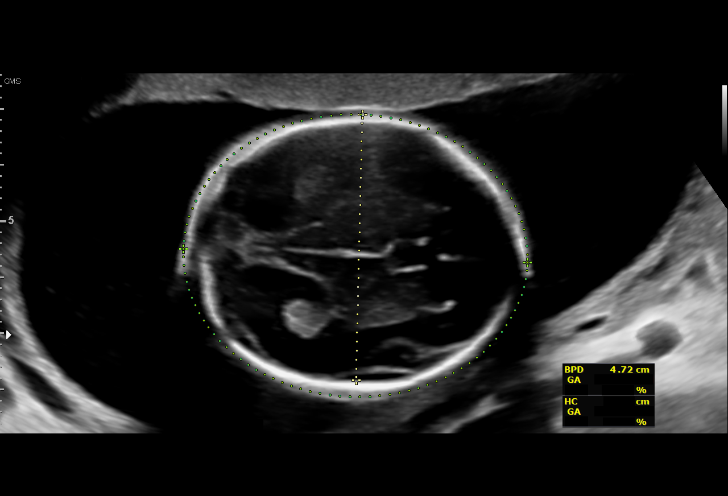
[im 44/91]
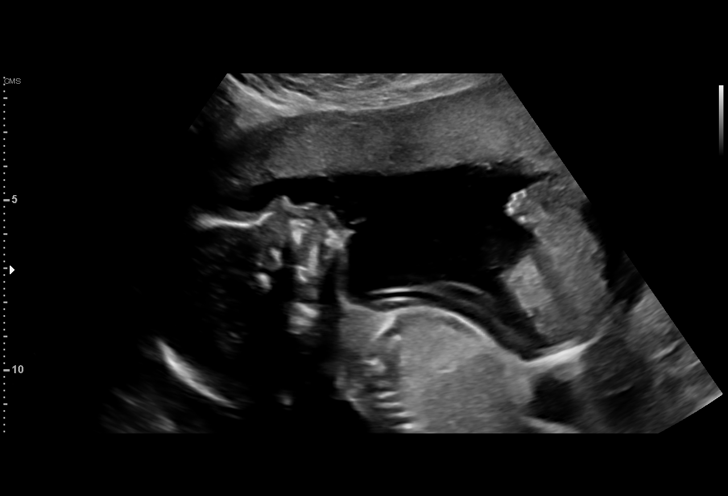
[im 51/91]
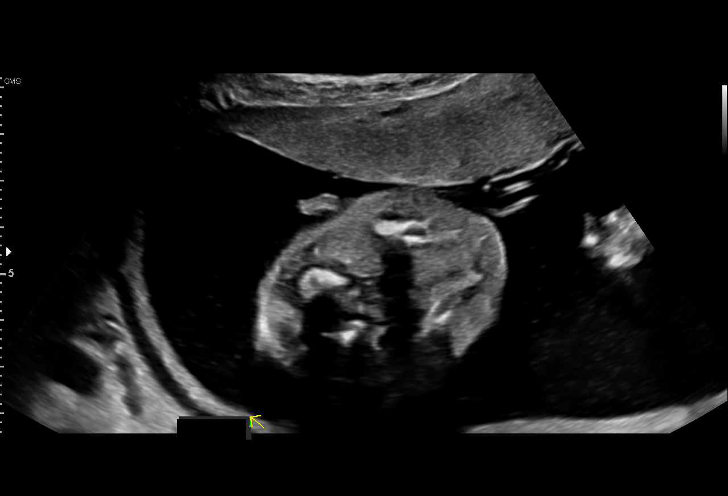
[im 57/91]
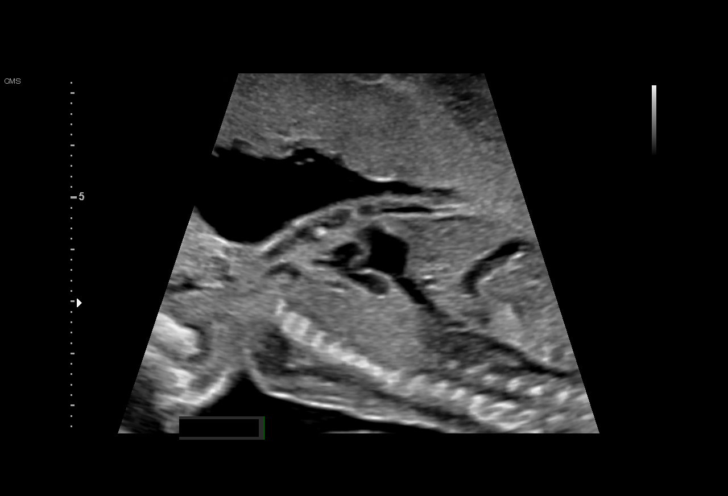
[im 64/91]
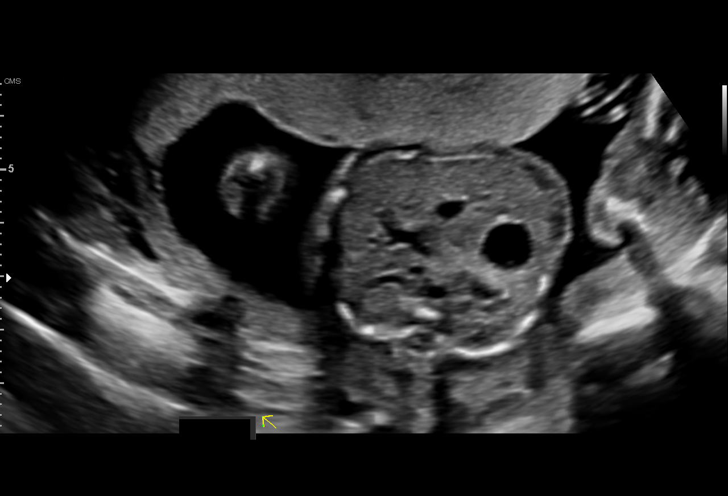
[im 71/91]
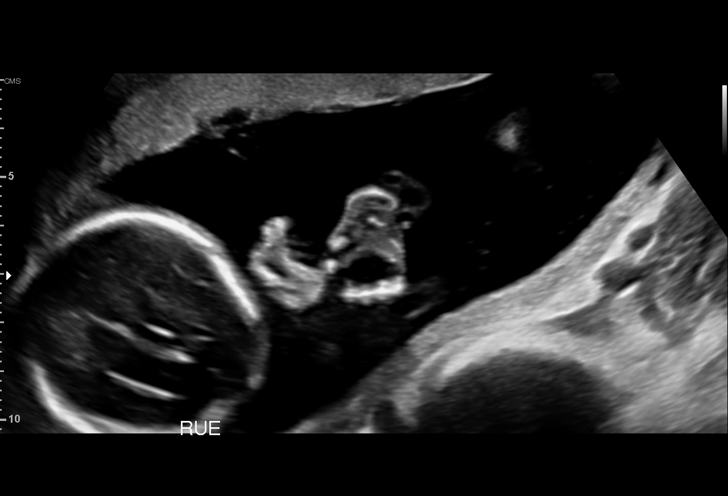
[im 77/91]
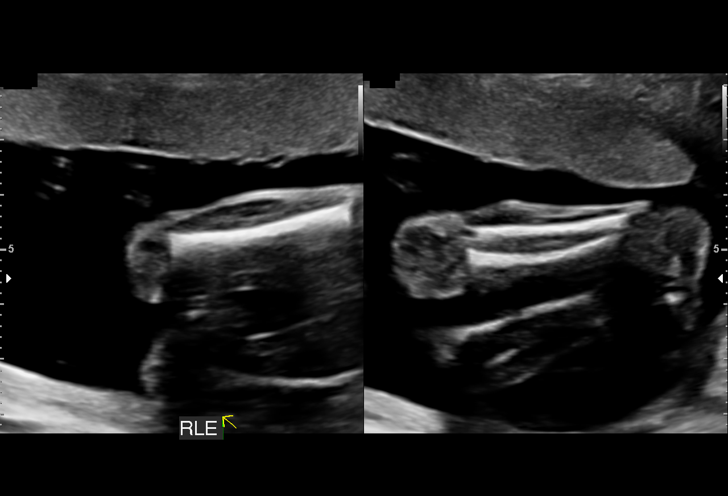
[im 84/91]
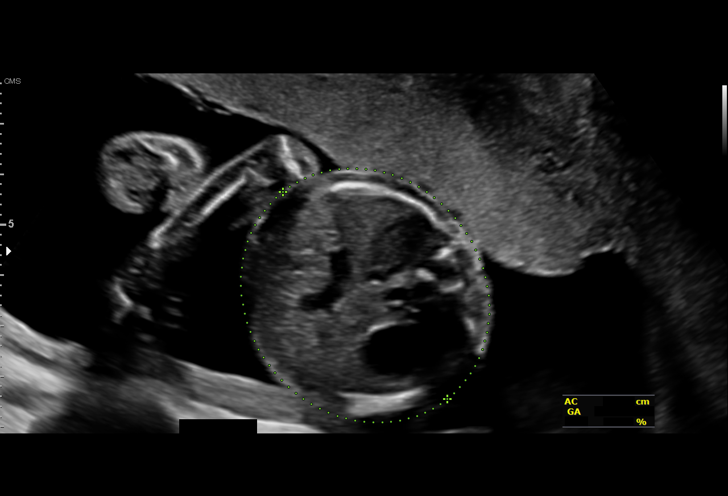
[im 91/91]
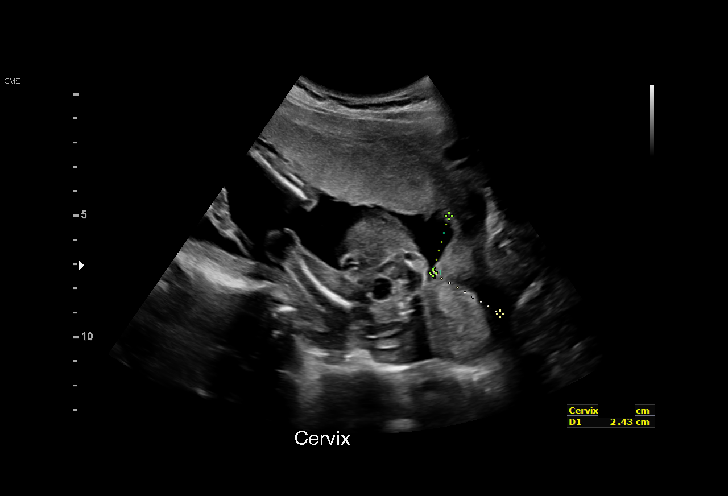

[14 of 28 positions shown; findings below may reference images not displayed]

1  US MFM OB COMP + 14 WK                76805.01    ESPIRIDION HOY

Indications

 19 weeks gestation of pregnancy
 Antenatal screening for malformations
Fetal Evaluation

 Num Of Fetuses:          1
 Fetal Heart Rate(bpm):   130
 Cardiac Activity:        Observed
 Presentation:            Breech
 Placenta:                Anterior
 P. Cord Insertion:       Visualized

 Amniotic Fluid
 AFI FV:      Within normal limits

                             Largest Pocket(cm)

Biometry

 BPD:      47.5  mm     G. Age:  20w 3d         71  %    CI:        73.67   %    70 - 86
                                                         FL/HC:       18.0  %    16.8 -
 HC:      175.8  mm     G. Age:  20w 1d         53  %    HC/AC:       1.13       1.09 -
 AC:      155.1  mm     G. Age:  20w 5d         72  %    FL/BPD:      66.5  %
 FL:       31.6  mm     G. Age:  19w 6d         41  %    FL/AC:       20.4  %    20 - 24
 HUM:        31  mm     G. Age:  20w 2d         64  %
 CER:      20.8  mm     G. Age:  19w 6d         69  %
 NFT:       5.6  mm

 LV:        8.3  mm
 CM:        4.3  mm
 Est. FW:     343   gm   0 lb 12 oz      70  %
OB History

 Gravidity:    3         Term:   1        Prem:   0        SAB:   1
 TOP:          0       Ectopic:  0        Living: 1
Gestational Age

 LMP:           19w 6d        Date:  04/26/19                 EDD:   01/31/20
 U/S Today:     20w 2d                                        EDD:   01/28/20
 Best:          19w 6d     Det. By:  LMP  (04/26/19)          EDD:   01/31/20
Anatomy

 Cranium:               Appears normal         LVOT:                   Appears normal
 Cavum:                 Appears normal         Aortic Arch:            Appears normal
 Ventricles:            Appears normal         Ductal Arch:            Appears normal
 Choroid Plexus:        Appears normal         Diaphragm:              Appears normal
 Cerebellum:            Appears normal         Stomach:                Appears normal, left
                                                                       sided
 Posterior Fossa:       Appears normal         Abdomen:                Appears normal
 Nuchal Fold:           Appears normal         Abdominal Wall:         Appears nml (cord
                                                                       insert, abd wall)
 Face:                  Appears normal         Cord Vessels:           Appears normal (3
                        (orbits and profile)                           vessel cord)
 Lips:                  Appears normal         Kidneys:                Appear normal
 Palate:                Appears normal         Bladder:                Appears normal
 Thoracic:              Appears normal         Spine:                  Appears normal
 Heart:                 Appears normal         Upper Extremities:      Appears normal
                        (4CH, axis, and
                        situs)
 RVOT:                  Appears normal         Lower Extremities:      Appears normal

 Other:  Feet/heels visualized. Nasal bone visualized.
Cervix Uterus Adnexa

 Cervix
 Length:           3.17  cm.
 Normal appearance by transabdominal scan.

 Uterus
 No abnormality visualized.

 Right Ovary
 Within normal limits.

 Left Ovary
 Within normal limits.

 Cul De Sac
 No free fluid seen.

 Adnexa
 No abnormality visualized.
Impression

 Single intrauterine pregnancy with measurements consistent
 with dates
 Normal anatomy with good amniotic fluid and fetal movement.
 Low risk NIPS and horizon noted.
Recommendations

 Follow up as clinically indicated.

## 2022-08-04 ENCOUNTER — Telehealth: Payer: 59 | Admitting: Physician Assistant

## 2022-08-04 DIAGNOSIS — A084 Viral intestinal infection, unspecified: Secondary | ICD-10-CM | POA: Diagnosis not present

## 2022-08-04 MED ORDER — ONDANSETRON 4 MG PO TBDP: 4.0000 mg | ORAL_TABLET | Freq: Three times a day (TID) | ORAL | 0 refills | Status: DC | PRN

## 2022-08-04 NOTE — Progress Notes (Signed)
E-Visit for Diarrhea  We are sorry that you are not feeling well.  Here is how we plan to help!  Based on what you have shared with me it looks like you have Acute Infectious Diarrhea.  Most cases of acute diarrhea are due to infections with virus and bacteria and are self-limited conditions lasting less than 14 days.  For your symptoms you may take Imodium 2 mg tablets that are over the counter at your local pharmacy. Take two tablet now and then one after each loose stool up to 6 a day.  Antibiotics are not needed for most people with diarrhea.  Optional: Zofran 4 mg 1 tablet every 8 hours as needed for nausea and vomiting   HOME CARE We recommend changing your diet to help with your symptoms for the next few days. Drink plenty of fluids that contain water salt and sugar. Sports drinks such as Gatorade may help.  You may try broths, soups, bananas, applesauce, soft breads, mashed potatoes or crackers.  You are considered infectious for as long as the diarrhea continues. Hand washing or use of alcohol based hand sanitizers is recommend. It is best to stay out of work or school until your symptoms stop.   GET HELP RIGHT AWAY If you have dark yellow colored urine or do not pass urine frequently you should drink more fluids.   If your symptoms worsen  If you feel like you are going to pass out (faint) You have a new problem  MAKE SURE YOU  Understand these instructions. Will watch your condition. Will get help right away if you are not doing well or get worse.  Thank you for choosing an e-visit.  Your e-visit answers were reviewed by a board certified advanced clinical practitioner to complete your personal care plan. Depending upon the condition, your plan could have included both over the counter or prescription medications.  Please review your pharmacy choice. Make sure the pharmacy is open so you can pick up prescription now. If there is a problem, you may contact your provider  through MyChart messaging and have the prescription routed to another pharmacy.  Your safety is important to us. If you have drug allergies check your prescription carefully.   For the next 24 hours you can use MyChart to ask questions about today's visit, request a non-urgent call back, or ask for a work or school excuse. You will get an email in the next two days asking about your experience. I hope that your e-visit has been valuable and will speed your recovery.  I have spent 5 minutes in review of e-visit questionnaire, review and updating patient chart, medical decision making and response to patient.   Aman Bonet M Glenis Musolf, PA-C  

## 2022-10-15 ENCOUNTER — Ambulatory Visit
Admission: EM | Admit: 2022-10-15 | Discharge: 2022-10-15 | Disposition: A | Discharge status: Home / Self Care | Payer: 59

## 2022-10-15 ENCOUNTER — Encounter: Payer: Self-pay | Admitting: *Deleted

## 2022-10-15 ENCOUNTER — Other Ambulatory Visit: Payer: Self-pay

## 2022-10-15 DIAGNOSIS — R200 Anesthesia of skin: Secondary | ICD-10-CM | POA: Insufficient documentation

## 2022-10-15 NOTE — ED Triage Notes (Signed)
Pt reports day 4 of RT arm pain. Pt woke this AM with worsening Rt arm pain starting at RT elbow to hand. Pt reports RT hand is tingling . Pt has photo on mobile showing color change to RT han this morning.

## 2022-10-15 NOTE — ED Provider Notes (Signed)
  [name]    CSN: [id number] Arrival date & time: 10/15/22  0906      History   Chief Complaint Chief Complaint  Patient presents with   Arm Pain    HPI Courtney Barnett is a 32 y.o. female.    Arm Pain  Patient here today with a 4-day history of right arm pain, seen post triage, patient reports that she has no feeling in any of her fingers, and her fingertips. Hand and fingertips have been cold throughout the morning.  Patient concern for nerve injury.  Denies any known injury.  No history of carpal tunnel.  Patient is complaining of neurovascular incompetence patient is being referred over to [facility]. On evaluation, there is no indication of any neurovascular compromise, however based on patients reported decreased sensation and pain in right arm without injury, specialty evaluation is warranted.   -Given quick assessment and inability to appropriately work patient up in the setting,  no charge for visit.   Bing Neighbors, NP 10/15/22 [phone]

## 2022-10-20 DIAGNOSIS — G5620 Lesion of ulnar nerve, unspecified upper limb: Secondary | ICD-10-CM | POA: Insufficient documentation

## 2022-11-10 ENCOUNTER — Ambulatory Visit: Payer: 59 | Admitting: Nurse Practitioner

## 2022-11-21 ENCOUNTER — Encounter: Payer: Self-pay | Admitting: Family Medicine

## 2022-11-21 ENCOUNTER — Ambulatory Visit (INDEPENDENT_AMBULATORY_CARE_PROVIDER_SITE_OTHER): Payer: 59 | Admitting: Family Medicine

## 2022-11-21 VITALS — BP 103/75 | HR 96 | Ht 63.0 in | Wt 115.4 lb

## 2022-11-21 DIAGNOSIS — Z111 Encounter for screening for respiratory tuberculosis: Secondary | ICD-10-CM | POA: Diagnosis not present

## 2022-11-21 DIAGNOSIS — R17 Unspecified jaundice: Secondary | ICD-10-CM | POA: Diagnosis not present

## 2022-11-21 DIAGNOSIS — H8110 Benign paroxysmal vertigo, unspecified ear: Secondary | ICD-10-CM | POA: Insufficient documentation

## 2022-11-21 NOTE — Assessment & Plan Note (Signed)
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  Highest was 4.5 around the time she had her gallbladder removed.  Since then that has been around 2.  No fractionated bilirubin that I have seen in the past.  Will repeat hepatic function panel and get fractionated bilirubin.  Further workup pending test results.

## 2022-11-21 NOTE — Assessment & Plan Note (Signed)
Patient has brief episodes of vertigo when she lays flat or tilts her head to 1 side.  These resolved spontaneously.  We discussed BPPV and how this was treated.  Discussed Epley maneuver and how this is performed.  Advised her if she would like [place] to do Dix-Hallpike and Epley maneuver in the office she will need to schedule a appointment in the future.  Patient also does not want to have this done today because she has to go to work after this appointment.

## 2022-11-21 NOTE — Patient Instructions (Addendum)
It was nice to see you today,  We addressed the following topics today: -I will check your bilirubin levels again.  If I need to do any further workup after I get these results I will let you know - The the symptoms you are describing are something called BPPV.  The treatment for this is a maneuver that we can teach you how to do and you can also do at home. - Schedule a visit for [place] to talk about the vertigo if you would like, otherwise follow-up in 1 year. - I have ordered a QuantiFERON gold test for your TB screening.  Have a great day,  Frederic Jericho, MD

## 2022-11-21 NOTE — Progress Notes (Signed)
   Annual physical  Subjective   Patient ID: Courtney Barnett, female    DOB: Feb 20, 1990  Age: 32 y.o. MRN: [id number]  Chief Complaint  Patient presents with   Annual Exam   HPI Courtney Barnett is a 32 y.o. old female here  for annual exam.   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  She is married, has 2 children ages 43 and 16.  She does not use tobacco alcohol or recreational drugs.  Eats a regular diet and does not exercise regularly.  She has a Mirena, has irregular periods due to this.  Patient has had issues with brief episodes of vertigo going back several years.  Usually when she lays completely flat for tilts her head to 1 side.  The symptoms go away quickly.  We discussed BPPV and how this is treated.  Patient says she has had a Pap smear recently but cannot remember when.  Thinks she had it performed by an OB in [place] when she was pregnant 2 years ago..   The ASCVD Risk score (Arnett DK, et al., 2019) failed to calculate for the following reasons:   The 2019 ASCVD risk score is only valid for ages 50 to 35  Health Maintenance Due  Topic Date Due   HPV VACCINES (2 - 2-dose series) 04/07/2006   Cervical Cancer Screening (HPV/Pap Cotest)  07/26/2021   COVID-19 Vaccine (3 - 2023-24 season) 10/16/2022      Objective:     BP 103/75   Pulse 96   Ht 5\' 3"  (1.6 m)   Wt 115 lb 6.4 oz (52.3 kg)   SpO2 100%   BMI 20.44 kg/m    Physical Exam General: Alert, oriented HEENT: PERRLA, EOMI, moist mucosa.Diagnosed. CV: Regular rate and rhythm no murmurs Pulmonary: Lungs clear bilaterally no wheeze or crackles GI: Soft, normal bowel sounds MSK normal strength bilaterally, normal gait Extremities: No pedal edema Psych: Pleasant affect   No results found for any visits on 11/21/22.      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  Highest was 4.5 around the time she had her gallbladder removed.  Since then that has been around 2.  No fractionated bilirubin that I have seen in the past.  Will repeat hepatic function panel and get fractionated bilirubin.  Further workup pending test results.   Orders: -     Hepatic function panel -     Bilirubin, direct  Screening-pulmonary TB -     QuantiFERON-TB Gold Plus  Benign paroxysmal positional vertigo, unspecified laterality Assessment & Plan: Patient has brief episodes of vertigo when she lays flat or tilts her head to 1 side.  These resolved spontaneously.  We discussed BPPV and how this was treated.  Discussed Epley maneuver and how this is performed.  Advised her if she would like [place] to do Dix-Hallpike and Epley maneuver in the office she will need to schedule a appointment in the future.  Patient also does not want to have this done today because she has to go to work after this appointment.      Return in about 1 year (around 11/21/2023) for physical.    Sandre Kitty, MD

## 2022-11-23 LAB — QUANTIFERON-TB GOLD PLUS
QuantiFERON Mitogen Value: >10 [IU]/mL
QuantiFERON Nil Value: 0.01 [IU]/mL
QuantiFERON TB1 Ag Value: 0.02 [IU]/mL
QuantiFERON TB2 Ag Value: 0.01 [IU]/mL
QuantiFERON-TB Gold Plus: NEGATIVE

## 2022-11-23 LAB — HEPATIC FUNCTION PANEL
ALT: 23 [IU]/L (ref 0–32)
AST: 20 [IU]/L (ref 0–40)
Albumin: 4.8 g/dL (ref 3.9–4.9)
Alkaline Phosphatase: 47 [IU]/L (ref 44–121)
Bilirubin Total: 1.9 mg/dL — ABNORMAL HIGH (ref 0.0–1.2)
Bilirubin, Direct: 0.37 mg/dL (ref 0.00–0.40)
Total Protein: 7.2 g/dL (ref 6.0–8.5)

## 2022-11-24 ENCOUNTER — Encounter: Payer: Self-pay | Admitting: Family Medicine

## 2022-11-28 ENCOUNTER — Other Ambulatory Visit: Payer: Self-pay | Admitting: Family Medicine

## 2022-11-28 DIAGNOSIS — R17 Unspecified jaundice: Secondary | ICD-10-CM

## 2022-11-30 ENCOUNTER — Other Ambulatory Visit: Payer: 59

## 2022-11-30 DIAGNOSIS — R17 Unspecified jaundice: Secondary | ICD-10-CM

## 2022-12-01 ENCOUNTER — Encounter: Payer: Self-pay | Admitting: Family Medicine

## 2022-12-01 LAB — LACTATE DEHYDROGENASE: LDH: 142 [IU]/L (ref 119–226)

## 2022-12-01 LAB — HAPTOGLOBIN: Haptoglobin: 36 mg/dL (ref 33–278)

## 2023-03-15 ENCOUNTER — Telehealth: Payer: 59 | Admitting: Physician Assistant

## 2023-03-15 DIAGNOSIS — A0811 Acute gastroenteropathy due to Norwalk agent: Secondary | ICD-10-CM | POA: Diagnosis not present

## 2023-03-15 MED ORDER — ONDANSETRON 4 MG PO TBDP: 4.0000 mg | ORAL_TABLET | Freq: Three times a day (TID) | ORAL | 0 refills | Status: DC | PRN

## 2023-03-15 NOTE — Progress Notes (Signed)
Based on what you have shared with me I am concerned for Norovirus.   I want you to stay well hydrated and try to get plenty of rest. I have prescribed Zofran to help resolve vomiting and calm nausea so you can stay better hydrated. You can use OTC Tylenol for headache or body aches. I recommend OTC Imodium to help slow frequency of loose stools. If at ay time you are still unable to keep hydrated, you need to be evaluated in person ASAP. We want you to stay home until making sure you are fever-free for 24 hours and symptoms steadily improving.   Norovirus Infection Norovirus infection causes inflammation in the stomach and intestines (gastroenteritis) and food poisoning. It is caused by exposure to a virus from a group of similar viruses called noroviruses. Norovirus spreads very easily from person to person (is very contagious). It often occurs in places where people are in close contact, such as schools, nursing homes, restaurants, and cruise ships. You can get it from food, water, surfaces, or other people who have the virus. Norovirus is also found in the stool (feces) or vomit of infected people. You can spread the infection as soon as you feel sick, and you may continue to be contagious after you recover. What are the causes? This condition is caused by contact with norovirus. You can catch norovirus if you: Eat or drink something that is contaminated with norovirus. Touch surfaces or objects that are contaminated with norovirus and then put your hand in or by your mouth or nose. Have direct contact with an infected person who may or may not still have symptoms. Share food, drink, or utensils with someone who is contagious with norovirus. What are the signs or symptoms? Symptoms usually begin within 12 hours to 2 days after you become infected. Most norovirus symptoms affect the digestive system.Symptoms may include: Nausea, vomiting, and diarrhea. Stomach  cramps. Fever. Chills. Headache. Muscle aches and tiredness. How is this diagnosed? This condition may be diagnosed based on: Your symptoms. A physical exam. A stool test. How is this treated? There is no specific treatment for norovirus. Most people get better without treatment in about 2 days. Young children, the elderly, and people who are already sick may take up to 6 days to recover. Follow these instructions at home:  Eating and drinking  Drink plenty of water to replace fluids that are lost through diarrhea and vomiting. This prevents dehydration. Drink enough fluid to keep your urine pale yellow. Drink clear fluids in small amounts as you are able. Clear fluids include water, ice chips, fruit juice with water added (diluted fruit juice), and low-calorie sports drinks. Avoid fluids that contain a lot of sugar or caffeine, such as energy drinks, sports drinks, and soda. Avoid alcohol. If instructed by your health care provider, drink an oral rehydration solution (ORS). This is a drink that is sold at pharmacies and retail stores. An ORS contains minerals (electrolytes) that you can lose through diarrhea and vomiting. Eat bland, easy-to-digest foods in small amounts as you are able. These foods include rice, lean meats, toast, and crackers. Avoid spicy or fatty foods. General instructions Rest at home while you recover. Do not prepare food for others while you are infected. Wait at least 3 days after you recover from the illness to do this. Take over-the-counter and prescription medicines only as told by your health care provider. Wash your hands frequently with soap and water for at least 20 seconds. Alcohol-based hand  sanitizer can be used in addition to soap and water, but sanitizer should not be the only cleansing method because it is not effective at removing norovirus from your hands or surfaces. Make sure that each person in your household washes his or her hands well and  often. Keep all follow-up visits. This is important. How is this prevented? To help prevent the spread of norovirus: Stay at home if you are feeling sick. This will reduce the risk of spreading the virus to others. Wash your hands often with soap and water for at least 20 seconds, especially after using the toilet, helping a child use the toilet, or changing a child's diaper. Wash fruits and vegetables thoroughly before peeling, preparing, or serving them. Throw out any food that a sick person may have touched. Disinfect contaminated surfaces immediately after someone in the household has been sick. Disinfect frequently used surfaces, such as counters, doorknobs, and faucets. Use a bleach-based household cleaner. Immediately remove and wash soiled clothes or sheets. Contact a health care provider if: You have vomiting, diarrhea, or stomach pain that gets worse. You have symptoms that do not go away after 3-6 days. You have a fever. You cannot drink without vomiting. You feel light-headed or dizzy. Your symptoms get worse. Get help right away if: You develop symptoms of dehydration that do not improve with fluid replacement, such as: Excessive sleepiness. Lack of tears. Very little urine production. Dry mouth. Muscle cramps. Weak pulse. Confusion. Summary Norovirus infection is common and often occurs in places where people are in close contact, such as schools, nursing homes, restaurants, and cruise ships. To help prevent the spread of this infection, wash hands with soap and water for at least 20 seconds before handling food or after having contact with stool or body fluids. There is no specific treatment for norovirus, but most people get better without treatment in about 2 days. People who are healthy when infected often recover sooner than those who are elderly, young, or already sick. Replace lost fluids by drinking plenty of water, or by drinking oral rehydration solution (ORS),  which contains important minerals called electrolytes. This prevents dehydration. This information is not intended to replace advice given to you by your health care provider. Make sure you discuss any questions you have with your health care provider. Document Revised: 09/09/2020 Document Reviewed: 09/09/2020 Elsevier Patient Education  2024 [employer].

## 2023-03-15 NOTE — Progress Notes (Signed)
I have spent 5 minutes in review of e-visit questionnaire, review and updating patient chart, medical decision making and response to patient.   Piedad Climes, PA-C

## 2023-03-15 NOTE — Progress Notes (Signed)
Message sent to patient requesting further input regarding current symptoms. Awaiting patient response.

## 2023-04-23 ENCOUNTER — Telehealth: Admitting: Family

## 2023-04-23 DIAGNOSIS — R112 Nausea with vomiting, unspecified: Secondary | ICD-10-CM

## 2023-04-23 MED ORDER — ONDANSETRON HCL 4 MG PO TABS: 4.0000 mg | ORAL_TABLET | Freq: Three times a day (TID) | ORAL | 0 refills | Status: DC | PRN

## 2023-04-23 NOTE — Progress Notes (Signed)
 E-Visit for Vomiting  We are sorry that you are not feeling well. Here is how we plan to help!  Based on what you have shared with me it looks like you have a Virus that is irritating your GI tract.  Vomiting is the forceful emptying of a portion of the stomach's content through the mouth.  Although nausea and vomiting can make you feel miserable, it's important to remember that these are not diseases, but rather symptoms of an underlying illness.  When we treat short term symptoms, we always caution that any symptoms that persist should be fully evaluated in a medical office.  I have prescribed a medication that will help alleviate your symptoms and allow you to stay hydrated:  Zofran 4 mg 1 tablet every 8 hours as needed for nausea and vomiting  HOME CARE: Drink clear liquids.  This is very important! Dehydration (the lack of fluid) can lead to a serious complication.  Start off with 1 tablespoon every 5 minutes for 8 hours. You may begin eating bland foods after 8 hours without vomiting.  Start with saltine crackers, white bread, rice, mashed potatoes, applesauce. After 48 hours on a bland diet, you may resume a normal diet. Try to go to sleep.  Sleep often empties the stomach and relieves the need to vomit.  GET HELP RIGHT AWAY IF:  Your symptoms do not improve or worsen within 2 days after treatment. You have a fever for over 3 days. You cannot keep down fluids after trying the medication.  MAKE SURE YOU:  Understand these instructions. Will watch your condition. Will get help right away if you are not doing well or get worse.   Thank you for choosing an e-visit.  Your e-visit answers were reviewed by a board certified advanced clinical practitioner to complete your personal care plan. Depending upon the condition, your plan could have included both over the counter or prescription medications.  Please review your pharmacy choice. Make sure the pharmacy is open so you can pick  up prescription now. If there is a problem, you may contact your provider through [employer] and have the prescription routed to another pharmacy.  Your safety is important to [place]. If you have drug allergies check your prescription carefully.   For the next 24 hours you can use MyChart to ask questions about today's visit, request a non-urgent call back, or ask for a work or school excuse. You will get an email in the next two days asking about your experience. I hope that your e-visit has been valuable and will speed your recovery.   Approximately 5 minutes was spent documenting and reviewing patient's chart.

## 2023-07-01 ENCOUNTER — Telehealth: Admitting: Emergency Medicine

## 2023-07-01 DIAGNOSIS — M79601 Pain in right arm: Secondary | ICD-10-CM | POA: Diagnosis not present

## 2023-07-01 DIAGNOSIS — M5412 Radiculopathy, cervical region: Secondary | ICD-10-CM | POA: Insufficient documentation

## 2023-07-01 NOTE — Progress Notes (Signed)
 Because you do not have back pain but instead have a nerve/arm problem that falls outside of the scope of this Evisit, I feel your condition warrants further evaluation and I recommend that you be seen in a face-to-face visit. I am sorry I cannot help you by evisit.   I suggest the [facility] in [place]. The website says they are open today until 9pm   NOTE: There will be NO CHARGE for this E-Visit   If you are having a true medical emergency, please call 911.     For an urgent face to face visit, [place] has multiple urgent care centers for your convenience.  Click the link below for the full list of locations and hours, walk-in wait times, appointment scheduling options and driving directions:  Urgent Care - [place], [place], [place], [place], [place], [place]       Your MyChart E-visit questionnaire answers were reviewed by a board certified advanced clinical practitioner to complete your personal care plan based on your specific symptoms.    Thank you for using e-Visits.

## 2023-10-03 ENCOUNTER — Telehealth: Payer: Self-pay | Admitting: *Deleted

## 2023-10-03 NOTE — Telephone Encounter (Signed)
 Contacted pt and appt scheduled.

## 2023-10-03 NOTE — Telephone Encounter (Signed)
 Copied from CRM [id number]. Topic: General - Other >> Oct 02, 2023  2:34 PM Debby BROCKS wrote: Reason for CRM: Patient needs TB testing done before Sept 13th for her on boarding.

## 2023-10-09 ENCOUNTER — Ambulatory Visit

## 2023-10-12 ENCOUNTER — Other Ambulatory Visit: Payer: Self-pay | Admitting: *Deleted

## 2023-10-12 DIAGNOSIS — Z111 Encounter for screening for respiratory tuberculosis: Secondary | ICD-10-CM

## 2023-10-17 ENCOUNTER — Other Ambulatory Visit

## 2023-10-17 DIAGNOSIS — Z111 Encounter for screening for respiratory tuberculosis: Secondary | ICD-10-CM

## 2023-11-21 ENCOUNTER — Encounter: Payer: 59 | Admitting: Family Medicine

## 2023-11-27 ENCOUNTER — Encounter: Admitting: Family Medicine

## 2023-12-22 ENCOUNTER — Ambulatory Visit

## 2023-12-22 ENCOUNTER — Ambulatory Visit: Payer: Self-pay | Admitting: *Deleted

## 2023-12-22 ENCOUNTER — Ambulatory Visit
Admission: EM | Admit: 2023-12-22 | Discharge: 2023-12-22 | Disposition: A | Discharge status: Home / Self Care | Attending: Family Medicine | Admitting: Family Medicine

## 2023-12-22 DIAGNOSIS — R000 Tachycardia, unspecified: Secondary | ICD-10-CM

## 2023-12-22 DIAGNOSIS — R42 Dizziness and giddiness: Secondary | ICD-10-CM | POA: Diagnosis not present

## 2023-12-22 NOTE — ED Triage Notes (Signed)
 Patient reports this morning having an increase in HR (episode) this morning in the 180's. During the episode I was in the bathroom getting ready, sitting down to standing, then it started, with dizziness at that time. I had this same episode back in September. History of Syncope no diagnosis of POTS but concerned with it. Called PCP who recommended ED but Urgent care was closer for me. No chest pain. No sob.

## 2023-12-22 NOTE — Discharge Instructions (Signed)
 Your EKG was normal  We have drawn blood to check your blood counts, electrolytes and sugar, and kidney function and thyroid function.  Staff will notify you if there is anything significantly abnormal  If you worsen in any way, please go to the emergency room  Please keep your upcoming appointment with your primary care

## 2023-12-22 NOTE — Telephone Encounter (Signed)
 FYI Only or Action Required?: FYI only for provider: appointment scheduled on 11/7.  Patient was last seen in primary care on 11/21/2022 by Chandra Toribio POUR, MD.  Called Nurse Triage reporting No chief complaint on file..  Symptoms began today.  Interventions attempted: Rest, hydration, or home remedies.  Symptoms are: stable.  Triage Disposition: See HCP Within 4 Hours (Or PCP Triage)  Patient/caregiver understands and will follow disposition?: Yes  Copied from CRM [id number]. Topic: Clinical - Red Word Triage >> Dec 22, 2023  8:42 AM Gustabo D wrote: Syncope recently having more pots symptoms says her heart rate dropps but recently it's been elavating Reason for Disposition  [1] Heart beating very rapidly (e.g., > 140 / minute) AND [2] NOT present now  (Exception: Only during exercise.)  Answer Assessment - Initial Assessment Questions 1. DESCRIPTION: Please describe your heart rate or heartbeat that you are having (e.g., fast/slow, regular/irregular, skipped or extra beats, palpitations)     Heart rate shuts up- feels like it is going to beat out of chest- this morning  187, lasting 7 min, felt faint 2. ONSET: When did it start? (e.g., minutes, hours, days)      6 months - 4 times during that time- this am 3. DURATION: How long does it last (e.g., seconds, minutes, hours)     7 minutes 4. PATTERN Does it come and go, or has it been constant since it started?  Does it get worse with exertion?   Are you feeling it now?     Comes and goes  6. HEART RATE: Can you tell me your heart rate? How many beats in 15 seconds?  Note: Not all patients can do this.       94 7. RECURRENT SYMPTOM: Have you ever had this before? If Yes, ask: When was the last time? and What happened that time?      no 8. CAUSE: What do you think is causing the palpitations?     unsure 9. CARDIAC HISTORY: Do you have any history of heart disease? (e.g., heart attack, angina, bypass  surgery, angioplasty, arrhythmia)      yes 10. OTHER SYMPTOMS: Do you have any other symptoms? (e.g., dizziness, chest pain, sweating, difficulty breathing)       No- dizzy spell yesterday  Protocols used: Heart Rate and Heartbeat Questions-A-AH

## 2023-12-22 NOTE — Telephone Encounter (Signed)
 Called the patient that was scheduled with our office to discuss the triage note that discussed about the patient's symptoms stating that her Heart rate was 187, and she was near Syncope. The patient stated that was true and that it has happened before and did occur this morning. After speaking with the provider that would be seeing the patient it was recommended to let the patient know that she would need to go to the ED due to her symptoms including heart rate of 187 and being near Syncope. Patient understood verbally.

## 2023-12-22 NOTE — ED Provider Notes (Signed)
 [facility]    CSN: [id number] Arrival date & time: 12/22/23  1018      History   Chief Complaint Chief Complaint  Patient presents with   Irregular Heart Beat    HPI Courtney Barnett is a 33 y.o. female.   HPI Here for tachycardia and dizziness that happened today.  This morning when she was getting ready for work, she had bent over.  When she stood up she felt dizzy and lightheaded and her heart rate went into the 180s.  The total episode with all the dizziness lasted about 6 minutes, though the palpitations or tachycardia was a good bit less.   No recent nausea or vomiting or diarrhea or fever.  She has had similar episodes in the past with fast heart rate and dizziness.  In the remote past she had been diagnosed with syncope due to bradycardia.  A long time ago she had been evaluated by cardiology and took a couple of medicines for it and was weaned off those without incident later.  She does note a little posterior headache today.  She states often after these near syncope or tachycardia spells she will have some headache and feel drained.  Last menstrual cycle was sometime ago as she has the Mirena  IUD.  She does have primary care and has an appointment to follow-up with them for November 25. 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   Numbness of hand 10/15/2022   Post-COVID syndrome 11/04/2020   Vasomotor rhinitis 11/04/2020   Encounter to establish care 04/29/2020   Other fatigue 04/29/2020   Generalized osteoarthritis 04/29/2020   Iron deficiency anemia 04/29/2020   Indication for care in  labor and delivery, antepartum 02/01/2020   Nausea and vomiting of pregnancy, antepartum 07/16/2019   Rosacea 07/16/2019   Supervision of other normal pregnancy, antepartum 07/12/2019   Vasovagal syndrome 04/07/2016   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    Past Surgical History:  Procedure Laterality Date   BREAST SURGERY Left 03/2018   biopsy   CHOLECYSTECTOMY  12/20/2016   Procedure: LAPAROSCOPIC CHOLECYSTECTOMY WITH INTRAOPERATIVE CHOLANGIOGRAM;  Surgeon: Nicholaus Selinda Birmingham, MD;  Location: [facility] ORS;  Service: General;;   INTRAUTERINE DEVICE (IUD) INSERTION  09/2013   WISDOM TOOTH EXTRACTION      OB History     Gravida  3   Para  2   Term  2   Preterm      AB  1   Living  2      SAB  1   IAB      Ectopic      Multiple  0   Live Births  2            Home Medications    Prior to Admission medications   Medication Sig Start Date End Date Taking? Authorizing Provider  levonorgestrel  (MIRENA ) 20 MCG/24HR IUD 1 each by Intrauterine route once.   Yes [provider]    Family History Family History  Problem Relation Age of Onset   Diabetes Maternal Grandfather    Colon cancer Neg Hx    Stomach cancer Neg Hx  Pancreatic cancer Neg Hx     Social History Social History   Tobacco Use   Smoking status: Never   Smokeless tobacco: Never  Vaping Use   Vaping status: Never Used  Substance Use Topics   Alcohol use: No   Drug use: No     Allergies   Patient has no known allergies.   Review of Systems Review of Systems   Physical Exam Triage Vital Signs ED Triage Vitals  Encounter Vitals Group     BP 12/22/23 1028 115/75     Girls Systolic BP Percentile --      Girls Diastolic BP Percentile --      Boys Systolic BP Percentile --      Boys Diastolic BP Percentile --      Pulse Rate 12/22/23 1028 98     Resp 12/22/23 1028 18     Temp 12/22/23 1028 98.3 F (36.8 C)     Temp Source 12/22/23 1028 Temporal     SpO2 12/22/23 1028 99 %      Weight 12/22/23 1029 115 lb (52.2 kg)     Height 12/22/23 1029 5' 3 (1.6 m)     Head Circumference --      Peak Flow --      Pain Score 12/22/23 1029 0     Pain Loc --      Pain Education --      Exclude from Growth Chart --    Orthostatic VS for the past 24 hrs:  BP- Lying Pulse- Lying BP- Standing at 0 minutes Pulse- Standing at 0 minutes  12/22/23 1042 110/73 94 115/72 120    Updated Vital Signs BP 115/75 (BP Location: Right Arm)   Pulse 98   Temp 98.3 F (36.8 C) (Temporal)   Resp 18   Ht 5' 3 (1.6 m)   Wt 52.2 kg   LMP  (LMP Unknown)   SpO2 99%   BMI 20.37 kg/m   Visual Acuity Right Eye Distance:   Left Eye Distance:   Bilateral Distance:    Right Eye Near:   Left Eye Near:    Bilateral Near:     Physical Exam Vitals reviewed.  Constitutional:      General: She is not in acute distress.    Appearance: She is not ill-appearing, toxic-appearing or diaphoretic.  HENT:     Mouth/Throat:     Mouth: Mucous membranes are moist.  Eyes:     Extraocular Movements: Extraocular movements intact.     Conjunctiva/sclera: Conjunctivae normal.     Pupils: Pupils are equal, round, and reactive to light.  Cardiovascular:     Rate and Rhythm: Normal rate and regular rhythm.     Heart sounds: No murmur heard. Pulmonary:     Effort: Pulmonary effort is normal.     Breath sounds: Normal breath sounds.  Musculoskeletal:     Cervical back: Neck supple.  Lymphadenopathy:     Cervical: No cervical adenopathy.  Skin:    Coloration: Skin is not jaundiced or pale.  Neurological:     General: No focal deficit present.     Mental Status: She is alert and oriented to person, place, and time.  Psychiatric:        Behavior: Behavior normal.      UC Treatments / Results  Labs (all labs ordered are listed, but only abnormal results are displayed) Labs Reviewed  BASIC METABOLIC PANEL WITH GFR  CBC  TSH    EKG  Radiology No results  found.  Procedures Procedures (including critical care time)  Medications Ordered in UC Medications - No data to display  Initial Impression / Assessment and Plan / UC Course  I have reviewed the triage vital signs and the nursing notes.  Pertinent labs & imaging results that were available during my care of the patient were reviewed by me and considered in my medical decision making (see chart for details).     EKG is normal with normal sinus rhythm.  Heart rate on the EKG is about 90.  CBC and BMP are drawn today.  If there is any significant abnormality staff will notify her.  She will keep her follow-up with primary care  If she worsens in any way, she will go to the emergency room for further evaluation Final Clinical Impressions(s) / UC Diagnoses   Final diagnoses:  Tachycardia  Dizziness     Discharge Instructions      Your EKG was normal  We have drawn blood to check your blood counts, electrolytes and sugar, and kidney function and thyroid function.  Staff will notify you if there is anything significantly abnormal  If you worsen in any way, please go to the emergency room  Please keep your upcoming appointment with your primary care     ED Prescriptions   None    PDMP not reviewed this encounter.   Vonna Sharlet POUR, MD 12/22/23 [phone]

## 2023-12-23 ENCOUNTER — Ambulatory Visit: Payer: Self-pay | Admitting: Family Medicine

## 2023-12-23 LAB — BASIC METABOLIC PANEL WITH GFR
BUN/Creatinine Ratio: 13 (ref 9–23)
BUN: 9 mg/dL (ref 6–20)
CO2: 20 mmol/L (ref 20–29)
Calcium: 9.7 mg/dL (ref 8.7–10.2)
Chloride: 101 mmol/L (ref 96–106)
Creatinine, Ser: 0.69 mg/dL (ref 0.57–1.00)
Glucose: 102 mg/dL — ABNORMAL HIGH (ref 70–99)
Potassium: 4.1 mmol/L (ref 3.5–5.2)
Sodium: 137 mmol/L (ref 134–144)
eGFR: 118 mL/min/1.73 (ref >59)

## 2023-12-23 LAB — CBC
Hematocrit: 39 % (ref 34.0–46.6)
Hemoglobin: 13 g/dL (ref 11.1–15.9)
MCH: 30.1 pg (ref 26.6–33.0)
MCHC: 33.3 g/dL (ref 31.5–35.7)
MCV: 90 fL (ref 79–97)
Platelets: 297 x10E3/uL (ref 150–450)
RBC: 4.32 x10E6/uL (ref 3.77–5.28)
RDW: 12 % (ref 11.7–15.4)
WBC: 6.8 x10E3/uL (ref 3.4–10.8)

## 2023-12-23 LAB — TSH: TSH: 1.07 u[IU]/mL (ref 0.450–4.500)

## 2024-01-09 ENCOUNTER — Ambulatory Visit: Admitting: Family Medicine

## 2024-01-09 ENCOUNTER — Encounter: Payer: Self-pay | Admitting: Family Medicine

## 2024-01-09 VITALS — BP 96/52 | HR 87 | Ht 63.0 in | Wt 114.1 lb

## 2024-01-09 DIAGNOSIS — R000 Tachycardia, unspecified: Secondary | ICD-10-CM

## 2024-01-09 DIAGNOSIS — I951 Orthostatic hypotension: Secondary | ICD-10-CM

## 2024-01-09 NOTE — Patient Instructions (Signed)
 It was nice to see you today,  We addressed the following topics today: - Please schedule a lab visit in the next week or two in the morning to have your blood drawn for the ordered tests. - Continue your current strategies for managing symptoms: getting up slowly, staying well-hydrated, using compression socks as needed, and finding a place to sit down if you feel an episode coming on. - I will send in a referral, likely to cardiology, to discuss further mgmt options.  I will let you know when it is sent in. Someone should call to schedule within a month after that.   - Please schedule a follow-up appointment for a physical in one to two months.  Have a great day,  Rolan Slain, MD

## 2024-01-09 NOTE — Progress Notes (Signed)
 Established Patient Office Visit  Subjective   Patient ID: Courtney Barnett, female    DOB: 1990-07-24  Age: 33 y.o. MRN: [id number]  Chief Complaint  Patient presents with   Heart Problem    HPI  Subjective - Follow-up for an ER/urgent care visit for dizziness. 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 - Reports a change in symptoms over the past 6 months, with 4 episodes of near-syncope. These episodes are triggered by orthostatic changes (bending down then standing up). - Instead of bradycardia, episodes are now characterized by tachycardia with heart rate up to 180 on a smartwatch. Feels dizzy and like they are going to pass out, but no loss of consciousness. - No new or increased stressors. - Denies taking any new medications or herbal supplements. - Reports consistent hydration, high electrolyte intake, and adding salt to diet as baseline management for syncope.  Medications Current: Mirena  IUD. Past: Fludrocortisone, a beta-blocker (possibly metoprolol), and midodrine for syncope, prescribed by a pediatric cardiologist in [place]  during teenage years. Medications were taken for 2 years and then weaned off.  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 PSH: None mentioned. FH: None mentioned. Social Hx: None mentioned.  ROS Constitutional: Denies fainting. Cardiovascular: Reports episodes of tachycardia, dizziness, and pre-syncope. Psychiatric: Denies increased stress.    The ASCVD Risk score (Arnett DK, et al., 2019) failed to calculate for the following reasons:   The 2019 ASCVD risk score is only valid for ages 2 to 27  Health Maintenance Due  Topic Date Due   Hepatitis B Vaccines 19-59 Average Risk (3 of 3 - 3-dose series) 01/26/1992   HPV VACCINES (2 - 2-dose series) 04/07/2006   Cervical Cancer Screening (HPV/Pap Cotest)   07/26/2021   Influenza Vaccine  Never done   COVID-19 Vaccine (3 - 2025-26 season) 10/16/2023      Objective:     BP (!) 96/52   Pulse 87   Ht 5' 3 (1.6 m)   Wt 114 lb 1.9 oz (51.8 kg)   LMP  (LMP Unknown)   SpO2 99%   BMI 20.22 kg/m    Physical Exam Gen: alert, oriented Pulm: no respiratory distress Psych: pleasant affect   No results found for any visits on 01/09/24.      Assessment & Plan:   Orthostatic syncope Assessment & Plan: History of vasovagal? (Pt unsure if was told it was vasovagal or orthostatic. Diagnosed during childhood) syncope diagnosed in adolescence, previously characterized by bradycardia (athough pt now unsure if bradycardia or tachycardia). Presentation has changed over the last 6 months to episodes of pre-syncope with significant tachycardia (up to 180 bpm) upon standing, without loss of consciousness. Recent urgent care visit for similar symptoms showed increaesed HR on going from supine to standing. The leading differential is orthostatic intolerance, possibly Postural Orthostatic Tachycardia Syndrome (POTS), given the significant heart rate increase without hypotension.  - Plan to check labs: Cortisol, Ferritin, BMP, CBC. - Refer to Cardiology for further evaluation and management.   - Will consider re-initiating Florinef if symptoms worsen or there is a long wait for specialist, but will defer starting midodrine. - Schedule a physical exam in 1-2 months to follow up on this issue.  Orders: -     Iron, TIBC and Ferritin Panel; Future -     Cortisol; Future -  Basic metabolic panel with GFR; Future -     CBC with Differential/Platelet; Future -     TSH; Future     Return in about 7 weeks (around 02/27/2024) for physical.    Courtney MARLA Slain, MD

## 2024-01-10 NOTE — Assessment & Plan Note (Addendum)
 History of vasovagal? (Pt unsure if was told it was vasovagal or orthostatic. Diagnosed during childhood) syncope diagnosed in adolescence, previously characterized by bradycardia (athough pt now unsure if bradycardia or tachycardia). Presentation has changed over the last 6 months to episodes of pre-syncope with significant tachycardia (up to 180 bpm) upon standing, without loss of consciousness. Recent urgent care visit for similar symptoms showed increaesed HR on going from supine to standing. The leading differential is orthostatic intolerance, possibly Postural Orthostatic Tachycardia Syndrome (POTS), given the significant heart rate increase without hypotension.  - Plan to check labs: Cortisol, Ferritin, BMP, CBC. - Refer to Cardiology for further evaluation and management.   - Will consider re-initiating Florinef if symptoms worsen or there is a long wait for specialist, but will defer starting midodrine. - Schedule a physical exam in 1-2 months to follow up on this issue.

## 2024-01-18 ENCOUNTER — Other Ambulatory Visit

## 2024-01-18 DIAGNOSIS — I951 Orthostatic hypotension: Secondary | ICD-10-CM

## 2024-01-19 ENCOUNTER — Ambulatory Visit: Payer: Self-pay | Admitting: Family Medicine

## 2024-01-19 LAB — BASIC METABOLIC PANEL WITH GFR
BUN/Creatinine Ratio: 16 (ref 9–23)
BUN: 11 mg/dL (ref 6–20)
CO2: 24 mmol/L (ref 20–29)
Calcium: 9.4 mg/dL (ref 8.7–10.2)
Chloride: 102 mmol/L (ref 96–106)
Creatinine, Ser: 0.7 mg/dL (ref 0.57–1.00)
Glucose: 83 mg/dL (ref 70–99)
Potassium: 4 mmol/L (ref 3.5–5.2)
Sodium: 143 mmol/L (ref 134–144)
eGFR: 118 mL/min/1.73 (ref >59)

## 2024-01-19 LAB — CBC WITH DIFFERENTIAL/PLATELET
Basophils Absolute: 0 x10E3/uL (ref 0.0–0.2)
Basos: 1 %
EOS (ABSOLUTE): 0.1 x10E3/uL (ref 0.0–0.4)
Eos: 2 %
Hematocrit: 36.7 % (ref 34.0–46.6)
Hemoglobin: 12.1 g/dL (ref 11.1–15.9)
Immature Grans (Abs): 0 x10E3/uL (ref 0.0–0.1)
Immature Granulocytes: 0 %
Lymphocytes Absolute: 1.1 x10E3/uL (ref 0.7–3.1)
Lymphs: 33 %
MCH: 29.7 pg (ref 26.6–33.0)
MCHC: 33 g/dL (ref 31.5–35.7)
MCV: 90 fL (ref 79–97)
Monocytes Absolute: 0.3 x10E3/uL (ref 0.1–0.9)
Monocytes: 9 %
Neutrophils Absolute: 1.9 x10E3/uL (ref 1.4–7.0)
Neutrophils: 55 %
Platelets: 221 x10E3/uL (ref 150–450)
RBC: 4.08 x10E6/uL (ref 3.77–5.28)
RDW: 11.8 % (ref 11.7–15.4)
WBC: 3.4 x10E3/uL (ref 3.4–10.8)

## 2024-01-19 LAB — IRON,TIBC AND FERRITIN PANEL
Ferritin: 47 ng/mL (ref 15–150)
Iron Saturation: 18 % (ref 15–55)
Iron: 55 ug/dL (ref 27–159)
Total Iron Binding Capacity: 299 ug/dL (ref 250–450)
UIBC: 244 ug/dL (ref 131–425)

## 2024-01-19 LAB — TSH: TSH: 0.837 u[IU]/mL (ref 0.450–4.500)

## 2024-01-19 LAB — CORTISOL: Cortisol: 6.5 ug/dL (ref 6.2–19.4)

## 2024-02-16 ENCOUNTER — Telehealth: Admitting: Physician Assistant

## 2024-02-16 DIAGNOSIS — J019 Acute sinusitis, unspecified: Secondary | ICD-10-CM

## 2024-02-16 DIAGNOSIS — B9689 Other specified bacterial agents as the cause of diseases classified elsewhere: Secondary | ICD-10-CM

## 2024-02-16 MED ORDER — AMOXICILLIN-POT CLAVULANATE 875-125 MG PO TABS: 1.0000 | ORAL_TABLET | Freq: Two times a day (BID) | ORAL | 0 refills | Status: AC

## 2024-02-16 NOTE — Progress Notes (Signed)

## 2024-02-22 ENCOUNTER — Telehealth: Admitting: Physician Assistant

## 2024-02-22 DIAGNOSIS — B9689 Other specified bacterial agents as the cause of diseases classified elsewhere: Secondary | ICD-10-CM

## 2024-02-22 MED ORDER — DOXYCYCLINE HYCLATE 100 MG PO TABS: 100.0000 mg | ORAL_TABLET | Freq: Two times a day (BID) | ORAL | 0 refills | Status: AC

## 2024-02-22 NOTE — Progress Notes (Signed)
"      E-Visit for Sinus Problems  We are sorry that you are not feeling well.  Here is how we plan to help!  Based on what you have shared with me it looks like you have sinusitis.  Sinusitis is inflammation and infection in the sinus cavities of the head.  Based on your presentation I believe you most likely have Acute Bacterial Sinusitis.  This is an infection caused by bacteria and is treated with antibiotics.  Stop the Augmenting and start the Doxycyline I have sent in for you.    You may use an oral decongestant such as Mucinex D or if you have glaucoma or high blood pressure use plain Mucinex. Saline nasal spray help and can safely be used as often as needed for congestion.  If you develop worsening sinus pain, fever or notice severe headache and vision changes, or if symptoms are not better after completion of antibiotic, please schedule an appointment with a health care provider.    Sinus infections are not as easily transmitted as other respiratory infection, however we still recommend that you avoid close contact with loved ones, especially the very young and elderly.  Remember to wash your hands thoroughly throughout the day as this is the number one way to prevent the spread of infection!  Home Care: Only take medications as instructed by your medical team. Complete the entire course of an antibiotic. Do not take these medications with alcohol. A steam or ultrasonic humidifier can help congestion.  You can place a towel over your head and breathe in the steam from hot water coming from a faucet. Avoid close contacts especially the very young and the elderly. Cover your mouth when you cough or sneeze. Always remember to wash your hands.  Get Help Right Away If: You develop worsening fever or sinus pain. You develop a severe head ache or visual changes. Your symptoms persist after you have completed your treatment plan.  Make sure you Understand these instructions. Will watch your  condition. Will get help right away if you are not doing well or get worse.  Your e-visit answers were reviewed by a board certified advanced clinical practitioner to complete your personal care plan.  Depending on the condition, your plan could have included both over the counter or prescription medications.  If there is a problem please reply  once you have received a response from your provider.  Your safety is important to us .  If you have drug allergies check your prescription carefully.    You can use MyChart to ask questions about todays visit, request a non-urgent call back, or ask for a work or school excuse for 24 hours related to this e-Visit. If it has been greater than 24 hours you will need to follow up with your provider, or enter a new e-Visit to address those concerns.  You will get an e-mail in the next two days asking about your experience.  I hope that your e-visit has been valuable and will speed your recovery. Thank you for using e-visits.  I have spent 5 minutes in review of e-visit questionnaire, review and updating patient chart, medical decision making and response to patient.   Elsie Velma Lunger, PA-C     "

## 2024-02-27 ENCOUNTER — Encounter: Admitting: Family Medicine

## 2024-03-05 ENCOUNTER — Telehealth: Payer: Self-pay

## 2024-03-05 NOTE — Telephone Encounter (Signed)
 Called patient LVM to contact the office if pt calls please advise about the Flu vaccine

## 2024-03-08 NOTE — Telephone Encounter (Signed)
 Pt called to report that she already received her flu vaccine at [facility].

## 2024-03-17 NOTE — Progress Notes (Unsigned)
" °  Cardiology Office Note:  .   Date:  03/17/2024  ID:  Duwaine Herold Grass, DOB 1990-06-20, MRN [id number] PCP: Chandra Toribio POUR, MD  [facility] Providers Cardiologist:  None   History of Present Illness: .   No chief complaint on file.   Courtney Barnett is a 34 y.o. female with below history who presents for the evaluation of dizziness/tachycardia at the request of Chandra Toribio POUR, MD.   History of Present Illness               Problem List Vasovagal syncope     ROS: All other ROS reviewed and negative. Pertinent positives noted in the HPI.     Studies Reviewed: SABRA       Physical Exam:   VS:  There were no vitals taken for this visit.   Wt Readings from Last 3 Encounters:  01/09/24 114 lb 1.9 oz (51.8 kg)  12/22/23 115 lb (52.2 kg)  11/21/22 115 lb 6.4 oz (52.3 kg)    GEN: Well nourished, well developed in no acute distress NECK: No JVD; No carotid bruits CARDIAC: ***RRR, no murmurs, rubs, gallops RESPIRATORY:  Clear to auscultation without rales, wheezing or rhonchi  ABDOMEN: Soft, non-tender, non-distended EXTREMITIES:  No edema; No deformity  ASSESSMENT AND PLAN: .   Assessment and Plan                 {Are you ordering a CV Procedure (e.g. stress test, cath, DCCV, TEE, etc)?   Press F2        :[id number]}   Follow-up: No follow-ups on file.  Signed, Darryle DASEN. Barbaraann, MD, [facility]  [facility]  [address] [place], [place] [address] [phone]  5:56 PM   "

## 2024-03-19 ENCOUNTER — Ambulatory Visit (HOSPITAL_BASED_OUTPATIENT_CLINIC_OR_DEPARTMENT_OTHER): Admitting: Cardiovascular Disease

## 2024-03-19 DIAGNOSIS — R42 Dizziness and giddiness: Secondary | ICD-10-CM

## 2024-03-19 DIAGNOSIS — R000 Tachycardia, unspecified: Secondary | ICD-10-CM

## 2024-03-25 ENCOUNTER — Encounter: Admitting: Family Medicine

## 2024-04-24 ENCOUNTER — Ambulatory Visit (HOSPITAL_BASED_OUTPATIENT_CLINIC_OR_DEPARTMENT_OTHER): Admitting: Cardiovascular Disease
